# Patient Record
Sex: Male | Born: 1945 | Race: White | Hispanic: No | Smoking: Current some day smoker
Health system: Southern US, Community
[De-identification: ages and names within clinical notes are randomized; demographics above are authoritative.]

## PROBLEM LIST (undated history)

## (undated) DIAGNOSIS — F419 Anxiety disorder, unspecified: Secondary | ICD-10-CM

## (undated) DIAGNOSIS — E785 Hyperlipidemia, unspecified: Secondary | ICD-10-CM

## (undated) DIAGNOSIS — F32A Depression, unspecified: Secondary | ICD-10-CM

## (undated) DIAGNOSIS — F329 Major depressive disorder, single episode, unspecified: Secondary | ICD-10-CM

## (undated) DIAGNOSIS — H9190 Unspecified hearing loss, unspecified ear: Secondary | ICD-10-CM

## (undated) DIAGNOSIS — I10 Essential (primary) hypertension: Secondary | ICD-10-CM

## (undated) HISTORY — DX: Essential (primary) hypertension: I10

## (undated) HISTORY — DX: Hyperlipidemia, unspecified: E78.5

## (undated) HISTORY — PX: OTHER SURGICAL HISTORY: SHX169

## (undated) HISTORY — PX: CHOLECYSTECTOMY: SHX55

## (undated) HISTORY — PX: SPLENECTOMY: SUR1306

## (undated) HISTORY — PX: HERNIA REPAIR: SHX51

## (undated) HISTORY — PX: COLONOSCOPY: SHX174

---

## 1998-03-27 ENCOUNTER — Encounter: Admission: RE | Admit: 1998-03-27 | Discharge: 1998-03-27 | Payer: Self-pay | Admitting: Sports Medicine

## 1998-06-08 ENCOUNTER — Encounter: Admission: RE | Admit: 1998-06-08 | Discharge: 1998-06-08 | Payer: Self-pay | Admitting: Family Medicine

## 1999-03-20 ENCOUNTER — Encounter: Admission: RE | Admit: 1999-03-20 | Discharge: 1999-03-20 | Payer: Self-pay | Admitting: Family Medicine

## 1999-04-18 ENCOUNTER — Encounter: Admission: RE | Admit: 1999-04-18 | Discharge: 1999-04-18 | Payer: Self-pay | Admitting: Family Medicine

## 2000-10-14 ENCOUNTER — Encounter: Payer: Self-pay | Admitting: Emergency Medicine

## 2000-10-14 ENCOUNTER — Inpatient Hospital Stay (HOSPITAL_COMMUNITY): Admission: EM | Admit: 2000-10-14 | Discharge: 2000-10-18 | Payer: Self-pay | Admitting: Internal Medicine

## 2000-10-17 ENCOUNTER — Encounter: Payer: Self-pay | Admitting: Internal Medicine

## 2004-03-03 ENCOUNTER — Emergency Department (HOSPITAL_COMMUNITY): Admission: EM | Admit: 2004-03-03 | Discharge: 2004-03-03 | Payer: Self-pay | Admitting: *Deleted

## 2005-01-13 ENCOUNTER — Ambulatory Visit: Payer: Self-pay | Admitting: Internal Medicine

## 2005-01-23 ENCOUNTER — Ambulatory Visit: Payer: Self-pay

## 2005-02-13 ENCOUNTER — Ambulatory Visit: Payer: Self-pay | Admitting: Internal Medicine

## 2007-04-05 ENCOUNTER — Ambulatory Visit: Payer: Self-pay | Admitting: Internal Medicine

## 2007-04-05 LAB — CONVERTED CEMR LAB
BUN: 9 mg/dL (ref 6–23)
Basophils Absolute: 0 10*3/uL (ref 0.0–0.1)
Basophils Relative: 0.1 % (ref 0.0–1.0)
CO2: 35 meq/L — ABNORMAL HIGH (ref 19–32)
Calcium: 9.2 mg/dL (ref 8.4–10.5)
Chloride: 101 meq/L (ref 96–112)
Cholesterol: 221 mg/dL (ref 0–200)
Creatinine, Ser: 1.1 mg/dL (ref 0.4–1.5)
Direct LDL: 138.1 mg/dL
Eosinophils Absolute: 0.4 10*3/uL (ref 0.0–0.6)
Eosinophils Relative: 3.8 % (ref 0.0–5.0)
GFR calc Af Amer: 88 mL/min
GFR calc non Af Amer: 72 mL/min
Glucose, Bld: 102 mg/dL — ABNORMAL HIGH (ref 70–99)
HCT: 46 % (ref 39.0–52.0)
HDL: 24.9 mg/dL — ABNORMAL LOW (ref >39.0)
Hemoglobin: 15.5 g/dL (ref 13.0–17.0)
Lymphocytes Relative: 29 % (ref 12.0–46.0)
MCHC: 33.6 g/dL (ref 30.0–36.0)
MCV: 89.7 fL (ref 78.0–100.0)
Monocytes Absolute: 1.4 10*3/uL — ABNORMAL HIGH (ref 0.2–0.7)
Monocytes Relative: 13.2 % — ABNORMAL HIGH (ref 3.0–11.0)
Neutro Abs: 5.7 10*3/uL (ref 1.4–7.7)
Neutrophils Relative %: 53.9 % (ref 43.0–77.0)
Platelets: 398 10*3/uL (ref 150–400)
Potassium: 3.7 meq/L (ref 3.5–5.1)
RBC: 5.13 M/uL (ref 4.22–5.81)
RDW: 13.9 % (ref 11.5–14.6)
Sodium: 143 meq/L (ref 135–145)
TSH: 0.77 microintl units/mL (ref 0.35–5.50)
Total CHOL/HDL Ratio: 8.9
Triglycerides: 248 mg/dL (ref 0–149)
VLDL: 50 mg/dL — ABNORMAL HIGH (ref 0–40)
WBC: 10.6 10*3/uL — ABNORMAL HIGH (ref 4.5–10.5)

## 2007-10-30 ENCOUNTER — Emergency Department (HOSPITAL_COMMUNITY): Admission: EM | Admit: 2007-10-30 | Discharge: 2007-10-30 | Payer: Self-pay | Admitting: Emergency Medicine

## 2007-11-05 ENCOUNTER — Ambulatory Visit: Payer: Self-pay | Admitting: Internal Medicine

## 2007-11-19 ENCOUNTER — Ambulatory Visit: Payer: Self-pay

## 2008-08-17 ENCOUNTER — Ambulatory Visit: Payer: Self-pay | Admitting: Internal Medicine

## 2008-09-21 ENCOUNTER — Ambulatory Visit: Payer: Self-pay | Admitting: Internal Medicine

## 2008-09-21 LAB — CONVERTED CEMR LAB
BUN: 13 mg/dL (ref 6–23)
CO2: 34 meq/L — ABNORMAL HIGH (ref 19–32)
Calcium: 8.8 mg/dL (ref 8.4–10.5)
Chloride: 99 meq/L (ref 96–112)
Creatinine, Ser: 1.3 mg/dL (ref 0.4–1.5)
GFR calc Af Amer: 72 mL/min
GFR calc non Af Amer: 59 mL/min
Glucose, Bld: 95 mg/dL (ref 70–99)
Potassium: 3.8 meq/L (ref 3.5–5.1)
Sodium: 139 meq/L (ref 135–145)

## 2008-11-30 ENCOUNTER — Ambulatory Visit: Payer: Self-pay | Admitting: Internal Medicine

## 2008-11-30 LAB — CONVERTED CEMR LAB
AST: 18 units/L (ref 0–37)
LDL Cholesterol: 66 mg/dL (ref 0–99)
VLDL: 17 mg/dL (ref 0–40)

## 2009-03-21 ENCOUNTER — Encounter: Payer: Self-pay | Admitting: Internal Medicine

## 2009-05-01 DIAGNOSIS — I428 Other cardiomyopathies: Secondary | ICD-10-CM | POA: Insufficient documentation

## 2009-05-01 DIAGNOSIS — E785 Hyperlipidemia, unspecified: Secondary | ICD-10-CM | POA: Insufficient documentation

## 2009-05-01 DIAGNOSIS — I10 Essential (primary) hypertension: Secondary | ICD-10-CM

## 2009-05-03 ENCOUNTER — Ambulatory Visit: Payer: Self-pay | Admitting: Internal Medicine

## 2009-08-27 ENCOUNTER — Encounter (INDEPENDENT_AMBULATORY_CARE_PROVIDER_SITE_OTHER): Payer: Self-pay | Admitting: *Deleted

## 2009-11-23 ENCOUNTER — Ambulatory Visit: Payer: Self-pay | Admitting: Internal Medicine

## 2009-11-23 DIAGNOSIS — F17200 Nicotine dependence, unspecified, uncomplicated: Secondary | ICD-10-CM

## 2009-12-10 LAB — CONVERTED CEMR LAB
Chloride: 101 meq/L (ref 96–112)
HCT: 49.1 % (ref 39.0–52.0)
HDL: 34 mg/dL — ABNORMAL LOW (ref >39)
LDL Cholesterol: 49 mg/dL (ref 0–99)
Lymphocytes Relative: 37 % (ref 12–46)
Lymphs Abs: 4 10*3/uL (ref 0.7–4.0)
Neutrophils Relative %: 44 % (ref 43–77)
Platelets: 370 10*3/uL (ref 150–400)
Potassium: 4.8 meq/L (ref 3.5–5.3)
Sodium: 142 meq/L (ref 135–145)
Triglycerides: 257 mg/dL — ABNORMAL HIGH (ref <150)
VLDL: 51 mg/dL — ABNORMAL HIGH (ref 0–40)
WBC: 10.8 10*3/uL — ABNORMAL HIGH (ref 4.0–10.5)

## 2009-12-27 ENCOUNTER — Telehealth: Payer: Self-pay | Admitting: Internal Medicine

## 2010-03-29 ENCOUNTER — Encounter (INDEPENDENT_AMBULATORY_CARE_PROVIDER_SITE_OTHER): Payer: Self-pay | Admitting: *Deleted

## 2010-07-11 ENCOUNTER — Ambulatory Visit: Payer: Self-pay | Admitting: Internal Medicine

## 2010-11-03 ENCOUNTER — Encounter: Payer: Self-pay | Admitting: Internal Medicine

## 2010-11-10 LAB — CONVERTED CEMR LAB
Albumin: 4.4 g/dL (ref 3.5–5.2)
BUN: 13 mg/dL (ref 6–23)
Bilirubin, Direct: 0.2 mg/dL (ref 0.0–0.3)
CO2: 30 meq/L (ref 19–32)
Calcium: 8.9 mg/dL (ref 8.4–10.5)
Creatinine, Ser: 1.2 mg/dL (ref 0.4–1.5)
Digitoxin Lvl: 0.2 ng/mL — ABNORMAL LOW (ref 0.8–2.0)
Eosinophils Relative: 3.3 % (ref 0.0–5.0)
HCT: 48.9 % (ref 39.0–52.0)
Lymphs Abs: 2.4 10*3/uL (ref 0.7–4.0)
MCV: 94.3 fL (ref 78.0–100.0)
Monocytes Absolute: 1.1 10*3/uL — ABNORMAL HIGH (ref 0.1–1.0)
Neutro Abs: 5.7 10*3/uL (ref 1.4–7.7)
Platelets: 306 10*3/uL (ref 150.0–400.0)
RDW: 14.5 % (ref 11.5–14.6)
TSH: 0.78 microintl units/mL (ref 0.35–5.50)
Total Protein: 7.6 g/dL (ref 6.0–8.3)
WBC: 9.5 10*3/uL (ref 4.5–10.5)

## 2010-11-12 NOTE — Progress Notes (Signed)
Summary: dx code from 11-23-2009   Phone Note From Other Clinic   Caller: April from [place] office [phone] ext [phone] Request: Talk with Nurse Details for Reason: need dx code from 11-23-09 for tsh, lipid.  Initial call taken by: Neil Crouch,  December 27, 2009 8:43 AM  Follow-up for Phone Call        [facility] with dx codes. Follow-up by: Georgetta Haber RN

## 2010-11-12 NOTE — Letter (Signed)
Summary: Appointment - Rem[facility] [place], [place]  [address] [place]   [place], [place] [address]   Phone: [phone]  Fax: [phone]     March 29, 2010 MRN: [id number]   ANQUAN GORELICK [place], [place]  [address]   Dear Mr. MOUNGER,  [facility] records indicate that it is time to schedule a follow-up appointment with Dr. Harrington Challenger in August. It is very important that we reach you to schedule this appointment. We look forward to participating in your health care needs. Please contact us at the number listed above at your earliest convenience to schedule your appointment.  If you are unable to make an appointment at this time, give [place] a call so we can update our records.     Sincerely,    Darnell Level [facility] Scheduling Team
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
## 2010-11-12 NOTE — Miscellaneous (Signed)
Summary: Orders Update  Clinical Lists Changes  Orders: Added new Test order of T-Basic Metabolic Panel (99991111) - Signed Added new Test order of T-Lipid Profile [phone]) - Signed Added new Test order of T-CBC w/Diff [phone]) - Signed Added new Test order of T-TSH [phone]) - Signed Added new Test order of T-Digoxin [id number]) - Signed Added new Test order of T-AST/SGOT [id number]) - Signed

## 2010-11-12 NOTE — Assessment & Plan Note (Signed)
Summary: 6 MONTH ROV/SL  Medications Added CARVEDILOL 25 MG TABS (CARVEDILOL) 1 tablet 2 times per day      Allergies Added: NKDA  Visit Type:  Follow-up Primary Provider:  none  CC:  no complaints. Pt states he is feeling great will talk with Dr Harrington Challenger first before having a ekg today.  History of Present Illness: Kristopher [name] is a 65 year old with a history of NICM, dyslipidemia, hypertension.  I last saw him in July.   Since seen, he denies chst pain.  breathing is OK.  He denies dizziness.  Current Medications (verified): 1)  Klor-Con 20 Meq Pack (Potassium Chloride) .Marland Kitchen.. 1 Tab Once Daily 2)  Carvedilol 12.5 Mg Tabs (Carvedilol) .... Take One Tablet By Mouth Twice A Day 3)  Furosemide 40 Mg Tabs (Furosemide) .... Take One Tablet By Mouth Daily. 4)  Lanoxin 0.125 Mg Tabs (Digoxin) .... Once Daily 5)  Meclizine Hcl 25 Mg Tabs (Meclizine Hcl) .... For Inner Ear 6)  Lisinopril 20 Mg Tabs (Lisinopril) .... Take One Tablet By Mouth Daily 7)  Zocor 40 Mg Tabs (Simvastatin) .Marland Kitchen.. 1 By Mouth Every Hs 8)  Alprazolam 0.25 Mg Tabs (Alprazolam) .Marland Kitchen.. 1 Two Times A Day As Needed Anxiety  Allergies (verified): No Known Drug Allergies  Past History:  Past Medical History: Last updated: 05/01/2009 ,Current Problems:  DYSLIPIDEMIA (ICD-272.4) CARDIOMYOPATHY (ICD-425.4) HYPERTENSION (ICD-401.9)  Social History: Last updated: 05/03/2009 Positive for tobacco use.  Vital Signs:  Patient profile:   65 year old male Height:      71 inches Weight:      141 pounds Pulse rate:   68 / minute BP sitting:   140 / 71  (left arm) Cuff size:   regular  Vitals Entered By: Lubertha Basque, CNA (November 23, 2009 3:30 PM)  Physical Exam  Additional Exam:  patient is a thin 65 year old in NAD HEENT:  Normocephalic, atraumatic. EOMI, PERRLA.  Neck: JVP is normal. No thyromegaly. No bruits.  Lungs: clear to auscultation. No rales no wheezes.  Heart: Regular rate and rhythm. Normal S1, S2. No S3.    No significant murmurs. PMI not displaced.  Abdomen:  Supple, nontender. Normal bowel sounds. No masses. No hepatomegaly.  Extremities:   Good distal pulses throughout. No lower extremity edema.  Musculoskeletal :moving all extremities.  Neuro:   alert and oriented x3.    Impression & Recommendations:  Problem # 1:  CARDIOMYOPATHY (ICD-425.4) Volume status looks good.  BP is a little higher today.  I would check labs.  i would also incrase Coreg to 25 two times a day. The following medications were removed from the medication list:    Carvedilol 12.5 Mg Tabs (Carvedilol) .Marland Kitchen... Take one tablet by mouth twice a day His updated medication list for this problem includes:    Furosemide 40 Mg Tabs (Furosemide) .Marland Kitchen... Take one tablet by mouth daily.    Lanoxin 0.125 Mg Tabs (Digoxin) ..... Once daily    Lisinopril 20 Mg Tabs (Lisinopril) .Marland Kitchen... Take one tablet by mouth daily    Carvedilol 25 Mg Tabs (Carvedilol) .Marland Kitchen... 1 tablet 2 times per day  Problem # 2:  HYPERTENSION (ICD-401.9) AS noted above. The following medications were removed from the medication list:    Carvedilol 12.5 Mg Tabs (Carvedilol) .Marland Kitchen... Take one tablet by mouth twice a day His updated medication list for this problem includes:    Furosemide 40 Mg Tabs (Furosemide) .Marland Kitchen... Take one tablet by mouth daily.  Lisinopril 20 Mg Tabs (Lisinopril) .Marland Kitchen... Take one tablet by mouth daily    Carvedilol 25 Mg Tabs (Carvedilol) .Marland Kitchen... 1 tablet 2 times per day  Problem # 3:  DYSLIPIDEMIA (ICD-272.4) Continue.  Set nonfasting.   His updated medication list for this problem includes:    Zocor 40 Mg Tabs (Simvastatin) .Marland Kitchen... 1 by mouth every hs  Problem # 4:  TOBACCO ABUSE (ICD-305.1) Counselled.  Patient Instructions: 1)  Your physician recommends that you return for lab work in:lab work today..will call you with results 2)  Your physician has recommended you make the following change in your medication: increase Coreg to 25mg  two times  per day 3)  Your physician wants you to follow-up in: 6 months  You will receive a reminder letter in the mail two months in advance. If you don't receive a letter, please call our office to schedule the follow-up appointment. Prescriptions: CARVEDILOL 25 MG TABS (CARVEDILOL) 1 tablet 2 times per day  #60 x 6   Entered by:   Francetta Found, RN, BSN   Authorized by:   Annye Rusk, MD, [facility]   Signed by:   Francetta Found, RN, BSN on 11/23/2009   Method used:   Electronically to        [facility] [phone]* (retail)       [address]       [place], [place]  [address]       Ph: [phone] or [phone]       Fax: [phone]   RxID:   [id number]

## 2011-01-23 ENCOUNTER — Other Ambulatory Visit: Payer: Self-pay | Admitting: Internal Medicine

## 2011-01-24 ENCOUNTER — Other Ambulatory Visit: Payer: Self-pay | Admitting: *Deleted

## 2011-01-24 MED ORDER — FUROSEMIDE 40 MG PO TABS: 40.0000 mg | ORAL_TABLET | Freq: Every day | ORAL | Status: DC

## 2011-02-25 NOTE — Assessment & Plan Note (Signed)
[place]                            CARDIOLOGY OFFICE NOTE   RAHKIM, RINGGENBERG                       MRN:          [id number]  DATE:08/17/2008                            DOB:          06-03-1946    IDENTIFICATION:  Mr. Gallian is a 65 year old gentleman I saw him back in  January 2009.  He has a history of nonischemic cardiomyopathy with mild  LV dysfunction (45%), dyslipidemia, hypertension, tobacco use.   When I saw him last, he had severe dizziness.  See this for full  details.  I scheduled an MRI, but he could not lay flat.  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   In the interval, the patient has been seen in ENT.  His symptoms of  dizziness went away, but he can no longer hear in his left year.  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   Otherwise, his breathing is about the same.  He denies chest pain.   CURRENT MEDICINES:  1. Furosemide 40.  2. Altace 10.  3. Digitek 0.125.  4. Klor-Con 20.  5. Coreg 12.5 b.i.d.   PHYSICAL EXAMINATION:  GENERAL:  The patient is in no distress.  VITAL SIGNS:  Blood pressure 141/85, pulse is 81 and regular, weight 144  and relatively stable.  LUNGS:  Clear.  CARDIAC:  Regular rate and rhythm, S1 and S2.  No S3, no murmurs.  ABDOMEN:  Benign.  EXTREMITIES:  No edema.   IMPRESSION:  1. Hypertension, fair.  He is complaining about the cost of the      medicines, so I switched him to lisinopril 20.  I will follow up      when he starts this.  Continue other medicines.  Again, they will      follow up and switch to [facility] for a lower price.  2. Unsteadiness, improved though with hearing loss.  He does not have      a followup for ENT.  3. Health care maintenance.  With his cerebrovascular disease, this      will need to be followed plus I will start him on Zocor and      followup a  Lipomed AST.  4. Health care maintenance.  Counseled on tobacco.   I will set him up for a BMET once he starts the medicines.  Again, I  will see him in March.     [name], MD, [facility]  Electronically Signed    PVR/MedQ  DD: 08/17/2008  DT: 08/18/2008  Job #: [id number]

## 2011-02-25 NOTE — Assessment & Plan Note (Signed)
[place]                            CARDIOLOGY OFFICE NOTE   SHYLER, WEMPE                       MRN:          [id number]  DATE:11/05/2007                            DOB:          [birth year]    IDENTIFICATION:  The patient is a 65 year old gentleman.  I last saw him  back in June, it looks like.  He has a history of nonischemic  cardiomyopathy, LV function of about 45%, also dyslipidemia, has not  been on therapy.  He comes in today.  He said earlier this month on  January 17 he went to the hospital's ER for severe dizziness.  He was  diagnosed with vertigo and sent home.  However, after that time that  severe spinning resolved some but he still gets very lightheaded with  moving, actually has to have assistance to get around.  He also says he  cannot hear out of his left ear.   His breathing otherwise has been okay.  No chest pain.   CURRENT MEDICATIONS:  Include:  1. Altace 10.  2. Lasix 40.  3. Digitek 0.125.  4. Klor-Con 20.  5. OTC nausea medicine.  6. Coreg 12.5 b.i.d.  7. Meclizine p.r.n.   PHYSICAL EXAM:  The patient is in no distress on arrival.  Blood  pressure 160/77, pulse is 71.  Orthostatics:  Blood pressure lying  145/72, pulse 70; sitting, 162/74, pulse 70; standing at 0 minutes  170/80, 70 pulse, a little dizzy; 2 minutes, 167/83, pulse 73, dizzy;  standing at 5 minutes, 172/81, dizzy, pulse 71.  LUNGS:  Clear.  NECK:  JVP appears flat.  CARDIAC EXAM:  Regular rate and rhythm.  S1, S2, no murmurs.  ABDOMEN:  Benign.  EXTREMITIES:  No significant edema.   IMPRESSION:  1. Unsteadiness, dizziness, hearing loss.  I am not sure what this      represents.  I have looked in his ear, actually forgot in the      physical exam it is clear.  There is not bulging of the tympanic      membrane.  He has no nystagmus.  Romberg sign is negative.  I did      not test his gait though the wife says he just cannot move around  well without support.  He came in in a wheelchair.  I am going set      the patient up for an MRI/MRA to make sure he has not had a stroke.      Once this is done, if negative, I will talk with ENT.  Continue on      the same regimen.  2. Hypertension.  The patient actually has the opposite of orthostasis      with hypertension.  Again, with the uncertainty of what is going on      I do not want to lower his blood pressure for the short-term.  I      will keep him on this and address that after the MRI findings.   I would like to set followup for a few weeks.  Again, I will be in touch  with him regarding the MRI.   ADDENDUM:  A 12-lead EKG:  Normal sinus rhythm, left bundle-branch  block, unchanged.     [name], MD, [facility]  Electronically Signed    PVR/MedQ  DD: 11/05/2007  DT: 11/06/2007  Job #: [id number]

## 2011-02-25 NOTE — Assessment & Plan Note (Signed)
[place]                            CARDIOLOGY OFFICE NOTE   Kristopher [name]                       MRN:          [id number]  DATE:04/05/2007                            DOB:          08/22/1946    IDENTIFICATION:  Kristopher [name] is a 65 year old gentleman with a history of  nonischemic cardiomyopathy, left ventricular function on echo done back  in 2006 was 45%.  Also, dyslipidemia with an LDL of 156 not on therapy.   He was last seen in May of 2006.  He says his breathing has been okay.  No chest pain, no PND.  Appetite okay.  Denies dizziness.   CURRENT MEDICATIONS:  1. Altace 10.  2. Lasix 40.  3. Klor-Con 20.  4. Lanoxin 0.125 daily.   PHYSICAL EXAMINATION:  GENERAL:  On exam, the patient is in no distress.  VITAL SIGNS:  Blood pressure 146/88, pulse 74 and regular, weight 139  (down from 148).  NECK:  JVP is normal.  LUNGS:  Clear to auscultation.  CARDIAC:  Regular rate and rhythm.  S1 and S2.  No S3, no murmurs.  ABDOMEN:  No hepatomegaly.  EXTREMITIES:  No edema.   Chest x-ray shows kyphosis.  Otherwise, no acute disease.   IMPRESSION:  1. Nonischemic cardiomyopathy.  Volume status looks good.  Mild left      ventricular dysfunction.  I am not sure he was taken off the Coreg.      I do not have it as a record in my note.  I would resume.  Continue      other medicines.  2. Health care maintenance checked.  A CBC, TSH, BMET and non-fasting      lipid today.  Will be in touch with him with the results.   The patient will call if he has any problems with the Coreg; otherwise,  I will set to see him in 4 months, sooner if problems develop.   ADDENDUM:  The patient has informed me he has no primary care physician.  I think he should follow up with one for his primary health care needs.  Will discuss on return.     [name], MD, [facility]  Electronically Signed   PVR/MedQ  DD: 04/05/2007  DT: 04/06/2007  Job #: [id number]
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
## 2011-03-16 ENCOUNTER — Other Ambulatory Visit: Payer: Self-pay | Admitting: Internal Medicine

## 2011-04-07 ENCOUNTER — Encounter: Payer: Self-pay | Admitting: Internal Medicine

## 2011-04-14 ENCOUNTER — Ambulatory Visit (INDEPENDENT_AMBULATORY_CARE_PROVIDER_SITE_OTHER): Payer: Medicare Other | Admitting: Internal Medicine

## 2011-04-14 ENCOUNTER — Encounter: Payer: Self-pay | Admitting: Internal Medicine

## 2011-04-14 VITALS — BP 90/57 | HR 74 | Ht 70.0 in | Wt 127.0 lb

## 2011-04-14 DIAGNOSIS — R630 Anorexia: Secondary | ICD-10-CM

## 2011-04-14 DIAGNOSIS — E785 Hyperlipidemia, unspecified: Secondary | ICD-10-CM

## 2011-04-14 DIAGNOSIS — I509 Heart failure, unspecified: Secondary | ICD-10-CM

## 2011-04-14 DIAGNOSIS — I10 Essential (primary) hypertension: Secondary | ICD-10-CM

## 2011-04-14 DIAGNOSIS — I428 Other cardiomyopathies: Secondary | ICD-10-CM

## 2011-04-14 MED ORDER — MEGESTROL ACETATE 20 MG PO TABS: 20.0000 mg | ORAL_TABLET | Freq: Every day | ORAL | Status: AC

## 2011-04-14 NOTE — Patient Instructions (Signed)
Bmet today. we will call you with results.  Your physician wants you to follow-up in:6 to 8 months with Dr.Ross You will receive a reminder letter in the mail two months in advance. If you don't receive a letter, please call our office to schedule the follow-up appointment.

## 2011-04-14 NOTE — Progress Notes (Signed)
HPI Mr. Sollazzo is a 65 year old with a history of NICM (mild LV dysfunction), dyslipidemia, hypertension.  I last saw him in    Since seen, he denies chst pain.  breathing is OK.  He denies dizziness. He says he is feeling very good.  But he has not appetite.  Has lost weight.  Now 127 #   Current Outpatient Prescriptions  Medication Sig Dispense Refill  . ALPRAZolam (XANAX) 0.25 MG tablet Take 0.25 mg by mouth 2 (two) times daily as needed.        . carvedilol (COREG) 25 MG tablet Take 25 mg by mouth 2 (two) times daily.        . digoxin (LANOXIN) 0.125 MG tablet Take 125 mcg by mouth daily.        . furosemide (LASIX) 40 MG tablet TAKE  ONE TABLET BY MOUTH EVERY MORNING FOR FLUID RETENTION  90 tablet  1  . lisinopril (PRINIVIL,ZESTRIL) 20 MG tablet TAKE ONE TABLET BY MOUTH EVERY DAY FOR BLOOD PRESSURE  90 tablet  1  . meclizine (ANTIVERT) 25 MG tablet Take 25 mg by mouth.        . potassium chloride (KLOR-CON) 20 MEQ packet Take 20 mEq by mouth daily.        . simvastatin (ZOCOR) 20 MG tablet Take 20 mg by mouth at bedtime.          Past Medical History  Diagnosis Date  . Dyslipidemia   . Cardiomyopathy   . HTN (hypertension)     No past surgical history on file.  No family history on file.  History   Social History  . Marital Status: Divorced    Spouse Name: N/A    Number of Children: N/A  . Years of Education: N/A   Occupational History  . Not on file.   Social History Main Topics  . Smoking status: Current Everyday Smoker  . Smokeless tobacco: Not on file   Comment: positive for tobacco abuse   . Alcohol Use: Not on file  . Drug Use: Not on file  . Sexually Active: Not on file   Other Topics Concern  . Not on file   Social History Narrative  . No narrative on file    Review of Systems:  All systems reviewed.  They are negative to the above problem except as previously stated.  Vital Signs: BP 90/57  Pulse 74  Ht 5\' 10"  (1.778 m)  Wt 127 lb (57.607 kg)   BMI 18.22 kg/m2  Physical Exam  HEENT:  Normocephalic, atraumatic. EOMI, PERRLA.  Neck: JVP is normal. No thyromegaly. No bruits.  Lungs: clear to auscultation. No rales no wheezes.  Heart: Regular rate and rhythm. Normal S1, S2. No S3.   No significant murmurs. PMI not displaced.  Abdomen:  Supple, nontender. Normal bowel sounds. No masses. No hepatomegaly.  Extremities:   Good distal pulses throughout. No lower extremity edema.  Musculoskeletal :moving all extremities.  Neuro:   alert and oriented x3.  CN II-XII grossly intact.   Assessment and Plan:

## 2011-04-15 LAB — BASIC METABOLIC PANEL
BUN: 24 mg/dL — ABNORMAL HIGH (ref 6–23)
CO2: 31 mEq/L (ref 19–32)
Chloride: 104 mEq/L (ref 96–112)
GFR: 45.66 mL/min — ABNORMAL LOW (ref >60.00)
Glucose, Bld: 99 mg/dL (ref 70–99)
Potassium: 3.7 mEq/L (ref 3.5–5.1)

## 2011-04-17 DIAGNOSIS — R630 Anorexia: Secondary | ICD-10-CM | POA: Insufficient documentation

## 2011-04-17 NOTE — Assessment & Plan Note (Signed)
WIll need to be followed.

## 2011-04-17 NOTE — Assessment & Plan Note (Signed)
Patient feels good but has no appetite.  Labs last fall with normal. No other systemic complatins.  Will give trial of megace to see if helps symptoms.

## 2011-04-17 NOTE — Assessment & Plan Note (Signed)
Volume status looks good.  Continue.

## 2011-04-17 NOTE — Assessment & Plan Note (Signed)
Continue meds. 

## 2011-05-08 ENCOUNTER — Encounter: Payer: Self-pay | Admitting: *Deleted

## 2011-05-14 ENCOUNTER — Telehealth: Payer: Self-pay | Admitting: Internal Medicine

## 2011-05-14 NOTE — Telephone Encounter (Signed)
Pt mailbox is full, unable to leave message Kristopher [name]

## 2011-05-14 NOTE — Telephone Encounter (Signed)
Per pt calling regarding setting up appt to schedule blood work. Pt was told he needed to get blood drawn and wanted to set up an appt. Please return pt call to advise/discuss.

## 2011-05-15 ENCOUNTER — Other Ambulatory Visit: Payer: Self-pay | Admitting: *Deleted

## 2011-05-15 DIAGNOSIS — N289 Disorder of kidney and ureter, unspecified: Secondary | ICD-10-CM

## 2011-05-19 NOTE — Telephone Encounter (Signed)
Unable to leave message as voicemail is full. Horton Chin RN

## 2011-05-19 NOTE — Telephone Encounter (Signed)
Unable to leave message on voicemail.  Mailbox is full. Horton Chin RN

## 2011-05-20 ENCOUNTER — Other Ambulatory Visit (INDEPENDENT_AMBULATORY_CARE_PROVIDER_SITE_OTHER): Payer: Medicare Other | Admitting: *Deleted

## 2011-05-20 DIAGNOSIS — N289 Disorder of kidney and ureter, unspecified: Secondary | ICD-10-CM

## 2011-05-20 LAB — BASIC METABOLIC PANEL
BUN: 23 mg/dL (ref 6–23)
GFR: 59.91 mL/min — ABNORMAL LOW (ref >60.00)
Potassium: 3.7 mEq/L (ref 3.5–5.1)
Sodium: 143 mEq/L (ref 135–145)

## 2011-05-20 NOTE — Telephone Encounter (Signed)
Lab work drawn today. Will follow up after we receive results.

## 2011-05-20 NOTE — Telephone Encounter (Signed)
Will forward to [place], RN for Dr. Harrington Challenger.

## 2011-05-21 NOTE — Telephone Encounter (Signed)
Patient should be set up for fasting lipids.  Will also nee periodic BMETs in the future.

## 2011-05-26 ENCOUNTER — Encounter: Payer: Self-pay | Admitting: *Deleted

## 2011-05-26 NOTE — Telephone Encounter (Signed)
Letter sent to patients home

## 2011-06-12 ENCOUNTER — Other Ambulatory Visit: Payer: Self-pay | Admitting: Internal Medicine

## 2011-09-06 ENCOUNTER — Other Ambulatory Visit: Payer: Self-pay | Admitting: Internal Medicine

## 2011-10-15 ENCOUNTER — Other Ambulatory Visit: Payer: Self-pay | Admitting: Internal Medicine

## 2011-11-28 ENCOUNTER — Encounter: Payer: Self-pay | Admitting: Internal Medicine

## 2011-11-28 ENCOUNTER — Ambulatory Visit (INDEPENDENT_AMBULATORY_CARE_PROVIDER_SITE_OTHER): Payer: Medicare Other | Admitting: Internal Medicine

## 2011-11-28 DIAGNOSIS — E782 Mixed hyperlipidemia: Secondary | ICD-10-CM

## 2011-11-28 DIAGNOSIS — I10 Essential (primary) hypertension: Secondary | ICD-10-CM

## 2011-11-28 DIAGNOSIS — Z0000 Encounter for general adult medical examination without abnormal findings: Secondary | ICD-10-CM

## 2011-11-28 DIAGNOSIS — E785 Hyperlipidemia, unspecified: Secondary | ICD-10-CM

## 2011-11-28 DIAGNOSIS — I509 Heart failure, unspecified: Secondary | ICD-10-CM

## 2011-11-28 DIAGNOSIS — R630 Anorexia: Secondary | ICD-10-CM

## 2011-11-28 DIAGNOSIS — F17200 Nicotine dependence, unspecified, uncomplicated: Secondary | ICD-10-CM

## 2011-11-28 NOTE — Progress Notes (Addendum)
HPI Patient is a 66 yr old with a history of NICM (mild LV dysfunction), dyslipidemia, HTN.  I saw him in July. When I saw him he had been losing weight, appetite was down.  I added Megace to his regimen.   Since seen he has felt good.  Appetite is better. He denies SOB.  NO CP.  No Known Allergies  Current Outpatient Prescriptions  Medication Sig Dispense Refill  . ALPRAZolam (XANAX) 0.25 MG tablet Take 0.25 mg by mouth 2 (two) times daily as needed.        . carvedilol (COREG) 25 MG tablet TAKE ONE TABLET BY MOUTH TWICE DAILY  60 tablet  5  . digoxin (LANOXIN) 0.125 MG tablet TAKE ONE TABLET BY MOUTH DAILY  90 tablet  2  . furosemide (LASIX) 40 MG tablet TAKE  ONE TABLET BY MOUTH EVERY MORNING FOR FLUID RETENTION  90 tablet  2  . KLOR-CON M20 20 MEQ tablet TAKE ONE TABLET BY MOUTH DAILY  90 tablet  2  . lisinopril (PRINIVIL,ZESTRIL) 20 MG tablet TAKE ONE TABLET BY MOUTH EVERY DAY FOR BLOOD PRESSURE  90 tablet  0  . meclizine (ANTIVERT) 25 MG tablet Take 25 mg by mouth.        . potassium chloride (KLOR-CON) 20 MEQ packet Take 20 mEq by mouth daily.        . simvastatin (ZOCOR) 20 MG tablet TAKE ONE TABLET BY MOUTH NIGHTLY AT BEDTIME  90 tablet  3    Past Medical History  Diagnosis Date  . Dyslipidemia   . Cardiomyopathy   . HTN (hypertension)     No past surgical history on file.  No family history on file.  History   Social History  . Marital Status: Divorced    Spouse Name: N/A    Number of Children: N/A  . Years of Education: N/A   Occupational History  . Not on file.   Social History Main Topics  . Smoking status: Current Everyday Smoker  . Smokeless tobacco: Not on file   Comment: positive for tobacco abuse   . Alcohol Use: Not on file  . Drug Use: Not on file  . Sexually Active: Not on file   Other Topics Concern  . Not on file   Social History Narrative  . No narrative on file    Review of Systems:  All systems reviewed.  They are negative to the  above problem except as previously stated.  Vital Signs: BP 132/72  Pulse 64  Ht 5\' 7"  (1.702 m)  Wt 141 lb (63.957 kg)  BMI 22.08 kg/m2  Physical Exam Patient is in NAD. HEENT:  Normocephalic, atraumatic. EOMI, PERRLA.  Neck: JVP is normal. No thyromegaly. No bruits.  Lungs: clear to auscultation. No rales no wheezes.  Heart: Regular rate and rhythm. Normal S1, S2. No S3.   No significant murmurs. PMI not displaced.  Abdomen:  Supple, nontender. Normal bowel sounds. No masses. No hepatomegaly.  Extremities:   Good distal pulses throughout. No lower extremity edema.  Musculoskeletal :moving all extremities.  Neuro:   alert and oriented x3.  CN II-XII grossly intact.  EKG:  Sinus rhythm.  80 bpm.   Assessment and Plan:

## 2011-11-28 NOTE — Patient Instructions (Signed)
Your physician wants you to follow-up in:  November 2013 You will receive a reminder letter in the mail two months in advance. If you don't receive a letter, please call our office to schedule the follow-up appointment.

## 2011-11-29 DIAGNOSIS — Z0000 Encounter for general adult medical examination without abnormal findings: Secondary | ICD-10-CM | POA: Insufficient documentation

## 2011-11-29 NOTE — Assessment & Plan Note (Addendum)
Counselled.  Rare cig.

## 2011-11-29 NOTE — Assessment & Plan Note (Signed)
Improved

## 2011-11-29 NOTE — Assessment & Plan Note (Signed)
WIll set up for fasting lipids.

## 2011-11-29 NOTE — Assessment & Plan Note (Signed)
Set up for CBC and TSH.

## 2011-11-29 NOTE — Assessment & Plan Note (Signed)
Good control  Keep on same regimen.  Will check BMET.

## 2011-12-02 ENCOUNTER — Other Ambulatory Visit (INDEPENDENT_AMBULATORY_CARE_PROVIDER_SITE_OTHER): Payer: Medicare Other

## 2011-12-02 DIAGNOSIS — I509 Heart failure, unspecified: Secondary | ICD-10-CM

## 2011-12-02 DIAGNOSIS — E782 Mixed hyperlipidemia: Secondary | ICD-10-CM

## 2011-12-02 LAB — CBC WITH DIFFERENTIAL/PLATELET
Basophils Relative: 0.4 % (ref 0.0–3.0)
Eosinophils Relative: 4 % (ref 0.0–5.0)
Hemoglobin: 15 g/dL (ref 13.0–17.0)
MCV: 91.3 fl (ref 78.0–100.0)
Monocytes Absolute: 1.2 10*3/uL — ABNORMAL HIGH (ref 0.1–1.0)
Neutro Abs: 8 10*3/uL — ABNORMAL HIGH (ref 1.4–7.7)
Neutrophils Relative %: 67.7 % (ref 43.0–77.0)
RBC: 4.98 Mil/uL (ref 4.22–5.81)
WBC: 11.8 10*3/uL — ABNORMAL HIGH (ref 4.5–10.5)

## 2011-12-02 LAB — BASIC METABOLIC PANEL
BUN: 18 mg/dL (ref 6–23)
CO2: 24 mEq/L (ref 19–32)
Chloride: 103 mEq/L (ref 96–112)
Creatinine, Ser: 1.4 mg/dL (ref 0.4–1.5)
Potassium: 4.3 mEq/L (ref 3.5–5.1)

## 2011-12-02 LAB — LIPID PANEL
Cholesterol: 118 mg/dL (ref 0–200)
LDL Cholesterol: 71 mg/dL (ref 0–99)

## 2011-12-03 NOTE — Progress Notes (Signed)
Addended by: Doug Sou D on: 12/03/2011 03:00 PM   Modules accepted: Orders

## 2011-12-09 ENCOUNTER — Other Ambulatory Visit: Payer: Self-pay | Admitting: Internal Medicine

## 2011-12-10 ENCOUNTER — Other Ambulatory Visit: Payer: Self-pay

## 2011-12-10 MED ORDER — LISINOPRIL 20 MG PO TABS: 20.0000 mg | ORAL_TABLET | Freq: Every day | ORAL | Status: DC

## 2012-04-12 ENCOUNTER — Other Ambulatory Visit: Payer: Self-pay | Admitting: Internal Medicine

## 2012-07-12 ENCOUNTER — Other Ambulatory Visit: Payer: Self-pay | Admitting: Internal Medicine

## 2012-08-13 ENCOUNTER — Telehealth: Payer: Self-pay | Admitting: Internal Medicine

## 2012-08-13 MED ORDER — ALPRAZOLAM 0.25 MG PO TABS: 0.2500 mg | ORAL_TABLET | Freq: Two times a day (BID) | ORAL | Status: DC | PRN

## 2012-08-13 NOTE — Telephone Encounter (Signed)
Pt's dtr calling to say pt's son is dying and pt can't sleep, very depressed and worried, she would like pt to be called or have something called in for him as soon as posssible

## 2012-08-13 NOTE — Telephone Encounter (Signed)
Follow-up:    Patien's daughter is calling in to follow-up on her initial call.  Please call back.

## 2012-08-13 NOTE — Telephone Encounter (Signed)
Give a short Rx of xanax  20 tabs Patient should have an MD that is following through hospice.

## 2012-08-13 NOTE — Telephone Encounter (Signed)
Called Jeani Hawking (patient's daughter) and she wanted to know if we sent in script for Xanax. Advised Dr.Ross has not reviewed yet and I will call her back today after physician review.

## 2012-08-13 NOTE — Telephone Encounter (Signed)
Called patient's daughter and she has advised me that her brother is in Hospice with terminal liver disease and that her father is very upset and can't sleep at night. She is requesting a refill for Xanax to be sent to [facility] on [address]. He does not have a primary care doctor. Advised will ask Dr.Ross and call back.

## 2012-08-13 NOTE — Telephone Encounter (Signed)
Script is for patient, but son is in Hospice. Called [facility] and ordered Xanax .25mg  1 two times per day as needed #20 with no refills.  Left message on Lynn's voice mail with above information.

## 2012-08-19 ENCOUNTER — Inpatient Hospital Stay (HOSPITAL_COMMUNITY)
Admission: EM | Admit: 2012-08-19 | Discharge: 2012-08-22 | DRG: 194 | Disposition: A | Discharge status: Home / Self Care | Payer: Medicare Other | Attending: Internal Medicine | Admitting: Internal Medicine

## 2012-08-19 ENCOUNTER — Encounter (HOSPITAL_COMMUNITY): Payer: Self-pay | Admitting: *Deleted

## 2012-08-19 DIAGNOSIS — E785 Hyperlipidemia, unspecified: Secondary | ICD-10-CM

## 2012-08-19 DIAGNOSIS — I428 Other cardiomyopathies: Secondary | ICD-10-CM

## 2012-08-19 DIAGNOSIS — R630 Anorexia: Secondary | ICD-10-CM

## 2012-08-19 DIAGNOSIS — M549 Dorsalgia, unspecified: Secondary | ICD-10-CM

## 2012-08-19 DIAGNOSIS — I10 Essential (primary) hypertension: Secondary | ICD-10-CM | POA: Diagnosis present

## 2012-08-19 DIAGNOSIS — R112 Nausea with vomiting, unspecified: Secondary | ICD-10-CM | POA: Diagnosis present

## 2012-08-19 DIAGNOSIS — F4321 Adjustment disorder with depressed mood: Secondary | ICD-10-CM | POA: Diagnosis present

## 2012-08-19 DIAGNOSIS — Z79899 Other long term (current) drug therapy: Secondary | ICD-10-CM

## 2012-08-19 DIAGNOSIS — Z0000 Encounter for general adult medical examination without abnormal findings: Secondary | ICD-10-CM

## 2012-08-19 DIAGNOSIS — R197 Diarrhea, unspecified: Secondary | ICD-10-CM

## 2012-08-19 DIAGNOSIS — J189 Pneumonia, unspecified organism: Principal | ICD-10-CM | POA: Diagnosis present

## 2012-08-19 DIAGNOSIS — F17200 Nicotine dependence, unspecified, uncomplicated: Secondary | ICD-10-CM

## 2012-08-19 DIAGNOSIS — Z905 Acquired absence of kidney: Secondary | ICD-10-CM

## 2012-08-19 NOTE — ED Notes (Signed)
Pt here with multiple complaints.  Originally called EMS for R flank pain and nv that started today and diarrhea that started today.  He also c/o chest that started at 1900.  EMS gave 0.4 mg sl nitro with no chest pressure relief and 324 oral asa and 4 mg IV zofran.

## 2012-08-20 ENCOUNTER — Encounter (HOSPITAL_COMMUNITY): Payer: Self-pay | Admitting: *Deleted

## 2012-08-20 ENCOUNTER — Observation Stay (HOSPITAL_COMMUNITY): Payer: Medicare Other

## 2012-08-20 ENCOUNTER — Emergency Department (HOSPITAL_COMMUNITY): Payer: Medicare Other

## 2012-08-20 DIAGNOSIS — M549 Dorsalgia, unspecified: Secondary | ICD-10-CM

## 2012-08-20 DIAGNOSIS — R630 Anorexia: Secondary | ICD-10-CM

## 2012-08-20 DIAGNOSIS — R112 Nausea with vomiting, unspecified: Secondary | ICD-10-CM

## 2012-08-20 DIAGNOSIS — J189 Pneumonia, unspecified organism: Secondary | ICD-10-CM | POA: Diagnosis present

## 2012-08-20 DIAGNOSIS — R197 Diarrhea, unspecified: Secondary | ICD-10-CM

## 2012-08-20 DIAGNOSIS — F4321 Adjustment disorder with depressed mood: Secondary | ICD-10-CM | POA: Diagnosis present

## 2012-08-20 LAB — COMPREHENSIVE METABOLIC PANEL
ALT: 16 U/L (ref 0–53)
AST: 22 U/L (ref 0–37)
Albumin: 2.7 g/dL — ABNORMAL LOW (ref 3.5–5.2)
CO2: 33 mEq/L — ABNORMAL HIGH (ref 19–32)
Chloride: 97 mEq/L (ref 96–112)
GFR calc non Af Amer: 84 mL/min — ABNORMAL LOW (ref >90)
Potassium: 3.7 mEq/L (ref 3.5–5.1)
Sodium: 137 mEq/L (ref 135–145)
Total Bilirubin: 0.3 mg/dL (ref 0.3–1.2)

## 2012-08-20 LAB — URINALYSIS, ROUTINE W REFLEX MICROSCOPIC
Bilirubin Urine: NEGATIVE
Hgb urine dipstick: NEGATIVE
Nitrite: NEGATIVE
Specific Gravity, Urine: 1.024 (ref 1.005–1.030)
pH: 5.5 (ref 5.0–8.0)

## 2012-08-20 LAB — URINE MICROSCOPIC-ADD ON

## 2012-08-20 LAB — DIGOXIN LEVEL: Digoxin Level: 0.4 ng/mL — ABNORMAL LOW (ref 0.8–2.0)

## 2012-08-20 LAB — CBC
Platelets: 569 10*3/uL — ABNORMAL HIGH (ref 150–400)
RBC: 4.6 MIL/uL (ref 4.22–5.81)
WBC: 16 10*3/uL — ABNORMAL HIGH (ref 4.0–10.5)

## 2012-08-20 LAB — PROTIME-INR
INR: 1.01 (ref 0.00–1.49)
Prothrombin Time: 13.2 seconds (ref 11.6–15.2)

## 2012-08-20 LAB — LACTIC ACID, PLASMA: Lactic Acid, Venous: 1.3 mmol/L (ref 0.5–2.2)

## 2012-08-20 MED ORDER — ONDANSETRON HCL 4 MG/2ML IJ SOLN: 4.0000 mg | Freq: Three times a day (TID) | INTRAMUSCULAR | Status: AC | PRN

## 2012-08-20 MED ORDER — CARVEDILOL 25 MG PO TABS
25.0000 mg | ORAL_TABLET | Freq: Two times a day (BID) | ORAL | Status: DC
  Administered 2012-08-20 – 2012-08-22 (×5): 25 mg via ORAL
  Filled 2012-08-20 (×7): qty 1

## 2012-08-20 MED ORDER — ALPRAZOLAM 0.25 MG PO TABS
0.2500 mg | ORAL_TABLET | Freq: Two times a day (BID) | ORAL | Status: DC | PRN
  Administered 2012-08-20 – 2012-08-22 (×5): 0.25 mg via ORAL
  Filled 2012-08-20 (×5): qty 1

## 2012-08-20 MED ORDER — SODIUM CHLORIDE 0.9 % IJ SOLN
3.0000 mL | Freq: Two times a day (BID) | INTRAMUSCULAR | Status: DC
  Administered 2012-08-20: 22:00:00 via INTRAVENOUS
  Administered 2012-08-21: 3 mL via INTRAVENOUS

## 2012-08-20 MED ORDER — FENTANYL CITRATE 0.05 MG/ML IJ SOLN
50.0000 ug | Freq: Once | INTRAMUSCULAR | Status: AC
  Administered 2012-08-20: 50 ug via INTRAVENOUS
  Filled 2012-08-20: qty 2

## 2012-08-20 MED ORDER — POTASSIUM CHLORIDE CRYS ER 20 MEQ PO TBCR
20.0000 meq | EXTENDED_RELEASE_TABLET | Freq: Every day | ORAL | Status: DC
  Administered 2012-08-20 – 2012-08-22 (×3): 20 meq via ORAL
  Filled 2012-08-20 (×4): qty 1

## 2012-08-20 MED ORDER — ONDANSETRON HCL 4 MG/2ML IJ SOLN: 4.0000 mg | Freq: Once | INTRAMUSCULAR | Status: DC

## 2012-08-20 MED ORDER — SODIUM CHLORIDE 0.9 % IJ SOLN: 3.0000 mL | INTRAMUSCULAR | Status: DC | PRN

## 2012-08-20 MED ORDER — ONDANSETRON HCL 4 MG/2ML IJ SOLN
4.0000 mg | Freq: Once | INTRAMUSCULAR | Status: AC
  Administered 2012-08-20: 4 mg via INTRAVENOUS
  Filled 2012-08-20: qty 2

## 2012-08-20 MED ORDER — LISINOPRIL 20 MG PO TABS
20.0000 mg | ORAL_TABLET | Freq: Every day | ORAL | Status: DC
  Filled 2012-08-20: qty 1

## 2012-08-20 MED ORDER — DIGOXIN 125 MCG PO TABS
0.1250 mg | ORAL_TABLET | Freq: Every day | ORAL | Status: DC
  Administered 2012-08-20 – 2012-08-22 (×3): 0.125 mg via ORAL
  Filled 2012-08-20 (×3): qty 1

## 2012-08-20 MED ORDER — POTASSIUM CHLORIDE 20 MEQ PO PACK
20.0000 meq | PACK | Freq: Every day | ORAL | Status: DC
  Filled 2012-08-20: qty 1

## 2012-08-20 MED ORDER — AZITHROMYCIN 500 MG IV SOLR
500.0000 mg | INTRAVENOUS | Status: DC
  Administered 2012-08-21 – 2012-08-22 (×2): 500 mg via INTRAVENOUS
  Filled 2012-08-20 (×2): qty 500

## 2012-08-20 MED ORDER — SIMVASTATIN 20 MG PO TABS
20.0000 mg | ORAL_TABLET | Freq: Every evening | ORAL | Status: DC
Start: 2012-08-20 — End: 2012-08-22
  Administered 2012-08-20 – 2012-08-21 (×2): 20 mg via ORAL
  Filled 2012-08-20 (×3): qty 1

## 2012-08-20 MED ORDER — FENTANYL 25 MCG/HR TD PT72
25.0000 ug | MEDICATED_PATCH | TRANSDERMAL | Status: DC
  Administered 2012-08-20: 25 ug via TRANSDERMAL
  Filled 2012-08-20: qty 1

## 2012-08-20 MED ORDER — MORPHINE SULFATE 4 MG/ML IJ SOLN
4.0000 mg | Freq: Once | INTRAMUSCULAR | Status: AC
  Administered 2012-08-20: 4 mg via INTRAVENOUS
  Filled 2012-08-20: qty 1

## 2012-08-20 MED ORDER — SODIUM CHLORIDE 0.9 % IV SOLN
INTRAVENOUS | Status: DC
  Administered 2012-08-20: 08:00:00 via INTRAVENOUS

## 2012-08-20 MED ORDER — SODIUM CHLORIDE 0.9 % IV SOLN: 250.0000 mL | INTRAVENOUS | Status: AC | PRN

## 2012-08-20 MED ORDER — DEXTROSE 5 % IV SOLN
500.0000 mg | Freq: Once | INTRAVENOUS | Status: AC
  Administered 2012-08-20: 500 mg via INTRAVENOUS
  Filled 2012-08-20: qty 500

## 2012-08-20 MED ORDER — ONDANSETRON HCL 4 MG/2ML IJ SOLN: 4.0000 mg | Freq: Four times a day (QID) | INTRAMUSCULAR | Status: DC | PRN

## 2012-08-20 MED ORDER — IOHEXOL 300 MG/ML  SOLN
80.0000 mL | Freq: Once | INTRAMUSCULAR | Status: AC | PRN
  Administered 2012-08-20: 80 mL via INTRAVENOUS

## 2012-08-20 MED ORDER — DEXTROSE 5 % IV SOLN
1.0000 g | INTRAVENOUS | Status: DC
  Administered 2012-08-21 – 2012-08-22 (×2): 1 g via INTRAVENOUS
  Filled 2012-08-20 (×2): qty 10

## 2012-08-20 MED ORDER — SODIUM CHLORIDE 0.9 % IV SOLN: 250.0000 mL | INTRAVENOUS | Status: DC | PRN

## 2012-08-20 MED ORDER — FUROSEMIDE 20 MG PO TABS
20.0000 mg | ORAL_TABLET | Freq: Every day | ORAL | Status: DC
  Administered 2012-08-21 – 2012-08-22 (×2): 20 mg via ORAL
  Filled 2012-08-20 (×2): qty 1

## 2012-08-20 MED ORDER — MORPHINE SULFATE 2 MG/ML IJ SOLN
2.0000 mg | INTRAMUSCULAR | Status: DC | PRN
  Administered 2012-08-20 – 2012-08-22 (×9): 2 mg via INTRAVENOUS
  Filled 2012-08-20 (×9): qty 1

## 2012-08-20 MED ORDER — DIPHENHYDRAMINE HCL 25 MG PO CAPS
50.0000 mg | ORAL_CAPSULE | Freq: Two times a day (BID) | ORAL | Status: DC
  Administered 2012-08-20 – 2012-08-22 (×5): 50 mg via ORAL
  Filled 2012-08-20 (×7): qty 2

## 2012-08-20 MED ORDER — FUROSEMIDE 40 MG PO TABS
40.0000 mg | ORAL_TABLET | Freq: Every day | ORAL | Status: DC
  Filled 2012-08-20: qty 1

## 2012-08-20 MED ORDER — DEXTROSE 5 % IV SOLN
1.0000 g | Freq: Once | INTRAVENOUS | Status: AC
  Administered 2012-08-20: 1 g via INTRAVENOUS
  Filled 2012-08-20: qty 10

## 2012-08-20 MED ORDER — HYDROCODONE-ACETAMINOPHEN 5-325 MG PO TABS
1.0000 | ORAL_TABLET | Freq: Four times a day (QID) | ORAL | Status: DC | PRN
  Administered 2012-08-20 – 2012-08-22 (×7): 1 via ORAL
  Filled 2012-08-20 (×8): qty 1

## 2012-08-20 NOTE — H&P (Signed)
Chief Complaint:  Fever cough h/v  HPI: 66 yo male with one week of cough and fever and now n/v mostly posttussive.  He just buried his son several days ago from etoh cirrhosis and has been very stressed and not eating well.  Family finally made him come to the ED b/c of the voming and fevers.  Has pna on cxr.  No le edma no cp.  No blood in vomit.  No diarrhea.  Review of Systems:  O/w neg  Past Medical History: Past Medical History  Diagnosis Date  . Dyslipidemia   . Cardiomyopathy   . HTN (hypertension)    History reviewed. No pertinent past surgical history.  Medications: Prior to Admission medications   Medication Sig Start Date End Date Taking? Authorizing Provider  ALPRAZolam (XANAX) 0.25 MG tablet Take 1 tablet (0.25 mg total) by mouth 2 (two) times daily as needed. 08/13/12  Yes [name], MD  carvedilol (COREG) 25 MG tablet Take 25 mg by mouth 2 (two) times daily with a meal.   Yes Historical Provider, MD  digoxin (LANOXIN) 0.125 MG tablet TAKE ONE TABLET BY MOUTH DAILY 07/12/12  Yes [name], MD  diphenhydrAMINE (BENADRYL) 25 MG tablet Take 50 mg by mouth 2 (two) times daily.   Yes Historical Provider, MD  furosemide (LASIX) 40 MG tablet Take 40 mg by mouth daily.   Yes Historical Provider, MD  lisinopril (PRINIVIL,ZESTRIL) 20 MG tablet Take 1 tablet (20 mg total) by mouth daily. 12/10/11  Yes [name], MD  potassium chloride (KLOR-CON) 20 MEQ packet Take 20 mEq by mouth daily.     Yes Historical Provider, MD  Pseudoeph-Doxylamine-DM-APAP (NYQUIL D COLD/FLU) 60-12.03-11-999 MG/30ML LIQD Take 30 mLs by mouth at bedtime as needed. For cough   Yes Historical Provider, MD  Pseudoephedrine-APAP-DM (DAYQUIL MULTI-SYMPTOMJL:[id number] MG/30ML LIQD Take 30 mLs by mouth daily as needed. For cough   Yes Historical Provider, MD  simvastatin (ZOCOR) 20 MG tablet Take 20 mg by mouth every evening.   Yes Historical Provider, MD    Allergies:  No Known Allergies  Social  History:  reports that he has been smoking.  He does not have any smokeless tobacco history on file. His alcohol and drug histories not on file.  Family History: neg  Physical Exam: Filed Vitals:   08/19/12 2354 08/20/12 0004 08/20/12 0138  BP: 127/84  138/41  Pulse: 89  54  Temp: 97.4 F (36.3 C)  97.7 F (36.5 C)  TempSrc: Oral  Oral  Resp: 20  24  Height:  5\' 10"  (1.778 m)   Weight:  56.7 kg (125 lb)   SpO2: 98%  98%   General appearance: alert, cooperative and no distress Neck: no JVD and supple, symmetrical, trachea midline Lungs: clear to auscultation bilaterally Heart: regular rate and rhythm, S1, S2 normal, no murmur, click, rub or gallop Abdomen: soft, non-tender; bowel sounds normal; no masses,  no organomegaly Extremities: extremities normal, atraumatic, no cyanosis or edema Pulses: 2+ and symmetric Skin: Skin color, texture, turgor normal. No rashes or lesions Neurologic: Grossly normal    Labs on Admission:   Basename 08/20/12 0001  NA 137  K 3.7  CL 97  CO2 33*  GLUCOSE 139*  BUN 10  CREATININE 0.97  CALCIUM 9.4  MG --  PHOS --    Basename 08/20/12 0001  AST 22  ALT 16  ALKPHOS 94  BILITOT 0.3  PROT 6.9  ALBUMIN 2.7*  Basename 08/20/12 0025  LIPASE 31  AMYLASE --    Basename 08/20/12 0001  WBC 16.0*  NEUTROABS --  HGB 14.0  HCT 39.3  MCV 85.4  PLT 569*    Basename 08/20/12 0025  CKTOTAL --  CKMB --  CKMBINDEX --  TROPONINI <0.30   Radiological Exams on Admission: Dg Chest Portable 1 View  08/20/2012  *RADIOLOGY REPORT*  Clinical Data: Shortness of breath.  Chest pain.  Nausea.  PORTABLE CHEST - 1 VIEW 08/20/2012 0013 hours:  Comparison: Two-view chest x-ray 07/11/2010, 04/05/2007.  Findings: Cardiac silhouette normal in size for the AP cortical technique and degree of inspiration.  Prominent bronchovascular markings diffusely and central peribronchial thickening, similar prior.  Patchy airspace opacities in the left mid  lung and left base.  Lungs otherwise clear.  Possible small left pleural effusion.  IMPRESSION: Pneumonia involving the left mid lung and left base, superimposed upon baseline changes of COPD.   Original Report Authenticated By: Evangeline Dakin, M.D.    Dg Abd Portable 1v  08/20/2012  *RADIOLOGY REPORT*  Clinical Data: Right flank pain and nausea.  PORTABLE ABDOMEN - 1 VIEW  Comparison: None.  Findings: The visualized bowel gas pattern is unremarkable. Scattered air and stool filled loops of colon are seen; no abnormal dilatation of small bowel loops is seen to suggest small bowel obstruction.  No free intra-abdominal air is identified, though evaluation for free air is limited on supine views.  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 Clips are noted on both sides of the abdomen.  IMPRESSION:  1.  Unremarkable bowel gas pattern; no free intra-abdominal air seen. 2.  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   Original Report Authenticated By: Santa Lighter, M.D.     Assessment/Plan Present on Admission:  66 yo male with cap . PNA (pneumonia) . HYPERTENSION . Nausea & vomiting . Grief at loss of child  pna pathway.  Iv azithro/rocephin.  Zofran.  Hold bp meds for now.  Should be able to switch to po abx in the next day or so.  Vss, not hypoxic.   Shelley Cocke A

## 2012-08-20 NOTE — ED Notes (Signed)
Kristopher [name], pt's daughter [phone]
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
## 2012-08-20 NOTE — Progress Notes (Signed)
Pt received to the floor from ED, via stretcher, pt alert X3, skin dry and intact. IV access intact, Pt c/o sever pain 10/10. Vital signs stable.

## 2012-08-20 NOTE — Progress Notes (Signed)
Utilization review completed. Ascencion Stegner, RN, BSN. 

## 2012-08-20 NOTE — ED Notes (Signed)
Pt resting.  States pain relieved.  IV antibiotics infusing.

## 2012-08-20 NOTE — Progress Notes (Signed)
[facility]                                                                                Patient Demographics  Kristopher [name], is a 66 y.o. male  [id number]  [id number]  DOB - 1946-01-17  Admit date - 08/19/2012  Admitting Physician Derrill Kay, MD  Outpatient Primary MD for the patient is No primary provider on file.  LOS - 1   Chief Complaint  Patient presents with  . Flank Pain  . Chest Pain        Assessment & Plan    1. PNA - minute required, follow cultures, agree with IV empiric antibiotics which will be continued, when necessary nebulizer and oxygen treatments   2. Right-sided upper back pain. 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   3. Nausea vomiting. Much improved KUB stable, trial of clear liquid diet.   4. Attention and dyslipidemia. Continue home medications.   5. Grief at loss of child- stable not suicidal homicidal, counseled.   Code Status: Full  Family Communication: Discussed with the patient that side  Disposition Plan: Home    Procedures CT abdomen pelvis, chest x-ray, T-spine x-ray, KUB   Consults  none   Time Spent in minutes  35   Antibiotics    Anti-infectives     Start     Dose/Rate Route Frequency Ordered Stop   08/21/12 0600   cefTRIAXone (ROCEPHIN) 1 g in dextrose 5 % 50 mL IVPB        1 g 100 mL/hr over 30 Minutes Intravenous Every 24 hours 08/20/12 0550 08/28/12 0559   08/21/12 0600   azithromycin (ZITHROMAX) 500 mg in dextrose 5 % 250 mL IVPB        500 mg 250 mL/hr over 60 Minutes Intravenous Every 24 hours 08/20/12 0550 08/28/12 0559   08/20/12 0115   cefTRIAXone (ROCEPHIN) 1 g in dextrose 5 % 50 mL IVPB        1 g 100 mL/hr over 30 Minutes Intravenous  Once 08/20/12 0106 08/20/12 0408   08/20/12 0115    azithromycin (ZITHROMAX) 500 mg in dextrose 5 % 250 mL IVPB        500 mg 250 mL/hr over 60 Minutes Intravenous  Once 08/20/12 0106 08/20/12 0333          Scheduled Meds:   . [COMPLETED] azithromycin (ZITHROMAX) 500 MG IVPB  500 mg Intravenous Once  . azithromycin  500 mg Intravenous Q24H  . carvedilol  25 mg Oral BID WC  . [COMPLETED] cefTRIAXone (ROCEPHIN)  IV  1 g Intravenous Once  . cefTRIAXone (ROCEPHIN)  IV  1 g Intravenous Q24H  . digoxin  0.125 mg Oral Daily  . diphenhydrAMINE  50 mg Oral BID  . [COMPLETED] fentaNYL  50 mcg Intravenous Once  . furosemide  40 mg Oral Daily  . [COMPLETED]  morphine injection  4 mg Intravenous Once  . [COMPLETED] ondansetron  4 mg Intravenous Once  . potassium chloride  20 mEq  Oral Daily  . simvastatin  20 mg Oral QPM  . sodium chloride  3 mL Intravenous Q12H  . [DISCONTINUED] sodium chloride   Intravenous STAT  . [DISCONTINUED] lisinopril  20 mg Oral Daily  . [DISCONTINUED] ondansetron  4 mg Intravenous Once  . [DISCONTINUED] potassium chloride  20 mEq Oral Daily   Continuous Infusions:  PRN Meds:.sodium chloride, ALPRAZolam, HYDROcodone-acetaminophen, morphine injection, ondansetron (ZOFRAN) IV, ondansetron (ZOFRAN) IV, sodium chloride, [DISCONTINUED] sodium chloride   DVT Prophylaxis  SCDs   Lab Results  Component Value Date   PLT 569* 08/20/2012      Lala Lund K M.D on 08/20/2012 at 10:15 AM  Between 7am to 7pm - Pager - [phone]  After 7pm go to [email or web address] - password [email or web address]  And look for the night coverage person covering for me after hours  [facility] Office  [phone]    Subjective:   Collin Clara today has, No headache, No chest pain, No abdominal pain - No Nausea, No new weakness tingling or numbness, No Cough - SOB. Having right-sided upper back pain which is constant and nonradiating.  Objective:   Filed Vitals:   08/20/12 0004 08/20/12 0138 08/20/12 0345 08/20/12 0441  BP:  138/41  139/55 136/45  Pulse:  54 66 68  Temp:  97.7 F (36.5 C) 97.5 F (36.4 C) 98.1 F (36.7 C)  TempSrc:  Oral Oral   Resp:  24 20 18   Height: 5\' 10"  (1.778 m)     Weight: 56.7 kg (125 lb)     SpO2:  98% 99% 93%    Wt Readings from Last 3 Encounters:  08/20/12 56.7 kg (125 lb)  11/28/11 63.957 kg (141 lb)  04/14/11 57.607 kg (127 lb)    No intake or output data in the 24 hours ending 08/20/12 1015  Exam Awake Alert, Oriented X 3, No new F.N deficits, Normal affect [place].AT,PERRAL Supple Neck,No JVD, No cervical lymphadenopathy appriciated.  Symmetrical Chest wall movement, Good air movement bilaterally, CTAB RRR,No Gallops,Rubs or new Murmurs, No Parasternal Heave +ve B.Sounds, Abd Soft, Non tender, No organomegaly appriciated, No rebound - guarding or rigidity. No Cyanosis, Clubbing or edema, No new Rash or bruise, some T-spine tenderness on the right side   Data Review   Micro Results No results found for this or any previous visit (from the past 240 hour(s)).  Radiology Reports Dg Chest Portable 1 View  08/20/2012  *RADIOLOGY REPORT*  Clinical Data: Shortness of breath.  Chest pain.  Nausea.  PORTABLE CHEST - 1 VIEW 08/20/2012 0013 hours:  Comparison: Two-view chest x-ray 07/11/2010, 04/05/2007.  Findings: Cardiac silhouette normal in size for the AP cortical technique and degree of inspiration.  Prominent bronchovascular markings diffusely and central peribronchial thickening, similar prior.  Patchy airspace opacities in the left mid lung and left base.  Lungs otherwise clear.  Possible small left pleural effusion.  IMPRESSION: Pneumonia involving the left mid lung and left base, superimposed upon baseline changes of COPD.   Original Report Authenticated By: Evangeline Dakin, M.D.    Dg Abd Portable 1v  08/20/2012  *RADIOLOGY REPORT*  Clinical Data: Right flank pain and nausea.  PORTABLE ABDOMEN - 1 VIEW  Comparison: None.  Findings: The visualized bowel gas pattern is  unremarkable. Scattered air and stool filled loops of colon are seen; no abnormal dilatation of small bowel loops is seen to suggest small bowel obstruction.  No free intra-abdominal air is identified, though evaluation for free air is limited on supine views.  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 Clips are noted on both sides of the abdomen.  IMPRESSION:  1.  Unremarkable bowel gas pattern; no free intra-abdominal air seen. 2.  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   Original Report Authenticated By: Santa Lighter, M.D.     CBC  Lab 08/20/12 0001  WBC 16.0*  HGB 14.0  HCT 39.3  PLT 569*  MCV 85.4  MCH 30.4  MCHC 35.6  RDW 14.5  LYMPHSABS --  MONOABS --  EOSABS --  BASOSABS --  BANDABS --    Chemistries   Lab 08/20/12 0001  NA 137  K 3.7  CL 97  CO2 33*  GLUCOSE 139*  BUN 10  CREATININE 0.97  CALCIUM 9.4  MG --  AST 22  ALT 16  ALKPHOS 94  BILITOT 0.3   ------------------------------------------------------------------------------------------------------------------ estimated creatinine clearance is 60.1 ml/min (by C-G formula based on Cr of 0.97). ------------------------------------------------------------------------------------------------------------------ No results found for this basename: HGBA1C:2 in the last 72 hours ------------------------------------------------------------------------------------------------------------------ No results found for this basename: CHOL:2,HDL:2,LDLCALC:2,TRIG:2,CHOLHDL:2,LDLDIRECT:2 in the last 72 hours ------------------------------------------------------------------------------------------------------------------ No results found for this basename: TSH,T4TOTAL,FREET3,T3FREE,THYROIDAB in the last 72 hours ------------------------------------------------------------------------------------------------------------------ No results found for this basename:  VITAMINB12:2,FOLATE:2,FERRITIN:2,TIBC:2,IRON:2,RETICCTPCT:2 in the last 72 hours  Coagulation profile  Lab 08/20/12 0025  INR 1.01  PROTIME --    No results found for this basename: DDIMER:2 in the last 72 hours  Cardiac Enzymes  Lab 08/20/12 0025  CKMB --  TROPONINI <0.30  MYOGLOBIN --   ------------------------------------------------------------------------------------------------------------------ No components found with this basename: POCBNP:3

## 2012-08-21 DIAGNOSIS — Z0000 Encounter for general adult medical examination without abnormal findings: Secondary | ICD-10-CM

## 2012-08-21 LAB — CBC
MCV: 88.6 fL (ref 78.0–100.0)
Platelets: 507 10*3/uL — ABNORMAL HIGH (ref 150–400)
RBC: 4.57 MIL/uL (ref 4.22–5.81)
RDW: 15 % (ref 11.5–15.5)
WBC: 15 10*3/uL — ABNORMAL HIGH (ref 4.0–10.5)

## 2012-08-21 LAB — BASIC METABOLIC PANEL
CO2: 30 mEq/L (ref 19–32)
CO2: 32 mEq/L (ref 19–32)
Calcium: 8.8 mg/dL (ref 8.4–10.5)
Chloride: 96 mEq/L (ref 96–112)
Chloride: 96 mEq/L (ref 96–112)
Creatinine, Ser: 0.92 mg/dL (ref 0.50–1.35)
Creatinine, Ser: 0.92 mg/dL (ref 0.50–1.35)
GFR calc Af Amer: >90 mL/min (ref >90)
GFR calc Af Amer: >90 mL/min (ref >90)
Potassium: 3.9 mEq/L (ref 3.5–5.1)
Sodium: 135 mEq/L (ref 135–145)

## 2012-08-21 LAB — STREP PNEUMONIAE URINARY ANTIGEN: Strep Pneumo Urinary Antigen: NEGATIVE

## 2012-08-21 NOTE — Progress Notes (Signed)
[facility]                                                                                Patient Demographics  Kristopher [name], is a 66 y.o. male  [id number]  [id number]  DOB - Feb 05, 1946  Admit date - 08/19/2012  Admitting Physician Derrill Kay, MD  Outpatient Primary MD for the patient is No primary provider on file.  LOS - 2   Chief Complaint  Patient presents with  . Flank Pain  . Chest Pain        Assessment & Plan    1. PNA - minute required, follow cultures, agree with IV empiric antibiotics which will be continued, when necessary nebulizer and oxygen treatments, greatly improved likely discharge in a.m.   2. Right-sided upper back pain. 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 Feels much better.   3. Nausea vomiting. Much improved KUB stable, tolerated clear liquids yesterday, advance diet.   4. History of hypertension and dyslipidemia. Continue home medications.   5. Grief at loss of child- stable not suicidal homicidal, counseled.   6. Tiny splenules on CT scan in a patient with history of splenectomy, right nephrectomy and cholecystectomy. Have requested to discuss CT findings with the surgeon after discharge.   Code Status: Full  Family Communication: Discussed with the patient that side  Disposition Plan: Home    Procedures CT abdomen pelvis, chest x-ray, T-spine x-ray, KUB   Consults  none   Time Spent in minutes  35   Antibiotics    Anti-infectives     Start     Dose/Rate Route Frequency Ordered Stop   08/21/12 0600   cefTRIAXone (ROCEPHIN) 1 g in dextrose 5 % 50 mL IVPB        1 g 100 mL/hr over 30 Minutes Intravenous Every 24 hours 08/20/12 0550 08/28/12 0559   08/21/12 0600   azithromycin (ZITHROMAX) 500 mg in dextrose 5 % 250 mL IVPB        500 mg 250 mL/hr over 60 Minutes Intravenous Every 24 hours 08/20/12 0550 08/28/12 0559   08/20/12 0115   cefTRIAXone (ROCEPHIN) 1 g in dextrose 5 % 50 mL IVPB        1 g 100 mL/hr over 30 Minutes Intravenous  Once 08/20/12 0106 08/20/12 0408   08/20/12 0115   azithromycin (ZITHROMAX) 500 mg in dextrose 5 % 250 mL IVPB        500 mg 250 mL/hr over 60 Minutes Intravenous  Once 08/20/12 0106 08/20/12 0333          Scheduled Meds:    . azithromycin  500 mg Intravenous Q24H  . carvedilol  25 mg Oral BID WC  . cefTRIAXone (ROCEPHIN)  IV  1 g Intravenous Q24H  . digoxin  0.125 mg Oral Daily  . diphenhydrAMINE  50 mg Oral BID  . fentaNYL  25 mcg Transdermal Q72H  . furosemide  20 mg Oral Daily  . potassium chloride  20 mEq Oral Daily  . simvastatin  20 mg Oral QPM  . sodium chloride  3 mL Intravenous Q12H   Continuous Infusions:  PRN Meds:.[EXPIRED] sodium chloride, ALPRAZolam, HYDROcodone-acetaminophen, [COMPLETED] iohexol, morphine injection, [EXPIRED] ondansetron (ZOFRAN) IV, ondansetron (ZOFRAN) IV, sodium chloride   DVT Prophylaxis  SCDs   Lab Results  Component Value Date   PLT 507* 08/21/2012      Lala Lund K M.D on 08/21/2012 at 11:09 AM  Between 7am to 7pm - Pager - [phone]  After 7pm go to [email or web address] - password [email or web address]  And look for the night coverage person covering for me after hours  [facility] Office  [phone]    Subjective:   Kristopher [name] today has, No headache, No chest pain, No abdominal pain - No Nausea, No new weakness tingling or numbness, No Cough - SOB. Improved  right-sided upper back pain .  Objective:   Filed Vitals:   08/20/12 0441 08/20/12 1400 08/20/12 2230 08/21/12 0626  BP: 136/45 131/72 117/60 132/55  Pulse: 68 68 60 68  Temp: 98.1 F (36.7 C) 97.7 F (36.5 C) 97.7 F (36.5 C) 97.5 F (36.4 C)  TempSrc:   Oral   Resp: 18 18 18 18   Height:      Weight:      SpO2: 93% 95% 93% 94%    Wt  Readings from Last 3 Encounters:  08/20/12 56.7 kg (125 lb)  11/28/11 63.957 kg (141 lb)  04/14/11 57.607 kg (127 lb)     Intake/Output Summary (Last 24 hours) at 08/21/12 1109 Last data filed at 08/21/12 0700  Gross per 24 hour  Intake   1360 ml  Output    325 ml  Net   1035 ml    Exam Awake Alert, Oriented X 3, No new F.N deficits, Normal affect .AT,PERRAL Supple Neck,No JVD, No cervical lymphadenopathy appriciated.  Symmetrical Chest wall movement, Good air movement bilaterally, CTAB RRR,No Gallops,Rubs or new Murmurs, No Parasternal Heave +ve B.Sounds, Abd Soft, Non tender, No organomegaly appriciated, No rebound - guarding or rigidity. No Cyanosis, Clubbing or edema, No new Rash or bruise, some T-spine tenderness on the right side   Data Review   Micro Results No results found for this or any previous visit (from the past 240 hour(s)).  Radiology Reports Dg Chest Portable 1 View  08/20/2012  *RADIOLOGY REPORT*  Clinical Data: Shortness of breath.  Chest pain.  Nausea.  PORTABLE CHEST - 1 VIEW 08/20/2012 0013 hours:  Comparison: Two-view chest x-ray 07/11/2010, 04/05/2007.  Findings: Cardiac silhouette normal in size for the AP cortical technique and degree of inspiration.  Prominent bronchovascular markings diffusely and central peribronchial thickening, similar prior.  Patchy airspace opacities in the left mid lung and left base.  Lungs otherwise clear.  Possible small left pleural effusion.  IMPRESSION: Pneumonia involving the left mid lung and left base, superimposed upon baseline changes of COPD.   Original Report Authenticated By: Evangeline Dakin, M.D.    Dg Abd Portable 1v  08/20/2012  *RADIOLOGY REPORT*  Clinical Data: Right flank pain and nausea.  PORTABLE ABDOMEN - 1 VIEW  Comparison: None.  Findings: The visualized bowel gas pattern is unremarkable. Scattered air and stool filled loops of colon are seen; no abnormal dilatation of small bowel loops is seen to  suggest small bowel obstruction.  No free intra-abdominal air is identified, though evaluation for free air is limited on supine views.  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 Clips are noted on both sides of the abdomen.  IMPRESSION:  1.  Unremarkable bowel gas pattern; no free intra-abdominal air seen. 2.  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   Original Report Authenticated By: Santa Lighter, M.D.     CBC  Lab 08/21/12 0831 08/20/12 0001  WBC 15.0* 16.0*  HGB 13.7 14.0  HCT 40.5 39.3  PLT 507* 569*  MCV 88.6 85.4  MCH 30.0 30.4  MCHC 33.8 35.6  RDW 15.0 14.5  LYMPHSABS -- --  MONOABS -- --  EOSABS -- --  BASOSABS -- --  BANDABS -- --    Chemistries   Lab 08/21/12 0831 08/21/12 0655 08/20/12 0001  NA 135 135 137  K 3.9 4.3 3.7  CL 96 96 97  CO2 32 30 33*  GLUCOSE 95 83 139*  BUN 7 7 10   CREATININE 0.92 0.92 0.97  CALCIUM 8.8 8.8 9.4  MG -- -- --  AST -- -- 22  ALT -- -- 16  ALKPHOS -- -- 94  BILITOT -- -- 0.3   ------------------------------------------------------------------------------------------------------------------ estimated creatinine clearance is 63.3 ml/min (by C-G formula based on Cr of 0.92). ------------------------------------------------------------------------------------------------------------------ No results found for this basename: HGBA1C:2 in the last 72 hours ------------------------------------------------------------------------------------------------------------------ No results found for this basename: CHOL:2,HDL:2,LDLCALC:2,TRIG:2,CHOLHDL:2,LDLDIRECT:2 in the last 72 hours ------------------------------------------------------------------------------------------------------------------ No results found for this basename: TSH,T4TOTAL,FREET3,T3FREE,THYROIDAB in the last 72  hours ------------------------------------------------------------------------------------------------------------------ No results found for this basename: VITAMINB12:2,FOLATE:2,FERRITIN:2,TIBC:2,IRON:2,RETICCTPCT:2 in the last 72 hours  Coagulation profile  Lab 08/20/12 0025  INR 1.01  PROTIME --    No results found for this basename: DDIMER:2 in the last 72 hours  Cardiac Enzymes  Lab 08/20/12 0025  CKMB --  TROPONINI <0.30  MYOGLOBIN --   ------------------------------------------------------------------------------------------------------------------ No components found with this basename: POCBNP:3

## 2012-08-22 LAB — BASIC METABOLIC PANEL
CO2: 30 mEq/L (ref 19–32)
Chloride: 99 mEq/L (ref 96–112)
Glucose, Bld: 104 mg/dL — ABNORMAL HIGH (ref 70–99)
Potassium: 4 mEq/L (ref 3.5–5.1)
Sodium: 136 mEq/L (ref 135–145)

## 2012-08-22 LAB — LEGIONELLA ANTIGEN, URINE

## 2012-08-22 MED ORDER — AZITHROMYCIN 250 MG PO TABS: ORAL_TABLET | ORAL | Status: DC

## 2012-08-22 MED ORDER — ALPRAZOLAM 0.25 MG PO TABS: 0.2500 mg | ORAL_TABLET | Freq: Two times a day (BID) | ORAL | Status: DC | PRN

## 2012-08-22 MED ORDER — POLYETHYLENE GLYCOL 3350 17 G PO PACK: 17.0000 g | PACK | Freq: Every day | ORAL | Status: DC | PRN

## 2012-08-22 MED ORDER — HYDROCODONE-ACETAMINOPHEN 5-325 MG PO TABS: 1.0000 | ORAL_TABLET | Freq: Four times a day (QID) | ORAL | Status: DC | PRN

## 2012-08-22 NOTE — Discharge Summary (Signed)
[facility]                                                                                   Kristopher [name], is a 66 y.o. male  DOB January 30, 1946  MRN [id number].  Admission date:  08/19/2012  Discharge Date:  08/22/2012  Primary MD  No primary provider on file.  Admitting Physician  Derrill Kay, MD  Admission Diagnosis  Anorexia [783.0] Back pain [724.5] Community acquired pneumonia [486] Nausea vomiting and diarrhea [787.91, 787.01] Grief at loss of child [309.0] CAP community acquired pneumonia  Discharge Diagnosis     Principal Problem:  *PNA (pneumonia) Active Problems:  HYPERTENSION  Nausea & vomiting  Grief at loss of child    Past Medical History  Diagnosis Date  . Dyslipidemia   . Cardiomyopathy   . HTN (hypertension)     History reviewed. No pertinent past surgical history.       Discharge Diagnoses:   Principal Problem:  *PNA (pneumonia) Active Problems:  HYPERTENSION  Nausea & vomiting  Grief at loss of child    Discharge Condition: stable   Diet recommendation: See Discharge Instructions below   Consults case management   History of present illness and  Hospital Course:  See H&P, Labs, Consult and Test reports for all details in brief, patient was admitted for commit acquired pneumonia causing some pleuritic right-sided upper back pain, treated with IV antibiotics, now almost at baseline and symptom-free, no fever no leukocytosis, no oxygen needed, pain much improved, initially presented with nausea vomiting which is also resolved.   Patient has previous history of splenectomy, right-sided nephrectomy and cholecystectomy, on his workup here CT scan showed tiny splenules of unclear significance for which I have requested him to follow up with his primary care physician and his surgeon in 1-2 weeks.    Has underlying history of hypertension dyslipidemia for which home medications will be continued.    Today patient is  in good spirits not suicidal homicidal wants to go home.    Today   Subjective:   Kristopher [name] today has no headache,no chest abdominal pain,no new weakness tingling or numbness, feels much better wants to go home today.    Objective:   Blood pressure 148/72, pulse 73, temperature 97.7 F (36.5 C), temperature source Oral, resp. rate 18, height 5\' 10"  (1.778 m), weight 56.7 kg (125 lb), SpO2 99.00%.   Intake/Output Summary (Last 24 hours) at 08/22/12 0933 Last data filed at 08/22/12 0700  Gross per 24 hour  Intake   1080 ml  Output   1350 ml  Net   -270 ml    Exam Awake Alert, Oriented *3, No new F.N deficits, Normal affect [place].AT,PERRAL Supple Neck,No JVD, No cervical lymphadenopathy appriciated.  Symmetrical Chest wall movement, Good air movement bilaterally, CTAB RRR,No Gallops,Rubs or new Murmurs, No Parasternal Heave +ve B.Sounds, Abd Soft, Non tender, No organomegaly appriciated, No rebound -guarding or rigidity. No Cyanosis, Clubbing or edema, No new Rash or bruise  Data Review   Major procedures and Radiology Reports - PLEASE review detailed and final reports for all details in brief -  Dg Thoracic Spine 2 View  08/20/2012  *RADIOLOGY REPORT*  Clinical Data: No injury.   Back pain  THORACIC SPINE - 2 VIEW  Comparison: None.  Findings: There are syndesmophytes extending throughout the visualized thoracic and lumbar spine suggesting ankylosing spondylitis.  There is no fracture or spondylolisthesis.  The bones are diffusely demineralized.  The lungs show coarse reticular opacities, greater in the left lower lung zone where a current chest radiograph suggests an acute infiltrate.  There are multiple vascular clips in the upper abdomen.  IMPRESSION: No fracture or acute finding.  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   Original Report Authenticated By: Lajean Manes, M.D.    Ct Abdomen Pelvis W Contrast  08/20/2012  *RADIOLOGY REPORT*   Clinical Data: Right flank pain  CT ABDOMEN AND PELVIS WITH CONTRAST  Technique:  Multidetector CT imaging of the abdomen and pelvis was performed following the standard protocol during bolus administration of intravenous contrast.  Contrast: 30mL OMNIPAQUE IOHEXOL 300 MG/ML  SOLN  Comparison: None.  Findings: Mild atelectasis at the left lung base.  No pericardial fluid.  No focal hepatic lesion.  The gallbladder is surgically absent. The pancreas is atrophic.  No ductal dilatation.  Common duct and pancreatic duct are normal.  Multiple small splenules in the left upper quadrant at the site of prior splenectomy.  Adrenal glands are normal.  The left kidney is surgically absent.  No evidence of nodularity or mass the surgical bed.  Right kidney contains several nonenhancing simple cysts and other hypodensities too small to characterize.  No worrisome lesion.  The stomach, small bowel, and cecum are normal.  Colon and rectosigmoid colon are normal.  The abdominal aorta normal caliber.  No retroperitoneal periportal lymphadenopathy.  No free fluid in the pelvis.  Prostate gland is enlarged.  The bladder is mildly distended.  No pelvic lymphadenopathy.  There are fractures of the inferior pubic rami.  No aggressive osseous lesions. There is continuous osteophytosis of the spine suggesting ankylosing spondylitis.  There is heterogeneous marrow density and disc density in the spine.  IMPRESSION:  1.  No clear explanation for right flank pain.  No ureterolithiasis or obstructive uropathy. 2.  Post nephrectomy without evidence of complication. 3.  Multiple small splenules in the upper quadrant site splenectomy. 4.  Post cholecystectomy. 5.  Ankylosing spondylitis of the spine.   Original Report Authenticated By: Suzy Bouchard, M.D.    Dg Chest Portable 1 View  08/20/2012  *RADIOLOGY REPORT*  Clinical Data: Shortness of breath.  Chest pain.  Nausea.  PORTABLE CHEST - 1 VIEW 08/20/2012 0013 hours:  Comparison: Two-view  chest x-ray 07/11/2010, 04/05/2007.  Findings: Cardiac silhouette normal in size for the AP cortical technique and degree of inspiration.  Prominent bronchovascular markings diffusely and central peribronchial thickening, similar prior.  Patchy airspace opacities in the left mid lung and left base.  Lungs otherwise clear.  Possible small left pleural effusion.  IMPRESSION: Pneumonia involving the left mid lung and left base, superimposed upon baseline changes of COPD.   Original Report Authenticated By: Evangeline Dakin, M.D.    Dg Abd Portable 1v  08/20/2012  *RADIOLOGY REPORT*  Clinical Data: Right flank pain and nausea.  PORTABLE ABDOMEN - 1 VIEW  Comparison: None.  Findings: The visualized bowel gas pattern is unremarkable. Scattered air and stool filled loops of colon are seen; no abnormal dilatation of small bowel loops is seen to suggest small bowel obstruction.  No free intra-abdominal air is identified, though  evaluation for free air is limited on supine views.  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 Clips are noted on both sides of the abdomen.  IMPRESSION:  1.  Unremarkable bowel gas pattern; no free intra-abdominal air seen. 2.  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   Original Report Authenticated By: Santa Lighter, M.D.     Micro Results      Recent Results (from the past 240 hour(s))  CULTURE, BLOOD (ROUTINE X 2)     Status: Normal (Preliminary result)   Collection Time   08/20/12 12:35 AM      Component Value Range Status Comment   Specimen Description BLOOD RIGHT ARM   Final    Special Requests BOTTLES DRAWN AEROBIC AND ANAEROBIC 5CC   Final    Culture  Setup Time 08/20/2012 04:29   Final    Culture     Final    Value:        BLOOD CULTURE RECEIVED NO GROWTH TO DATE CULTURE WILL BE HELD FOR 5 DAYS BEFORE ISSUING A FINAL NEGATIVE REPORT   Report Status PENDING   Incomplete   CULTURE, BLOOD (ROUTINE X 2)     Status: Normal  (Preliminary result)   Collection Time   08/20/12 12:45 AM      Component Value Range Status Comment   Specimen Description BLOOD LEFT ARM   Final    Special Requests BOTTLES DRAWN AEROBIC ONLY 10CC   Final    Culture  Setup Time 08/20/2012 04:29   Final    Culture     Final    Value:        BLOOD CULTURE RECEIVED NO GROWTH TO DATE CULTURE WILL BE HELD FOR 5 DAYS BEFORE ISSUING A FINAL NEGATIVE REPORT   Report Status PENDING   Incomplete      CBC w Diff: Lab Results  Component Value Date   WBC 15.0* 08/21/2012   HGB 13.7 08/21/2012   HCT 40.5 08/21/2012   PLT 507* 08/21/2012   LYMPHOPCT 18.1 12/02/2011   MONOPCT 9.8 12/02/2011   EOSPCT 4.0 12/02/2011   BASOPCT 0.4 12/02/2011    CMP: Lab Results  Component Value Date   NA 136 08/22/2012   K 4.0 08/22/2012   CL 99 08/22/2012   CO2 30 08/22/2012   BUN 10 08/22/2012   CREATININE 1.01 08/22/2012   PROT 6.9 08/20/2012   ALBUMIN 2.7* 08/20/2012   BILITOT 0.3 08/20/2012   ALKPHOS 94 08/20/2012   AST 22 08/20/2012   ALT 16 08/20/2012  .   Discharge Instructions     Follow with Primary MD as suggested by case manager in 7 days , please discuss discuss your CT findings with your surgeon after discharge.  Get CBC, CMP, checked 7 days by Primary MD and again as instructed by your Primary MD. Get a 2 view Chest X ray done next visit.  Get Medicines reviewed and adjusted.  Please request your Prim.MD to go over all Hospital Tests and Procedure/Radiological results at the follow up, please get all Hospital records sent to your Prim MD by signing hospital release before you go home.  Activity: As tolerated with Full fall precautions use walker/cane & assistance as needed   Diet:  Heart healthy  For Heart failure patients - Check your Weight same time everyday, if you gain over 2 pounds, or you develop in leg swelling, experience more shortness of breath or chest pain, call your Primary MD immediately. Follow  Cardiac Low Salt Diet and 1.8  lit/day fluid restriction.  Disposition Home   If you experience worsening of your admission symptoms, develop shortness of breath, life threatening emergency, suicidal or homicidal thoughts you must seek medical attention immediately by calling 911 or calling your MD immediately  if symptoms less severe.  You Must read complete instructions/literature along with all the possible adverse reactions/side effects for all the Medicines you take and that have been prescribed to you. Take any new Medicines after you have completely understood and accpet all the possible adverse reactions/side effects.   Do not drive and provide baby sitting services if your were admitted for syncope or siezures until you have seen by Primary MD or a Neurologist and advised to do so again.  Do not drive when taking Pain medications.    Do not take more than prescribed Pain, Sleep and Anxiety Medications  Special Instructions: If you have smoked or chewed Tobacco  in the last 2 yrs please stop smoking, stop any regular Alcohol  and or any Recreational drug use.  Wear Seat belts while driving.  Follow-up Information    Follow up with PCP as suggested by Case Manager and your Surgeon. Schedule an appointment as soon as possible for a visit in 1 week.           Discharge Medications     Medication List     As of 08/22/2012  9:33 AM    START taking these medications         azithromycin 250 MG tablet   Commonly known as: ZITHROMAX   Po daily x 5 days      HYDROcodone-acetaminophen 5-325 MG per tablet   Commonly known as: NORCO/VICODIN   Take 1 tablet by mouth every 6 (six) hours as needed.      polyethylene glycol packet   Commonly known as: MIRALAX / GLYCOLAX   Take 17 g by mouth daily as needed.      CHANGE how you take these medications         ALPRAZolam 0.25 MG tablet   Commonly known as: XANAX   Take 1 tablet (0.25 mg total) by mouth 2 (two) times daily as needed for anxiety.   What  changed: reasons to take the med      CONTINUE taking these medications         carvedilol 25 MG tablet   Commonly known as: COREG      DAYQUIL MULTI-SYMPTOM 60-650-20 MG/30ML Liqd   Generic drug: Pseudoephedrine-APAP-DM      digoxin 0.125 MG tablet   Commonly known as: LANOXIN   TAKE ONE TABLET BY MOUTH DAILY      diphenhydrAMINE 25 MG tablet   Commonly known as: BENADRYL      furosemide 40 MG tablet   Commonly known as: LASIX      lisinopril 20 MG tablet   Commonly known as: PRINIVIL,ZESTRIL   Take 1 tablet (20 mg total) by mouth daily.      NYQUIL D COLD/FLU 60-12.03-11-999 MG/30ML Liqd   Generic drug: Pseudoeph-Doxylamine-DM-APAP      potassium chloride 20 MEQ packet   Commonly known as: KLOR-CON      simvastatin 20 MG tablet   Commonly known as: ZOCOR          Where to get your medications    These are the prescriptions that you need to pick up.   You may get these medications from any pharmacy.  ALPRAZolam 0.25 MG tablet   azithromycin 250 MG tablet   HYDROcodone-acetaminophen 5-325 MG per tablet   polyethylene glycol packet               Total Time in preparing paper work, data evaluation and todays exam - 35 minutes  Thurnell Lose M.D on 08/22/2012 at 9:33 AM  [place]  [phone]

## 2012-08-23 NOTE — Progress Notes (Signed)
Chaplain responded to page from pt's nurse. She said pt had lost his son recently and could use chaplain support. Pt said his son died at [place] this past 01/17/23. Son had been an alcoholic and exhibited erratic and destructive behavior for several years. Pt very much loved his son and had cared for him at home for some time. Son's remains are being cremated today. Pt lost another child in car accident some years ago. Pt has had numerous losses and trials but feels God has been with him through them all. Pt said he is youngest of 81 children and is sad that his siblings have not contacted him since son's death. We had prayer together. After I prayed for pt, he also prayed for my ministry. Pt expressed appreciation for my visit.

## 2012-08-26 LAB — CULTURE, BLOOD (ROUTINE X 2): Culture: NO GROWTH

## 2012-09-03 ENCOUNTER — Ambulatory Visit: Payer: Medicare Other | Admitting: Internal Medicine

## 2012-10-11 ENCOUNTER — Ambulatory Visit (INDEPENDENT_AMBULATORY_CARE_PROVIDER_SITE_OTHER): Payer: Medicare Other | Admitting: Internal Medicine

## 2012-10-11 ENCOUNTER — Encounter: Payer: Self-pay | Admitting: Internal Medicine

## 2012-10-11 VITALS — BP 140/72 | HR 74 | Ht 71.0 in | Wt 134.0 lb

## 2012-10-11 DIAGNOSIS — I428 Other cardiomyopathies: Secondary | ICD-10-CM

## 2012-10-11 DIAGNOSIS — I10 Essential (primary) hypertension: Secondary | ICD-10-CM

## 2012-10-11 DIAGNOSIS — E785 Hyperlipidemia, unspecified: Secondary | ICD-10-CM

## 2012-10-11 NOTE — Progress Notes (Signed)
HPI Patient is a 66 yo with a history of NICM (mild LV dysfunction), HL, HTN.  I saw him in Feb  SInce then he was admitted in 09-29-23 with pneumonia. Since seen he reports that his son died in September 29, 2023 Denies SOB  No CP   Does note that his wt is down.   No Known Allergies  Current Outpatient Prescriptions  Medication Sig Dispense Refill  . ALPRAZolam (XANAX) 0.25 MG tablet Take 1 tablet (0.25 mg total) by mouth 2 (two) times daily as needed for anxiety.  20 tablet  0  . azithromycin (ZITHROMAX) 250 MG tablet Po daily x 5 days  5 each  0  . carvedilol (COREG) 25 MG tablet Take 25 mg by mouth 2 (two) times daily with a meal.      . digoxin (LANOXIN) 0.125 MG tablet TAKE ONE TABLET BY MOUTH DAILY  90 tablet  2  . diphenhydrAMINE (BENADRYL) 25 MG tablet Take 50 mg by mouth 2 (two) times daily.      . furosemide (LASIX) 40 MG tablet Take 40 mg by mouth daily.      Marland Kitchen HYDROcodone-acetaminophen (NORCO/VICODIN) 5-325 MG per tablet Take 1 tablet by mouth every 6 (six) hours as needed.  20 tablet  0  . lisinopril (PRINIVIL,ZESTRIL) 20 MG tablet Take 1 tablet (20 mg total) by mouth daily.  90 tablet  3  . polyethylene glycol (MIRALAX / GLYCOLAX) packet Take 17 g by mouth daily as needed.  14 each  0  . potassium chloride (KLOR-CON) 20 MEQ packet Take 20 mEq by mouth daily.        . Pseudoephedrine-APAP-DM (DAYQUIL MULTI-SYMPTOM) 60-650-20 MG/30ML LIQD Take 30 mLs by mouth daily as needed. For cough      . simvastatin (ZOCOR) 20 MG tablet Take 20 mg by mouth every evening.        Past Medical History  Diagnosis Date  . Dyslipidemia   . Cardiomyopathy   . HTN (hypertension)     No past surgical history on file.  No family history on file.  History   Social History  . Marital Status: Divorced    Spouse Name: N/A    Number of Children: N/A  . Years of Education: N/A   Occupational History  . Not on file.   Social History Main Topics  . Smoking status: Current Every Day Smoker -- 1.0  packs/day    Types: Cigarettes  . Smokeless tobacco: Not on file     Comment: positive for tobacco abuse   . Alcohol Use: Not on file  . Drug Use: Not on file  . Sexually Active: Not on file   Other Topics Concern  . Not on file   Social History Narrative  . No narrative on file    Review of Systems:  All systems reviewed.  They are negative to the above problem except as previously stated.  Vital Signs: BP 140/72  Pulse 74  Ht 5\' 11"  (1.803 m)  Wt 134 lb (60.782 kg)  BMI 18.69 kg/m2  SpO2 98%  Physical Exam  HEENT:  Normocephalic, atraumatic. EOMI, PERRLA.  Neck: JVP is normal.  No bruits.  Lungs: clear to auscultation. No rales no wheezes.  Heart: Regular rate and rhythm. Normal S1, S2. No S3.   No significant murmurs. PMI not displaced.  Abdomen:  Supple, nontender. Normal bowel sounds. No masses. No hepatomegaly.  Extremities:   Good distal pulses throughout. No lower extremity edema.  Musculoskeletal :moving all  extremities.  Neuro:   alert and oriented x3.  CN II-XII grossly intact.   Assessment and Plan:  1.  NICM  Volume status lookds good.  I would keep on same regimen.  2.  HTN  Good control  3.  HL  Keep on statin.  F/u lipids  4.  Wt loss.  He continues to lose wt.  Currently 7# down from last visit  I encouraged him to increase suppllement.s.  Will follow.

## 2012-10-15 ENCOUNTER — Other Ambulatory Visit: Payer: Self-pay | Admitting: Internal Medicine

## 2012-10-18 ENCOUNTER — Encounter (HOSPITAL_COMMUNITY): Payer: Self-pay | Admitting: Emergency Medicine

## 2012-10-18 ENCOUNTER — Emergency Department (HOSPITAL_COMMUNITY): Payer: Medicare Other

## 2012-10-18 ENCOUNTER — Emergency Department (HOSPITAL_COMMUNITY)
Admission: EM | Admit: 2012-10-18 | Discharge: 2012-10-18 | Disposition: A | Discharge status: Home / Self Care | Payer: Medicare Other | Attending: Emergency Medicine | Admitting: Emergency Medicine

## 2012-10-18 DIAGNOSIS — R42 Dizziness and giddiness: Secondary | ICD-10-CM | POA: Insufficient documentation

## 2012-10-18 DIAGNOSIS — M542 Cervicalgia: Secondary | ICD-10-CM | POA: Insufficient documentation

## 2012-10-18 DIAGNOSIS — Z79899 Other long term (current) drug therapy: Secondary | ICD-10-CM | POA: Insufficient documentation

## 2012-10-18 DIAGNOSIS — F419 Anxiety disorder, unspecified: Secondary | ICD-10-CM

## 2012-10-18 DIAGNOSIS — I10 Essential (primary) hypertension: Secondary | ICD-10-CM | POA: Insufficient documentation

## 2012-10-18 DIAGNOSIS — E785 Hyperlipidemia, unspecified: Secondary | ICD-10-CM | POA: Insufficient documentation

## 2012-10-18 DIAGNOSIS — R51 Headache: Secondary | ICD-10-CM | POA: Insufficient documentation

## 2012-10-18 DIAGNOSIS — R110 Nausea: Secondary | ICD-10-CM | POA: Insufficient documentation

## 2012-10-18 DIAGNOSIS — I428 Other cardiomyopathies: Secondary | ICD-10-CM | POA: Insufficient documentation

## 2012-10-18 DIAGNOSIS — F17200 Nicotine dependence, unspecified, uncomplicated: Secondary | ICD-10-CM | POA: Insufficient documentation

## 2012-10-18 DIAGNOSIS — F411 Generalized anxiety disorder: Secondary | ICD-10-CM | POA: Insufficient documentation

## 2012-10-18 LAB — CBC WITH DIFFERENTIAL/PLATELET
Eosinophils Absolute: 0.6 10*3/uL (ref 0.0–0.7)
HCT: 46.3 % (ref 39.0–52.0)
Hemoglobin: 16.1 g/dL (ref 13.0–17.0)
Lymphs Abs: 2.2 10*3/uL (ref 0.7–4.0)
MCH: 30.6 pg (ref 26.0–34.0)
MCV: 87.9 fL (ref 78.0–100.0)
Monocytes Relative: 12 % (ref 3–12)
Neutrophils Relative %: 59 % (ref 43–77)
RBC: 5.27 MIL/uL (ref 4.22–5.81)

## 2012-10-18 LAB — BASIC METABOLIC PANEL
BUN: 11 mg/dL (ref 6–23)
GFR calc non Af Amer: 63 mL/min — ABNORMAL LOW (ref >90)
Glucose, Bld: 95 mg/dL (ref 70–99)
Potassium: 3.8 mEq/L (ref 3.5–5.1)

## 2012-10-18 LAB — URINALYSIS, ROUTINE W REFLEX MICROSCOPIC
Bilirubin Urine: NEGATIVE
Hgb urine dipstick: NEGATIVE
Specific Gravity, Urine: 1.005 (ref 1.005–1.030)
pH: 7.5 (ref 5.0–8.0)

## 2012-10-18 LAB — D-DIMER, QUANTITATIVE: D-Dimer, Quant: 0.39 ug/mL-FEU (ref 0.00–0.48)

## 2012-10-18 MED ORDER — ALPRAZOLAM 0.5 MG PO TABS: 0.5000 mg | ORAL_TABLET | Freq: Two times a day (BID) | ORAL | Status: DC | PRN

## 2012-10-18 NOTE — ED Notes (Signed)
Pt up to walk did well

## 2012-10-18 NOTE — ED Notes (Signed)
Pt,ambulate well walkin the hallway.return back to the room

## 2012-10-18 NOTE — ED Provider Notes (Signed)
History     CSN: [id number]  Arrival date & time 10/18/12  0707   First MD Initiated Contact with Patient 10/18/12 [phone]      Chief Complaint  Patient presents with  . Shortness of Breath    (Consider location/radiation/quality/duration/timing/severity/associated sxs/prior treatment) HPI Comments: Kristopher [name] is a 67 y.o. male who presents for evaluation of dizziness with nausea, headache, and neck pain, for several hours. He presents by EMS. They gave him aspirin. Patient is a vague historian. He denies chest pain, shortness of breath, abdominal pain, or back pain. He, states that he has chronic neck pain. There are no known modifying factors. He was seen by his cardiologist, one week ago. At that time. His weight was down 7 pounds. His cardiologist encouraged him to take nutritional supplements.  The history is provided by the patient.    Past Medical History  Diagnosis Date  . Dyslipidemia   . Cardiomyopathy   . HTN (hypertension)     History reviewed. No pertinent past surgical history.  No family history on file.  History  Substance Use Topics  . Smoking status: Current Every Day Smoker -- 1.0 packs/day    Types: Cigarettes  . Smokeless tobacco: Not on file     Comment: positive for tobacco abuse   . Alcohol Use: Not on file      Review of Systems  All other systems reviewed and are negative.    Allergies  Review of patient's allergies indicates no known allergies.  Home Medications   Current Outpatient Rx  Name  Route  Sig  Dispense  Refill  . CARVEDILOL 25 MG PO TABS   Oral   Take 25 mg by mouth 2 (two) times daily with a meal.         . DIGOXIN 0.125 MG PO TABS      TAKE ONE TABLET BY MOUTH DAILY   90 tablet   2   . DIPHENHYDRAMINE HCL 25 MG PO TABS   Oral   Take 50 mg by mouth at bedtime.          . FUROSEMIDE 40 MG PO TABS   Oral   Take 40 mg by mouth daily.         Marland Kitchen HYDROCODONE-ACETAMINOPHEN 5-325 MG PO TABS   Oral   Take 1  tablet by mouth every 6 (six) hours as needed. For anxiety         . LISINOPRIL 20 MG PO TABS   Oral   Take 1 tablet (20 mg total) by mouth daily.   90 tablet   3   . POTASSIUM CHLORIDE CRYS ER 20 MEQ PO TBCR   Oral   Take 20 mEq by mouth daily.         Marland Kitchen SIMVASTATIN 20 MG PO TABS   Oral   Take 20 mg by mouth every evening.         Marland Kitchen ALPRAZOLAM 0.5 MG PO TABS   Oral   Take 1 tablet (0.5 mg total) by mouth 2 (two) times daily as needed for anxiety.   20 tablet   0     BP 118/79  Pulse 68  Temp 97.6 F (36.4 C) (Oral)  Resp 22  SpO2 94%  Physical Exam  Nursing note and vitals reviewed. Constitutional: He is oriented to person, place, and time. He appears well-developed.       Underweight. Appears older than stated age.  HENT:  Head: Normocephalic and atraumatic.  Right Ear: External ear normal.  Left Ear: External ear normal.  Eyes: Conjunctivae normal and EOM are normal. Pupils are equal, round, and reactive to light.  Neck: Normal range of motion and phonation normal. Neck supple.  Cardiovascular: Normal rate, regular rhythm, normal heart sounds and intact distal pulses.   Pulmonary/Chest: Effort normal and breath sounds normal. He exhibits no bony tenderness.  Abdominal: Soft. Normal appearance. There is no tenderness. There is no guarding.  Musculoskeletal: Normal range of motion.  Neurological: He is alert and oriented to person, place, and time. He has normal strength. No cranial nerve deficit or sensory deficit. He exhibits normal muscle tone. Coordination normal.       No nystagmus  Skin: Skin is warm, dry and intact.  Psychiatric: He has a normal mood and affect. His behavior is normal. Judgment and thought content normal.    ED Course  Procedures (including critical care time)       Date: 07/30/2012  Rate: 71  Rhythm: normal sinus rhythm and premature ventricular contractions (PVC)  QRS Axis: normal  PR and QT Intervals: normal  ST/T Wave  abnormalities: normal  PR and QRS Conduction Disutrbances:left bundle branch block  Narrative Interpretation:   Old EKG Reviewed: unchanged Case was discussed with his primary care doctor, who agreed with the increase in his alprazolam dosing  Labs Reviewed  CBC WITH DIFFERENTIAL - Abnormal; Notable for the following:    Monocytes Absolute 1.2 (*)     Eosinophils Relative 7 (*)     All other components within normal limits  BASIC METABOLIC PANEL - Abnormal; Notable for the following:    GFR calc non Af Amer 63 (*)     GFR calc Af Amer 73 (*)     All other components within normal limits  URINALYSIS, ROUTINE W REFLEX MICROSCOPIC - Abnormal; Notable for the following:    Leukocytes, UA TRACE (*)     All other components within normal limits  DIGOXIN LEVEL - Abnormal; Notable for the following:    Digoxin Level 0.5 (*)     All other components within normal limits  URINE MICROSCOPIC-ADD ON  CG4 I-STAT (LACTIC ACID)  POCT I-STAT TROPONIN I  D-DIMER, QUANTITATIVE  URINE CULTURE   Dg Chest Port 1 View  10/18/2012  *RADIOLOGY REPORT*  Clinical Data: Weakness.  History of pneumonia.  PORTABLE CHEST - 1 VIEW  Comparison: 08/20/2012  Findings: Artifact overlies the chest.  Heart size is normal. There is calcification of the thoracic aorta.  There is mild pulmonary scarring.  Previous pneumonia seen in the left lower lobe has apparently resolved.  No sign of residual or new infiltrate, effusion or collapse.  No significant bony finding.  IMPRESSION: Resolution of previously seen left lower lobe pneumonia.  No active disease evident presently.  Pulmonary scarring.   Original Report Authenticated By: Nelson Chimes, M.D.    Nursing notes, applicable records and vitals reviewed.  Radiologic Images/Reports reviewed.   1. Anxiety   2. Headache       MDM  Nonspecific somatic complaints with negative ED evaluation.  Patient has anxiety    Plan: Home Medications- usual plus Xanax, increased  to 0.5 mg twice a day; Home Treatments- rest; Recommended follow up- PCP prn    Richarda Blade, MD 10/18/12 1744

## 2012-10-18 NOTE — ED Notes (Addendum)
Spoke with Delia Chimes NP confirmed xanax 0.25 mg BID prn and EDP will write prescription for  xanax 0.50mg  bid PRN Phone number [phone]

## 2012-10-18 NOTE — ED Notes (Signed)
Sudden onset of dizziness and nausea and sob spo2 99 ra  No cp pt was shivering  No cardiac pmh pt staes has 2 IV was giv4n 4 baby asa no nitro

## 2012-10-19 LAB — URINE CULTURE
Colony Count: NO GROWTH
Culture: NO GROWTH

## 2013-01-06 ENCOUNTER — Other Ambulatory Visit: Payer: Self-pay | Admitting: Internal Medicine

## 2013-04-07 ENCOUNTER — Other Ambulatory Visit: Payer: Self-pay | Admitting: Internal Medicine

## 2013-04-19 ENCOUNTER — Telehealth: Payer: Self-pay | Admitting: *Deleted

## 2013-04-19 NOTE — Telephone Encounter (Signed)
Refill sent. Manufacturer ok to change per [employer].

## 2013-04-21 ENCOUNTER — Encounter (INDEPENDENT_AMBULATORY_CARE_PROVIDER_SITE_OTHER): Payer: Self-pay | Admitting: General Surgery

## 2013-04-21 ENCOUNTER — Ambulatory Visit (INDEPENDENT_AMBULATORY_CARE_PROVIDER_SITE_OTHER): Payer: Medicare Other | Admitting: General Surgery

## 2013-04-21 VITALS — BP 98/56 | HR 60 | Temp 97.5°F | Resp 16 | Ht 69.0 in | Wt 128.0 lb

## 2013-04-21 DIAGNOSIS — R222 Localized swelling, mass and lump, trunk: Secondary | ICD-10-CM
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
## 2013-04-22 LAB — BASIC METABOLIC PANEL
BUN: 11 mg/dL (ref 6–23)
CO2: 32 mEq/L (ref 19–32)
Calcium: 9.1 mg/dL (ref 8.4–10.5)
Chloride: 103 mEq/L (ref 96–112)
Creat: 1.14 mg/dL (ref 0.50–1.35)
Glucose, Bld: 75 mg/dL (ref 70–99)

## 2013-05-02 ENCOUNTER — Ambulatory Visit
Admission: RE | Admit: 2013-05-02 | Discharge: 2013-05-02 | Disposition: A | Discharge status: Home / Self Care | Payer: Medicare Other | Source: Ambulatory Visit | Attending: General Surgery | Admitting: General Surgery

## 2013-05-02 DIAGNOSIS — R222 Localized swelling, mass and lump, trunk: Secondary | ICD-10-CM

## 2013-05-02 MED ORDER — IOHEXOL 300 MG/ML  SOLN
75.0000 mL | Freq: Once | INTRAMUSCULAR | Status: AC | PRN
  Administered 2013-05-02: 75 mL via INTRAVENOUS

## 2013-05-06 ENCOUNTER — Encounter (HOSPITAL_COMMUNITY): Payer: Self-pay | Admitting: Pharmacy Technician

## 2013-05-06 ENCOUNTER — Other Ambulatory Visit: Payer: Self-pay | Admitting: Radiology

## 2013-05-06 ENCOUNTER — Other Ambulatory Visit (INDEPENDENT_AMBULATORY_CARE_PROVIDER_SITE_OTHER): Payer: Self-pay

## 2013-05-06 ENCOUNTER — Encounter (INDEPENDENT_AMBULATORY_CARE_PROVIDER_SITE_OTHER): Payer: Self-pay

## 2013-05-06 DIAGNOSIS — R222 Localized swelling, mass and lump, trunk: Secondary | ICD-10-CM

## 2013-05-10 ENCOUNTER — Encounter (HOSPITAL_COMMUNITY): Payer: Self-pay

## 2013-05-10 ENCOUNTER — Ambulatory Visit (HOSPITAL_COMMUNITY)
Admission: RE | Admit: 2013-05-10 | Discharge: 2013-05-10 | Disposition: A | Discharge status: Home / Self Care | Payer: Medicare Other | Source: Ambulatory Visit | Attending: General Surgery | Admitting: General Surgery

## 2013-05-10 DIAGNOSIS — E785 Hyperlipidemia, unspecified: Secondary | ICD-10-CM | POA: Insufficient documentation

## 2013-05-10 DIAGNOSIS — I428 Other cardiomyopathies: Secondary | ICD-10-CM | POA: Insufficient documentation

## 2013-05-10 DIAGNOSIS — I10 Essential (primary) hypertension: Secondary | ICD-10-CM | POA: Insufficient documentation

## 2013-05-10 DIAGNOSIS — R222 Localized swelling, mass and lump, trunk: Secondary | ICD-10-CM

## 2013-05-10 DIAGNOSIS — F17200 Nicotine dependence, unspecified, uncomplicated: Secondary | ICD-10-CM | POA: Insufficient documentation

## 2013-05-10 DIAGNOSIS — R229 Localized swelling, mass and lump, unspecified: Secondary | ICD-10-CM | POA: Insufficient documentation

## 2013-05-10 LAB — PROTIME-INR
INR: 1.05 (ref 0.00–1.49)
Prothrombin Time: 13.5 seconds (ref 11.6–15.2)

## 2013-05-10 LAB — CBC
MCV: 89.9 fL (ref 78.0–100.0)
Platelets: 341 10*3/uL (ref 150–400)
RDW: 14.8 % (ref 11.5–15.5)
WBC: 9.5 10*3/uL (ref 4.0–10.5)

## 2013-05-10 LAB — APTT: aPTT: 33 seconds (ref 24–37)

## 2013-05-10 MED ORDER — SODIUM CHLORIDE 0.9 % IV SOLN
INTRAVENOUS | Status: DC
  Administered 2013-05-10: 09:00:00 via INTRAVENOUS

## 2013-05-10 MED ORDER — MIDAZOLAM HCL 2 MG/2ML IJ SOLN
INTRAMUSCULAR | Status: AC
  Filled 2013-05-10: qty 6

## 2013-05-10 MED ORDER — FENTANYL CITRATE 0.05 MG/ML IJ SOLN
INTRAMUSCULAR | Status: AC
  Filled 2013-05-10: qty 4

## 2013-05-10 NOTE — Procedures (Signed)
Technically successful US guided biopsy of left posterior chest wall mass.  No immediate complications.

## 2013-05-10 NOTE — ED Notes (Signed)
Pt does not want any sedation.  Lidocaine only will be used

## 2013-05-10 NOTE — H&P (Signed)
Chief Complaint: "I am here for a biopsy of a mass on left side of my back." Referring Physician: Dr. Lilyan Punt HPI: Kristopher [name] is an 67 y.o. male who saw Dr. Lilyan Punt in office on 7/10 after noticing a mass on the left side of his back. CT was performed 05/02/13 demonstrating a heterogenous subcutaneous soft tissue mass along left posterolateral ribs, new from 08/2012. Patient denies any previous history of malignancy. He admits to current tobacco abuse 50 pack years. He states he has family history of cancer with his mother having breast cancer. He admits to 15 lb weight loss within the past year and decrease in appetite. He denies any blood in his stool or urine. He denies any chest pain or shortness of breath.   Past Medical History:  Past Medical History  Diagnosis Date  . Dyslipidemia   . Cardiomyopathy   . HTN (hypertension)     Past Surgical History: History reviewed. No pertinent past surgical history.  Family History: History reviewed. No pertinent family history.  Social History:  reports that he has been smoking Cigarettes.  He has been smoking about 1.00 pack per day. He does not have any smokeless tobacco history on file. His alcohol and drug histories are not on file.  Allergies: No Known Allergies    Medication List    ASK your doctor about these medications       ALPRAZolam 0.5 MG tablet  Commonly known as:  XANAX  Take 0.5 mg by mouth 2 (two) times daily as needed for anxiety.     carvedilol 25 MG tablet  Commonly known as:  COREG  Take 25 mg by mouth 2 (two) times daily with a meal.     digoxin 0.125 MG tablet  Commonly known as:  LANOXIN  Take 0.125 mg by mouth daily.     diphenhydrAMINE 25 MG tablet  Commonly known as:  BENADRYL  Take 50 mg by mouth at bedtime as needed for sleep.     FLUoxetine 20 MG capsule  Commonly known as:  PROZAC  Take 20 mg by mouth daily.     furosemide 40 MG tablet  Commonly known as:  LASIX  Take 40 mg by mouth daily.      HYDROcodone-acetaminophen 5-325 MG per tablet  Commonly known as:  NORCO/VICODIN  Take 1 tablet by mouth every 6 (six) hours as needed for pain.     lisinopril 20 MG tablet  Commonly known as:  PRINIVIL,ZESTRIL  Take 20 mg by mouth daily.     potassium chloride SA 20 MEQ tablet  Commonly known as:  K-DUR,KLOR-CON  Take 20 mEq by mouth daily.     simvastatin 20 MG tablet  Commonly known as:  ZOCOR  Take 20 mg by mouth every evening.        Please HPI for pertinent positives, otherwise complete 10 system ROS negative.  Physical Exam: Temp: 97.14F, HR: 60bpm, BP: 98/66mmHg, Resp: 16rpm  General Appearance:  Alert, cooperative, no distress, appears stated age  Head:  Normocephalic, without obvious abnormality, atraumatic  Lungs:   Clear to auscultation bilaterally, no w/r/r, respirations unlabored without use of accessory muscles.  Chest Wall:  Left posterior thoracic soft tissue mass. No tenderness.  Heart:  Regular rate and rhythm, S1, S2 normal, no murmur, rub or gallop.  Abdomen:   Soft, non-tender, non distended.  Extremities: Extremities normal, atraumatic, no cyanosis or edema  Pulses: 2+ and symmetric  Neurologic: Normal affect, no gross deficits.  Results for orders placed during the hospital encounter of 05/10/13 (from the past 48 hour(s))  APTT     Status: None   Collection Time    05/10/13  9:07 AM      Result Value Range   aPTT 33  24 - 37 seconds  CBC     Status: None   Collection Time    05/10/13  9:07 AM      Result Value Range   WBC 9.5  4.0 - 10.5 K/uL   RBC 5.35  4.22 - 5.81 MIL/uL   Hemoglobin 16.1  13.0 - 17.0 g/dL   HCT 48.1  39.0 - 52.0 %   MCV 89.9  78.0 - 100.0 fL   MCH 30.1  26.0 - 34.0 pg   MCHC 33.5  30.0 - 36.0 g/dL   RDW 14.8  11.5 - 15.5 %   Platelets 341  150 - 400 K/uL  PROTIME-INR     Status: None   Collection Time    05/10/13  9:07 AM      Result Value Range   Prothrombin Time 13.5  11.6 - 15.2 seconds   INR 1.05  0.00 - 1.49    No results found.  Assessment/Plan New left posterolateral along the ribs subcutaneous soft tissue mass seen on CT. Patient is scheduled today for US guided biopsy.  Labs reviewed.  Risks and Benefits discussed with the patient and his brother. All of the patient's questions were answered, patient is agreeable to proceed. Consent signed and in chart.   Tsosie Billing D PA-C 05/10/2013, 9:40 AM

## 2013-05-13 ENCOUNTER — Ambulatory Visit (INDEPENDENT_AMBULATORY_CARE_PROVIDER_SITE_OTHER): Payer: Medicare Other | Admitting: Internal Medicine

## 2013-05-13 ENCOUNTER — Encounter (INDEPENDENT_AMBULATORY_CARE_PROVIDER_SITE_OTHER): Payer: Self-pay | Admitting: General Surgery

## 2013-05-13 ENCOUNTER — Ambulatory Visit (INDEPENDENT_AMBULATORY_CARE_PROVIDER_SITE_OTHER): Payer: Medicare Other | Admitting: General Surgery

## 2013-05-13 ENCOUNTER — Encounter: Payer: Self-pay | Admitting: Internal Medicine

## 2013-05-13 ENCOUNTER — Encounter (INDEPENDENT_AMBULATORY_CARE_PROVIDER_SITE_OTHER): Payer: Self-pay

## 2013-05-13 VITALS — BP 130/78 | HR 68 | Temp 97.0°F | Resp 16 | Wt 128.8 lb

## 2013-05-13 VITALS — BP 142/76 | HR 60 | Ht 69.0 in | Wt 130.0 lb

## 2013-05-13 DIAGNOSIS — R222 Localized swelling, mass and lump, trunk: Secondary | ICD-10-CM

## 2013-05-13 DIAGNOSIS — I428 Other cardiomyopathies: Secondary | ICD-10-CM

## 2013-05-13 DIAGNOSIS — I429 Cardiomyopathy, unspecified: Secondary | ICD-10-CM

## 2013-05-13 NOTE — Progress Notes (Signed)
Subjective:     Patient ID: Kristopher [name], male   DOB: [birth year], 67 y.o.   MRN: [id number]  HPI This patient follows up biopsy of left posterior chest wall mass and CT scan of the chest. A CT scan demonstrated that this was a soft tissue mass and we set him up for biopsy of this but his pathology was consistent with benign soft tissue and no evidence of malignancy. He says it really doesn't cause him much discomfort.  Review of Systems     Objective:   Physical Exam The mass is apparently unchanged. This is about 4 cm mass the posterior lateral chest wall on the left. This does feel firm and fairly immobile.    Assessment:     Left chest wall mass There is no evidence that this is malignant on biopsy but does feel firm. I discussed with him the options for continued observation and to perform excision if this is enlarging versus elective excision. He would like to know what this is and so I have offered him excision of the mass.  We discussed the pros and cons of each option and the risks of the surgery including infection, bleeding, tumor spread, recurrence, inability to completely remove lesion, and nerve injury/pain.  He is scheduled for cardiology appointment today with his cardiologist Dr. Harrington Challenger and if she feels that he is okay to undergo surgery then we will plan for elective excision of this mass. Otherwise, we would follow this for size and for symptoms and any change and perform excision only if there is further concern.     Plan:     Will plan for elective excisional biopsy if okay with his cardiologist.

## 2013-05-13 NOTE — Progress Notes (Signed)
HPI Patient is a 67 yo with a history of NICM (cath in 2012 normal coronary arteries; LVEF 20%)  I saw him last winter.   He is being evaluated for removal of L chest mass under general anesthesia He denies CP  No SOB  No dizziness.   No Known Allergies  Current Outpatient Prescriptions  Medication Sig Dispense Refill  . ALPRAZolam (XANAX) 0.5 MG tablet Take 0.5 mg by mouth 2 (two) times daily as needed for anxiety.      . carvedilol (COREG) 25 MG tablet Take 25 mg by mouth 2 (two) times daily with a meal.      . digoxin (LANOXIN) 0.125 MG tablet Take 0.125 mg by mouth daily.      . diphenhydrAMINE (BENADRYL) 25 MG tablet Take 50 mg by mouth at bedtime as needed for sleep.       Marland Kitchen FLUoxetine (PROZAC) 20 MG capsule Take 20 mg by mouth daily.      . furosemide (LASIX) 40 MG tablet Take 40 mg by mouth daily.      Marland Kitchen HYDROcodone-acetaminophen (NORCO/VICODIN) 5-325 MG per tablet Take 1 tablet by mouth every 6 (six) hours as needed for pain.       Marland Kitchen lisinopril (PRINIVIL,ZESTRIL) 20 MG tablet Take 20 mg by mouth daily.      . potassium chloride SA (K-DUR,KLOR-CON) 20 MEQ tablet Take 20 mEq by mouth daily.      . simvastatin (ZOCOR) 20 MG tablet Take 20 mg by mouth every evening.       No current facility-administered medications for this visit.    Past Medical History  Diagnosis Date  . Dyslipidemia   . Cardiomyopathy   . HTN (hypertension)     No past surgical history on file.  No family history on file.  History   Social History  . Marital Status: Divorced    Spouse Name: N/A    Number of Children: N/A  . Years of Education: N/A   Occupational History  . Not on file.   Social History Main Topics  . Smoking status: Current Every Day Smoker -- 1.00 packs/day    Types: Cigarettes  . Smokeless tobacco: Not on file     Comment: positive for tobacco abuse   . Alcohol Use: Not on file  . Drug Use: Not on file  . Sexually Active: Not on file   Other Topics Concern  . Not on  file   Social History Narrative  . No narrative on file    Review of Systems:  All systems reviewed.  They are negative to the above problem except as previously stated.  Vital Signs: BP 142/76  Pulse 60  Ht 5\' 9"  (1.753 m)  Wt 130 lb (58.968 kg)  BMI 19.19 kg/m2  Physical Exam  HEENT:  Normocephalic, atraumatic. EOMI, PERRLA.  Neck: JVP is normal.  No bruits.  Lungs: clear to auscultation. No rales no wheezes.  Heart: Regular rate and rhythm. Normal S1, S2. No S3.   No significant murmurs. PMI not displaced.  Abdomen:  Supple, nontender. Normal bowel sounds. No masses. No hepatomegaly.  Extremities:   Good distal pulses throughout. No lower extremity edema.  Musculoskeletal :moving all extremities.  Neuro:   alert and oriented x3.  CN II-XII grossly intact.  EKG SR 60 First degree AVB  PR 208 msec. LBBB   Assessment and Plan:  1.  NICM  Volum status looks good  I would keep on same regimen Would repeat  echo  2.  Mass.  Patient is at low to moderate risk for volume overload which could be treated medicallly as needed.  OK to proceed with surgery

## 2013-05-13 NOTE — Patient Instructions (Addendum)
Your physician has requested that you have an echocardiogram. Echocardiography is a painless test that uses sound waves to create images of your heart. It provides your doctor with information about the size and shape of your heart and how well your heart's chambers and valves are working. This procedure takes approximately one hour. There are no restrictions for this procedure.  Your physician wants you to follow-up in: April 2015 with Dr. Harrington Challenger.  You will receive a reminder letter in the mail two months in advance. If you don't receive a letter, please call our office to schedule the follow-up appointment.  No medication changes were made today

## 2013-05-16 ENCOUNTER — Ambulatory Visit (HOSPITAL_COMMUNITY): Payer: Medicare Other | Attending: Cardiology | Admitting: Radiology

## 2013-05-16 DIAGNOSIS — I428 Other cardiomyopathies: Secondary | ICD-10-CM

## 2013-05-16 DIAGNOSIS — Z01810 Encounter for preprocedural cardiovascular examination: Secondary | ICD-10-CM

## 2013-05-16 DIAGNOSIS — F17200 Nicotine dependence, unspecified, uncomplicated: Secondary | ICD-10-CM | POA: Insufficient documentation

## 2013-05-16 DIAGNOSIS — I10 Essential (primary) hypertension: Secondary | ICD-10-CM | POA: Insufficient documentation

## 2013-05-16 DIAGNOSIS — I429 Cardiomyopathy, unspecified: Secondary | ICD-10-CM

## 2013-05-16 NOTE — Progress Notes (Signed)
Echocardiogram performed.  

## 2013-06-01 ENCOUNTER — Telehealth: Payer: Self-pay | Admitting: Internal Medicine

## 2013-06-01 DIAGNOSIS — T460X5A Adverse effect of cardiac-stimulant glycosides and drugs of similar action, initial encounter: Secondary | ICD-10-CM

## 2013-06-01 DIAGNOSIS — I10 Essential (primary) hypertension: Secondary | ICD-10-CM

## 2013-06-01 DIAGNOSIS — I255 Ischemic cardiomyopathy: Secondary | ICD-10-CM

## 2013-06-01 NOTE — Telephone Encounter (Signed)
Pt's dtr lynn calling re pt's BP, 87/51 yesterday, has been really cold, pls advise

## 2013-06-01 NOTE — Telephone Encounter (Signed)
He should come in Thursday for BP, P, labs (CBC, BMET, dig level)

## 2013-06-01 NOTE — Telephone Encounter (Signed)
Blood pressure machine at [facility] is where he took his blood pressure, 87/51 and heart rate 79. They have no blood pressure machine at home. Patient sleeps with blanket heating blanket in addition to other blankets, with 2 dogs in bed and it is 75 degrees in the house. She denies him having any dizziness or other symptoms. Advised to try to get an at home monitor and check daily blood pressures and let us know how it is, daughter agreed and verbalized understanding.  Did call daughter back to offer in office blood pressure check, had to leave that on her voice mail. Will forward to Dr Harrington Challenger for review

## 2013-06-01 NOTE — Telephone Encounter (Signed)
Left message for daughter to call back.  

## 2013-06-02 ENCOUNTER — Ambulatory Visit (INDEPENDENT_AMBULATORY_CARE_PROVIDER_SITE_OTHER): Payer: Medicare Other | Admitting: Internal Medicine

## 2013-06-02 ENCOUNTER — Other Ambulatory Visit (INDEPENDENT_AMBULATORY_CARE_PROVIDER_SITE_OTHER): Payer: Medicare Other

## 2013-06-02 VITALS — BP 120/60 | HR 70

## 2013-06-02 DIAGNOSIS — I255 Ischemic cardiomyopathy: Secondary | ICD-10-CM

## 2013-06-02 DIAGNOSIS — I2589 Other forms of chronic ischemic heart disease: Secondary | ICD-10-CM

## 2013-06-02 DIAGNOSIS — I5022 Chronic systolic (congestive) heart failure: Secondary | ICD-10-CM

## 2013-06-02 DIAGNOSIS — I509 Heart failure, unspecified: Secondary | ICD-10-CM

## 2013-06-02 DIAGNOSIS — I10 Essential (primary) hypertension: Secondary | ICD-10-CM

## 2013-06-02 DIAGNOSIS — T460X5A Adverse effect of cardiac-stimulant glycosides and drugs of similar action, initial encounter: Secondary | ICD-10-CM

## 2013-06-02 LAB — CBC WITH DIFFERENTIAL/PLATELET
Basophils Absolute: 0.1 10*3/uL (ref 0.0–0.1)
Basophils Relative: 0.6 % (ref 0.0–3.0)
Eosinophils Absolute: 0.6 10*3/uL (ref 0.0–0.7)
Lymphocytes Relative: 21.9 % (ref 12.0–46.0)
MCHC: 33.4 g/dL (ref 30.0–36.0)
Neutrophils Relative %: 61.8 % (ref 43.0–77.0)
RBC: 5.36 Mil/uL (ref 4.22–5.81)

## 2013-06-02 LAB — BASIC METABOLIC PANEL
Calcium: 8.6 mg/dL (ref 8.4–10.5)
GFR: 56.96 mL/min — ABNORMAL LOW (ref >60.00)
Glucose, Bld: 125 mg/dL — ABNORMAL HIGH (ref 70–99)
Sodium: 137 mEq/L (ref 135–145)

## 2013-06-02 NOTE — Progress Notes (Signed)
Pt here for bp check. He has no complaints today except he is unable to eat. He does not have an appetite. Medications reviewed and pt encouraged to cut back on orange drinks and to increase water and juice intake to help with his bp. Pt voiced understanding. He will try drinking ensure again, it made him constipated in the past. His only other complaint was being cold. Will forward for dr Harrington Challenger review.  PHarrington Challenger  No change in regimen.

## 2013-06-02 NOTE — Telephone Encounter (Signed)
Spoke with patient's daughter who states patient is feeling okay this morning but is willing to come in for nurse visit - BP/HR check and labs.  Patient plans to arrive in approximately one hour but wants to make certain we will not keep him long.  I advised patient's daughter that we would treat him as quickly and as thoroughly as possible.

## 2013-06-06 ENCOUNTER — Encounter (HOSPITAL_COMMUNITY): Payer: Self-pay | Admitting: Pharmacy Technician

## 2013-06-07 ENCOUNTER — Encounter (HOSPITAL_COMMUNITY): Payer: Self-pay

## 2013-06-07 ENCOUNTER — Ambulatory Visit (HOSPITAL_COMMUNITY)
Admission: RE | Admit: 2013-06-07 | Discharge: 2013-06-07 | Disposition: A | Discharge status: Home / Self Care | Payer: Medicare Other | Source: Ambulatory Visit | Attending: Anesthesiology | Admitting: Anesthesiology

## 2013-06-07 ENCOUNTER — Encounter (HOSPITAL_COMMUNITY)
Admission: RE | Admit: 2013-06-07 | Discharge: 2013-06-07 | Disposition: A | Discharge status: Home / Self Care | Payer: Medicare Other | Source: Ambulatory Visit | Attending: General Surgery | Admitting: General Surgery

## 2013-06-07 DIAGNOSIS — Z01818 Encounter for other preprocedural examination: Secondary | ICD-10-CM | POA: Insufficient documentation

## 2013-06-07 DIAGNOSIS — Z01812 Encounter for preprocedural laboratory examination: Secondary | ICD-10-CM | POA: Insufficient documentation

## 2013-06-07 HISTORY — DX: Unspecified hearing loss, unspecified ear: H91.90

## 2013-06-07 HISTORY — DX: Anxiety disorder, unspecified: F41.9

## 2013-06-07 HISTORY — DX: Depression, unspecified: F32.A

## 2013-06-07 HISTORY — DX: Major depressive disorder, single episode, unspecified: F32.9

## 2013-06-07 LAB — CBC
HCT: 46.1 % (ref 39.0–52.0)
Hemoglobin: 16.5 g/dL (ref 13.0–17.0)
WBC: 9.6 10*3/uL (ref 4.0–10.5)

## 2013-06-07 LAB — BASIC METABOLIC PANEL
BUN: 11 mg/dL (ref 6–23)
Chloride: 101 mEq/L (ref 96–112)
GFR calc Af Amer: 83 mL/min — ABNORMAL LOW (ref >90)
Potassium: 4.1 mEq/L (ref 3.5–5.1)

## 2013-06-07 NOTE — Progress Notes (Signed)
Cardiologist is Dr.Paula Harrington Challenger with last visit a week ago  Echo report in epic from 05-16-13  Heart cath in epic from 2002  Denies ever having a stress test  [facility] in [place]  EKG in epic from 05-13-13  Denies CXR in past yr

## 2013-06-07 NOTE — Progress Notes (Signed)
Left a message with Pamala Hurry that orders need to be put into epic

## 2013-06-08 NOTE — Progress Notes (Signed)
Anesthesia Chart Review:  Patient is a 67 year old male scheduled for excision of left chest wall mass on 06/14/13 by Dr. Lilyan Punt.  Anesthesia is posted as choice.  History includes smoking, dyslipidemia, depression, HTN, non-ischemic cardiomyopathy, left BBB, anxiety, impaired hearing, splenectomy, nephrectomy, hernia repair, cholecystectomy.    Cardiologist is Dr. Dorris Carnes with last visit on 05/16/13.  EKG then showed NSR, left BBB.  She was aware of plans for surgery and felt he was "at low to moderate risk for volume overload which could be treated medically as needed.  OK to proceed with surgery."  Of note, he was noted to be hypotensive recently with re-evaluation for BP and labs on 06/02/13--his last two reading in Epic have been 120's/60's.  Echo on 05/16/13 showed: Normal LV size with mild LV hypertrophy, EF 40%. Global hypokinesis. Normal RV size and systolic function. No significant valvular abnormalities.  Cardiac cath report on 10/16/00 showed severe cardiomyopathy, nonischemic with normal coronaries, EF 15-20% and normal right heart pressures having had recent diuresis.  CXR on 06/07/13 showed COPD without acute superimposed process.  Preoperative labs noted.    If no acute changes then I would anticipate that he could proceed as planned.  He will be evaluated by his assigned anesthesiologist on the day of surgery to discuss the definitive anesthesia plan.  [facility] Short Stay Center/Anesthesiology Phone [phone] 06/08/2013 2:42 PM

## 2013-06-09 NOTE — Progress Notes (Signed)
Called [facility], spoke with Cyril Mourning, Dr. Dyane Dustman nurse, regarding no orders for 06/14/2013 surgery, was told that she would "put a note in Epic" for MD.

## 2013-06-14 ENCOUNTER — Other Ambulatory Visit (INDEPENDENT_AMBULATORY_CARE_PROVIDER_SITE_OTHER): Payer: Self-pay | Admitting: General Surgery

## 2013-06-14 ENCOUNTER — Encounter (HOSPITAL_COMMUNITY)
Admission: RE | Disposition: A | Discharge status: Home / Self Care | Payer: Self-pay | Source: Ambulatory Visit | Attending: General Surgery

## 2013-06-14 ENCOUNTER — Encounter (HOSPITAL_COMMUNITY): Payer: Self-pay | Admitting: *Deleted

## 2013-06-14 ENCOUNTER — Ambulatory Visit (HOSPITAL_COMMUNITY)
Admission: RE | Admit: 2013-06-14 | Discharge: 2013-06-14 | Disposition: A | Discharge status: Home / Self Care | Payer: Medicare Other | Source: Ambulatory Visit | Attending: General Surgery | Admitting: General Surgery

## 2013-06-14 ENCOUNTER — Encounter (HOSPITAL_COMMUNITY): Payer: Self-pay | Admitting: Vascular Surgery

## 2013-06-14 ENCOUNTER — Ambulatory Visit (HOSPITAL_COMMUNITY): Payer: Medicare Other | Admitting: Anesthesiology

## 2013-06-14 DIAGNOSIS — R634 Abnormal weight loss: Secondary | ICD-10-CM | POA: Insufficient documentation

## 2013-06-14 DIAGNOSIS — Z87891 Personal history of nicotine dependence: Secondary | ICD-10-CM | POA: Insufficient documentation

## 2013-06-14 DIAGNOSIS — I10 Essential (primary) hypertension: Secondary | ICD-10-CM | POA: Insufficient documentation

## 2013-06-14 DIAGNOSIS — E785 Hyperlipidemia, unspecified: Secondary | ICD-10-CM | POA: Insufficient documentation

## 2013-06-14 DIAGNOSIS — M799 Soft tissue disorder, unspecified: Secondary | ICD-10-CM | POA: Insufficient documentation

## 2013-06-14 DIAGNOSIS — I428 Other cardiomyopathies: Secondary | ICD-10-CM | POA: Insufficient documentation

## 2013-06-14 DIAGNOSIS — M729 Fibroblastic disorder, unspecified: Secondary | ICD-10-CM

## 2013-06-14 DIAGNOSIS — R222 Localized swelling, mass and lump, trunk: Secondary | ICD-10-CM

## 2013-06-14 DIAGNOSIS — M6250 Muscle wasting and atrophy, not elsewhere classified, unspecified site: Secondary | ICD-10-CM

## 2013-06-14 DIAGNOSIS — Z79899 Other long term (current) drug therapy: Secondary | ICD-10-CM | POA: Insufficient documentation

## 2013-06-14 DIAGNOSIS — R6889 Other general symptoms and signs: Secondary | ICD-10-CM | POA: Insufficient documentation

## 2013-06-14 HISTORY — PX: MASS EXCISION: SHX2000

## 2013-06-14 SURGERY — EXCISION MASS
Anesthesia: General | Site: Chest | Laterality: Left | Wound class: Clean

## 2013-06-14 MED ORDER — OXYCODONE HCL 5 MG PO TABS: 5.0000 mg | ORAL_TABLET | Freq: Once | ORAL | Status: DC | PRN

## 2013-06-14 MED ORDER — SUCCINYLCHOLINE CHLORIDE 20 MG/ML IJ SOLN
INTRAMUSCULAR | Status: DC | PRN
  Administered 2013-06-14: 100 mg via INTRAVENOUS

## 2013-06-14 MED ORDER — LACTATED RINGERS IV SOLN
INTRAVENOUS | Status: DC
  Administered 2013-06-14 (×2): via INTRAVENOUS

## 2013-06-14 MED ORDER — HYDROMORPHONE HCL PF 1 MG/ML IJ SOLN: 0.2500 mg | INTRAMUSCULAR | Status: DC | PRN

## 2013-06-14 MED ORDER — PROPOFOL 10 MG/ML IV BOLUS
INTRAVENOUS | Status: DC | PRN
  Administered 2013-06-14: 120 mg via INTRAVENOUS
  Administered 2013-06-14: 30 mg via INTRAVENOUS

## 2013-06-14 MED ORDER — SUFENTANIL CITRATE 50 MCG/ML IV SOLN
INTRAVENOUS | Status: DC | PRN
  Administered 2013-06-14: 10 ug via INTRAVENOUS

## 2013-06-14 MED ORDER — LIDOCAINE-EPINEPHRINE (PF) 1 %-1:200000 IJ SOLN
INTRAMUSCULAR | Status: AC
  Filled 2013-06-14: qty 10

## 2013-06-14 MED ORDER — DEXTROSE 5 % IV SOLN
2.0000 g | INTRAVENOUS | Status: DC
  Filled 2013-06-14: qty 20

## 2013-06-14 MED ORDER — GLYCOPYRROLATE 0.2 MG/ML IJ SOLN
INTRAMUSCULAR | Status: DC | PRN
  Administered 2013-06-14: 0.2 mg via INTRAVENOUS

## 2013-06-14 MED ORDER — MIDAZOLAM HCL 5 MG/5ML IJ SOLN
INTRAMUSCULAR | Status: DC | PRN
  Administered 2013-06-14: 1 mg via INTRAVENOUS

## 2013-06-14 MED ORDER — BUPIVACAINE HCL 0.25 % IJ SOLN
INTRAMUSCULAR | Status: DC | PRN
  Administered 2013-06-14: 20 mL

## 2013-06-14 MED ORDER — OXYCODONE HCL 5 MG/5ML PO SOLN: 5.0000 mg | Freq: Once | ORAL | Status: DC | PRN

## 2013-06-14 MED ORDER — LIDOCAINE-EPINEPHRINE (PF) 1 %-1:200000 IJ SOLN
INTRAMUSCULAR | Status: DC | PRN
  Administered 2013-06-14: 20 mL via INTRADERMAL

## 2013-06-14 MED ORDER — PROMETHAZINE HCL 25 MG/ML IJ SOLN: 6.2500 mg | INTRAMUSCULAR | Status: DC | PRN

## 2013-06-14 MED ORDER — BUPIVACAINE HCL (PF) 0.25 % IJ SOLN
INTRAMUSCULAR | Status: AC
  Filled 2013-06-14: qty 30

## 2013-06-14 MED ORDER — 0.9 % SODIUM CHLORIDE (POUR BTL) OPTIME
TOPICAL | Status: DC | PRN
  Administered 2013-06-14: 1000 mL

## 2013-06-14 MED ORDER — PHENYLEPHRINE HCL 10 MG/ML IJ SOLN
INTRAMUSCULAR | Status: DC | PRN
  Administered 2013-06-14 (×2): 80 ug via INTRAVENOUS
  Administered 2013-06-14: 120 ug via INTRAVENOUS
  Administered 2013-06-14: 80 ug via INTRAVENOUS
  Administered 2013-06-14: 120 ug via INTRAVENOUS
  Administered 2013-06-14 (×3): 80 ug via INTRAVENOUS

## 2013-06-14 MED ORDER — HYDROCODONE-ACETAMINOPHEN 5-325 MG PO TABS: 1.0000 | ORAL_TABLET | ORAL | Status: DC | PRN

## 2013-06-14 MED ORDER — LIDOCAINE HCL (CARDIAC) 20 MG/ML IV SOLN
INTRAVENOUS | Status: DC | PRN
  Administered 2013-06-14: 5 mg via INTRAVENOUS

## 2013-06-14 MED ORDER — CEFAZOLIN SODIUM-DEXTROSE 2-3 GM-% IV SOLR: 2.0000 g | INTRAVENOUS | Status: DC

## 2013-06-14 SURGICAL SUPPLY — 48 items
ADH SKN CLS APL DERMABOND .7 (GAUZE/BANDAGES/DRESSINGS)
APL SKNCLS STERI-STRIP NONHPOA (GAUZE/BANDAGES/DRESSINGS)
BENZOIN TINCTURE PRP APPL 2/3 (GAUZE/BANDAGES/DRESSINGS) IMPLANT
BLADE SURG 10 STRL SS (BLADE) ×2 IMPLANT
BLADE SURG 15 STRL LF DISP TIS (BLADE) ×1 IMPLANT
BLADE SURG 15 STRL SS (BLADE) ×2
CANISTER SUCTION 2500CC (MISCELLANEOUS) IMPLANT
CLOTH BEACON ORANGE TIMEOUT ST (SAFETY) ×2 IMPLANT
CLSR STERI-STRIP ANTIMIC 1/2X4 (GAUZE/BANDAGES/DRESSINGS) ×1 IMPLANT
CONT SPECI 4OZ STER CLIK (MISCELLANEOUS) ×1 IMPLANT
COVER SURGICAL LIGHT HANDLE (MISCELLANEOUS) ×2 IMPLANT
DECANTER SPIKE VIAL GLASS SM (MISCELLANEOUS) IMPLANT
DERMABOND ADVANCED (GAUZE/BANDAGES/DRESSINGS)
DERMABOND ADVANCED .7 DNX12 (GAUZE/BANDAGES/DRESSINGS) IMPLANT
DRAPE LAPAROSCOPIC ABDOMINAL (DRAPES) IMPLANT
DRAPE PED LAPAROTOMY (DRAPES) IMPLANT
DRAPE UTILITY 15X26 W/TAPE STR (DRAPE) ×4 IMPLANT
DRSG TEGADERM 4X4.75 (GAUZE/BANDAGES/DRESSINGS) IMPLANT
ELECT CAUTERY BLADE 6.4 (BLADE) ×2 IMPLANT
ELECT REM PT RETURN 9FT ADLT (ELECTROSURGICAL) ×2
ELECTRODE REM PT RTRN 9FT ADLT (ELECTROSURGICAL) ×1 IMPLANT
GLOVE SURG SS PI 7.5 STRL IVOR (GLOVE) ×6 IMPLANT
GOWN STRL NON-REIN LRG LVL3 (GOWN DISPOSABLE) ×2 IMPLANT
GOWN STRL REIN XL XLG (GOWN DISPOSABLE) ×3 IMPLANT
KIT BASIN OR (CUSTOM PROCEDURE TRAY) ×2 IMPLANT
KIT ROOM TURNOVER OR (KITS) ×2 IMPLANT
NDL HYPO 25X1 1.5 SAFETY (NEEDLE) ×1 IMPLANT
NEEDLE HYPO 25X1 1.5 SAFETY (NEEDLE) ×2 IMPLANT
NS IRRIG 1000ML POUR BTL (IV SOLUTION) ×2 IMPLANT
PACK SURGICAL SETUP 50X90 (CUSTOM PROCEDURE TRAY) ×2 IMPLANT
PAD ARMBOARD 7.5X6 YLW CONV (MISCELLANEOUS) ×2 IMPLANT
PENCIL BUTTON HOLSTER BLD 10FT (ELECTRODE) ×2 IMPLANT
SPECIMEN JAR MEDIUM (MISCELLANEOUS) ×2 IMPLANT
SPONGE GAUZE 4X4 12PLY (GAUZE/BANDAGES/DRESSINGS) ×1 IMPLANT
SPONGE LAP 18X18 X RAY DECT (DISPOSABLE) ×2 IMPLANT
STRIP CLOSURE SKIN 1/2X4 (GAUZE/BANDAGES/DRESSINGS) IMPLANT
SUT MNCRL AB 4-0 PS2 18 (SUTURE) ×2 IMPLANT
SUT VIC AB 2-0 CTB1 (SUTURE) ×1 IMPLANT
SUT VIC AB 3-0 SH 27 (SUTURE) ×4
SUT VIC AB 3-0 SH 27X BRD (SUTURE) IMPLANT
SUT VIC AB 3-0 SH 27XBRD (SUTURE) ×1 IMPLANT
SYR BULB 3OZ (MISCELLANEOUS) ×2 IMPLANT
SYR CONTROL 10ML LL (SYRINGE) ×2 IMPLANT
TAPE CLOTH SURG 4X10 WHT LF (GAUZE/BANDAGES/DRESSINGS) ×1 IMPLANT
TOWEL OR 17X24 6PK STRL BLUE (TOWEL DISPOSABLE) ×2 IMPLANT
TOWEL OR 17X26 10 PK STRL BLUE (TOWEL DISPOSABLE) ×2 IMPLANT
TUBE CONNECTING 12X1/4 (SUCTIONS) IMPLANT
YANKAUER SUCT BULB TIP NO VENT (SUCTIONS) IMPLANT
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
## 2013-06-14 NOTE — Anesthesia Preprocedure Evaluation (Addendum)
Anesthesia Evaluation  Patient identified by MRN, date of birth, ID band Patient awake    Reviewed: Allergy & Precautions, H&P , NPO status   Airway Mallampati: II  Neck ROM: Limited    Dental  (+) Teeth Intact, Poor Dentition and Dental Advisory Given   Pulmonary  breath sounds clear to auscultation        Cardiovascular hypertension, +CHF Rhythm:Regular Rate:Normal  Hx of cardiomyopathy and recent cardiac eval on chart. NNotes reviewed.   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    GI/Hepatic negative GI ROS, Neg liver ROS,   Endo/Other    Renal/GU negative Renal ROS     Musculoskeletal   Abdominal   Peds  Hematology negative hematology ROS (+)   Anesthesia Other Findings   Reproductive/Obstetrics                          Anesthesia Physical Anesthesia Plan  ASA: III  Anesthesia Plan: General   Post-op Pain Management:    Induction: Intravenous  Airway Management Planned: Video Laryngoscope Planned  Additional Equipment:   Intra-op Plan:   Post-operative Plan: Extubation in OR  Informed Consent: I have reviewed the patients History and Physical, chart, labs and discussed the procedure including the risks, benefits and alternatives for the proposed anesthesia with the patient or authorized representative who has indicated his/her understanding and acceptance.   Dental advisory given  Plan Discussed with: CRNA and Surgeon  Anesthesia Plan Comments:        Anesthesia Quick Evaluation

## 2013-06-14 NOTE — Preoperative (Signed)
Beta Blockers   Reason not to administer Beta Blockers:Carvedilol taken at 2130 hrs on 06/13/13

## 2013-06-14 NOTE — Brief Op Note (Signed)
06/14/2013  12:57 PM  PATIENT:  Kristopher [name]  67 y.o. male  PRE-OPERATIVE DIAGNOSIS:  Left chest wall mass  POST-OPERATIVE DIAGNOSIS:  Left chest wall mass  PROCEDURE:  Procedure(s): EXCISION of left chest wall MASS (Left)  SURGEON:  Surgeon(s) and Role:    * Madilyn Hook, DO - Primary  PHYSICIAN ASSISTANT:   ASSISTANTS: none   ANESTHESIA:   general  EBL:  Total I/O In: 1000 [I.V.:1000] Out: -   BLOOD ADMINISTERED:none  DRAINS: none   LOCAL MEDICATIONS USED:  MARCAINE    and LIDOCAINE   SPECIMEN:  Source of Specimen:  left chest wall mass  DISPOSITION OF SPECIMEN:  PATHOLOGY  COUNTS:  YES  TOURNIQUET:  * No tourniquets in log *  DICTATION: .Other Dictation: Dictation Number dictated  PLAN OF CARE: Discharge to home after PACU  PATIENT DISPOSITION:  PACU - hemodynamically stable.   Delay start of Pharmacological VTE agent (>24hrs) due to surgical blood loss or risk of bleeding: no

## 2013-06-14 NOTE — Anesthesia Procedure Notes (Signed)
Procedure Name: Intubation Date/Time: 06/14/2013 11:46 AM Performed by: Melina Copa, Otniel Hoe R Pre-anesthesia Checklist: Patient identified, Emergency Drugs available, Suction available, Patient being monitored and Timeout performed Patient Re-evaluated:Patient Re-evaluated prior to inductionOxygen Delivery Method: Circle system utilized Preoxygenation: Pre-oxygenation with 100% oxygen Intubation Type: IV induction Ventilation: Mask ventilation without difficulty Tube size: 8.0 mm Number of attempts: 1 Airway Equipment and Method: Rigid stylet and Video-laryngoscopy Placement Confirmation: ETT inserted through vocal cords under direct vision,  positive ETCO2 and breath sounds checked- equal and bilateral Secured at: 22 cm Tube secured with: Tape Dental Injury: Teeth and Oropharynx as per pre-operative assessment

## 2013-06-14 NOTE — Transfer of Care (Signed)
Immediate Anesthesia Transfer of Care Note  Patient: Kristopher [name]  Procedure(s) Performed: Procedure(s): EXCISION of left chest wall MASS (Left)  Patient Location: PACU  Anesthesia Type:General  Level of Consciousness: sedated  Airway & Oxygen Therapy: Patient Spontanous Breathing and Patient connected to nasal cannula oxygen  Post-op Assessment: Report given to PACU RN, Post -op Vital signs reviewed and stable and Patient moving all extremities  Post vital signs: Reviewed and stable  Complications: No apparent anesthesia complications

## 2013-06-14 NOTE — Anesthesia Postprocedure Evaluation (Signed)
  Anesthesia Post-op Note  Patient: Kristopher [name]  Procedure(s) Performed: Procedure(s): EXCISION of left chest wall MASS (Left)  Patient Location: PACU  Anesthesia Type:General  Level of Consciousness: awake  Airway and Oxygen Therapy: Patient Spontanous Breathing  Post-op Pain: mild  Post-op Assessment: Post-op Vital signs reviewed  Post-op Vital Signs: stable  Complications: No apparent anesthesia complications

## 2013-06-15 NOTE — Op Note (Signed)
NAME:  Kristopher [name], Kristopher [name] NO.:  [id number]  MEDICAL RECORD NO.:  [id number]  LOCATION:  [facility]                         FACILITY:  [facility]  PHYSICIAN:  Madilyn Hook, MD       DATE OF BIRTH:  1946-06-18  DATE OF PROCEDURE:  06/14/2013 DATE OF DISCHARGE:  06/14/2013                              OPERATIVE REPORT   PROCEDURE:  Excision of left chest/back mass.  PREOPERATIVE DIAGNOSIS:  Excision of left chest/back mass.  SURGEON:  Madilyn Hook, MD  ASSISTANT:  None.  ANESTHESIA:  General LMA anesthesia with 40 mL of 1% lidocaine with epinephrine and 0.25% Marcaine in a 50:50 mixture.  FLUIDS:  1 L crystalloid.  ESTIMATED BLOOD LOSS:  Minimal.  DRAINS:  None.  SPECIMENS:  Left chest mass sent to Pathology for permanent section.  COMPLICATIONS:  None apparent.  FINDINGS:  Approximately 3 cm x 3 cm left chest frank mass rising from what appeared to be the area of the periosteum of the rib.  There was no evidence of intrathoracic involvement.  INDICATION FOR PROCEDURE:  Kristopher [name] is a 67 year old male, found this mass recently and he has been asymptomatic and since he just discovered that it has been increasing in size.  We obtained a CT scan of the chest, thinking that this was arising from the bone and it appeared to be a soft tissue mass on CT scan.  OPERATIVE DETAILS:  Kristopher [name] was seen and evaluated in the preoperative area and risks and benefits of procedure were again discussed in lay terms.  Informed consent was obtained.  He was given prophylactic antibiotics and taken to the operating room and general endotracheal anesthesia obtained.  He was placed in the lateral position.  His left chest was prepped and draped in the standard surgical fashion.  The procedure time-out was performed with all operative team members to confirm proper patient, procedure.  An elliptical incision was made in the skin overlying the mass.  I carried the dissection down into  the subcutaneous tissue using Bovie electrocautery.  Then, I attempted to comment on the mass laterally and normal-appearing tissue and approach the mass from the sides, however, this mass appeared to be deep to the fascia and deep to the musculature as well.  I continued to divide the muscles, so I came to what appeared to be a chest wall mass and did appear to be arising from directly over the rib from either the periosteum for possible neuroma from 1 of the intercostal nerves.  Apart from that fascia just overlying the ribs, I used Bovie electrocautery to dissect and some manual dissection.  I would isolate this mass and again it was fairly atypical and certainly did not appear to be like autonomous mass or sebaceous cyst.  The mass was completely undermined and was about 3 cm x 3 cm.  I thus elevated this mass bluntly seemed to separate from the underlying tissue which if this returns as a malignancy, he will likely need repeat excisions but I did not do any lateral dissection in case this returned benign.  This is malignancy would likely need some chest wall resection including some of  the ribs as the risks were immediately underlying this mass.  The lesion was sent to Pathology for permanent section.  Then, the wound was injected with 40 mL of 1% lidocaine with epinephrine and 0.25% Marcaine in a 50:50 mixture.  The wound was irrigated with sterile saline solution and hemostasis obtained with Bovie electrocautery.  The muscles were approximated with running locking 2-0 Vicryl suture and dermis were approximated with 3-0 Vicryl interrupted sutures.  The skin edges were approximated with 4-0 Monocryl subcuticular suture and the skin was washed and dried and benzoin and Steri-Strips were applied.  Sterile dressing was applied.  All sponge, needle, and instrument counts were correct in the case.  The patient tolerated the procedure well without apparent complications.           ______________________________ Madilyn Hook, MD     BL/MEDQ  D:  06/14/2013  T:  06/15/2013  Job:  [id number]

## 2013-06-16 ENCOUNTER — Encounter (HOSPITAL_COMMUNITY): Payer: Self-pay | Admitting: General Surgery

## 2013-06-30 ENCOUNTER — Encounter (INDEPENDENT_AMBULATORY_CARE_PROVIDER_SITE_OTHER): Payer: Medicare Other | Admitting: General Surgery

## 2013-06-30 ENCOUNTER — Telehealth (INDEPENDENT_AMBULATORY_CARE_PROVIDER_SITE_OTHER): Payer: Self-pay

## 2013-06-30 NOTE — Telephone Encounter (Signed)
Called patient to r/s appointment.  Reviewed pathology results with patient.  Patient states he's doing well and will call if needed to be seen.

## 2013-07-04 ENCOUNTER — Other Ambulatory Visit: Payer: Self-pay | Admitting: Internal Medicine

## 2013-10-14 ENCOUNTER — Other Ambulatory Visit: Payer: Self-pay

## 2013-10-14 ENCOUNTER — Other Ambulatory Visit: Payer: Self-pay | Admitting: *Deleted

## 2013-10-14 MED ORDER — DIGOXIN 125 MCG PO TABS: 0.1250 mg | ORAL_TABLET | Freq: Every day | ORAL | Status: DC

## 2013-10-14 MED ORDER — LISINOPRIL 20 MG PO TABS: ORAL_TABLET | ORAL | Status: DC

## 2013-10-14 MED ORDER — SIMVASTATIN 20 MG PO TABS: 20.0000 mg | ORAL_TABLET | Freq: Every day | ORAL | Status: DC

## 2013-10-14 MED ORDER — FUROSEMIDE 40 MG PO TABS: 40.0000 mg | ORAL_TABLET | Freq: Every day | ORAL | Status: DC

## 2013-10-14 MED ORDER — POTASSIUM CHLORIDE CRYS ER 20 MEQ PO TBCR: 20.0000 meq | EXTENDED_RELEASE_TABLET | Freq: Every day | ORAL | Status: DC

## 2013-10-14 MED ORDER — CARVEDILOL 25 MG PO TABS: 25.0000 mg | ORAL_TABLET | Freq: Two times a day (BID) | ORAL | Status: DC

## 2014-02-16 ENCOUNTER — Encounter: Payer: Self-pay | Admitting: Internal Medicine

## 2014-02-16 ENCOUNTER — Ambulatory Visit (INDEPENDENT_AMBULATORY_CARE_PROVIDER_SITE_OTHER): Payer: Medicare Other | Admitting: Internal Medicine

## 2014-02-16 VITALS — BP 110/50 | HR 62 | Ht 69.0 in | Wt 130.0 lb

## 2014-02-16 DIAGNOSIS — E785 Hyperlipidemia, unspecified: Secondary | ICD-10-CM

## 2014-02-16 DIAGNOSIS — R0602 Shortness of breath: Secondary | ICD-10-CM

## 2014-02-16 DIAGNOSIS — Z79899 Other long term (current) drug therapy: Secondary | ICD-10-CM

## 2014-02-16 DIAGNOSIS — I10 Essential (primary) hypertension: Secondary | ICD-10-CM

## 2014-02-16 LAB — HEPATIC FUNCTION PANEL
ALBUMIN: 3.9 g/dL (ref 3.5–5.2)
ALT: 14 U/L (ref 0–53)
AST: 21 U/L (ref 0–37)
Alkaline Phosphatase: 77 U/L (ref 39–117)
BILIRUBIN TOTAL: 0.7 mg/dL (ref 0.2–1.2)
Bilirubin, Direct: 0.1 mg/dL (ref 0.0–0.3)
Total Protein: 7.5 g/dL (ref 6.0–8.3)

## 2014-02-16 LAB — CBC
HCT: 48.6 % (ref 39.0–52.0)
HEMOGLOBIN: 16 g/dL (ref 13.0–17.0)
MCHC: 33 g/dL (ref 30.0–36.0)
MCV: 94.7 fl (ref 78.0–100.0)
PLATELETS: 324 10*3/uL (ref 150.0–400.0)
RBC: 5.13 Mil/uL (ref 4.22–5.81)
RDW: 14.6 % (ref 11.5–15.5)
WBC: 9.9 10*3/uL (ref 4.0–10.5)

## 2014-02-16 LAB — LIPID PANEL
CHOLESTEROL: 96 mg/dL (ref 0–200)
HDL: 38.3 mg/dL — ABNORMAL LOW (ref >39.00)
LDL CALC: 39 mg/dL (ref 0–99)
TRIGLYCERIDES: 93 mg/dL (ref 0.0–149.0)
Total CHOL/HDL Ratio: 3
VLDL: 18.6 mg/dL (ref 0.0–40.0)

## 2014-02-16 LAB — BASIC METABOLIC PANEL
BUN: 13 mg/dL (ref 6–23)
CHLORIDE: 99 meq/L (ref 96–112)
CO2: 32 mEq/L (ref 19–32)
CREATININE: 1.4 mg/dL (ref 0.4–1.5)
Calcium: 9.2 mg/dL (ref 8.4–10.5)
GFR: 52.7 mL/min — AB (ref >60.00)
GLUCOSE: 87 mg/dL (ref 70–99)
Potassium: 3.7 mEq/L (ref 3.5–5.1)
Sodium: 139 mEq/L (ref 135–145)

## 2014-02-16 LAB — BRAIN NATRIURETIC PEPTIDE: Pro B Natriuretic peptide (BNP): 100 pg/mL (ref 0.0–100.0)

## 2014-02-16 LAB — TSH: TSH: 0.32 u[IU]/mL — ABNORMAL LOW (ref 0.35–4.50)

## 2014-02-16 NOTE — Progress Notes (Signed)
HPI Patient is a 68 yo with a history of NICM (cath in 2012 normal coronary arteries; LVEF 20%)  I saw him in clinic last August SInce seen he had a mass taken off of his back  This was benign He had an echo done last summer  LVEF was 40% He remains active  [place] Still mourning death of sons.   He denies CP  No SOB  No dizziness.   No Known Allergies  Current Outpatient Prescriptions  Medication Sig Dispense Refill  . ALPRAZolam (XANAX) 0.5 MG tablet Take 0.5 mg by mouth 2 (two) times daily as needed for anxiety.      . carvedilol (COREG) 25 MG tablet Take 1 tablet (25 mg total) by mouth 2 (two) times daily with a meal.  180 tablet  1  . digoxin (LANOXIN) 0.125 MG tablet Take 1 tablet (0.125 mg total) by mouth daily.  90 tablet  1  . diphenhydrAMINE (BENADRYL) 25 MG tablet Take 50 mg by mouth at bedtime as needed for sleep.       Marland Kitchen FLUoxetine (PROZAC) 20 MG capsule Take 20 mg by mouth daily.      . furosemide (LASIX) 40 MG tablet Take 1 tablet (40 mg total) by mouth daily.  90 tablet  1  . HYDROcodone-acetaminophen (NORCO) 5-325 MG per tablet Take 1 tablet by mouth every 4 (four) hours as needed for pain.  40 tablet  0  . lisinopril (PRINIVIL,ZESTRIL) 20 MG tablet TAKE ONE TABLET BY MOUTH EVERY DAY  90 tablet  1  . potassium chloride SA (K-DUR,KLOR-CON) 20 MEQ tablet Take 1 tablet (20 mEq total) by mouth daily.  90 tablet  1  . simvastatin (ZOCOR) 20 MG tablet Take 1 tablet (20 mg total) by mouth at bedtime.  90 tablet  1   No current facility-administered medications for this visit.    Past Medical History  Diagnosis Date  . Dyslipidemia     takes Simvastatin daily  . Cardiomyopathy     takes Digoxin daily  . Anxiety     takes Xanax prn  . Depression     takes Prozac daily  . HTN (hypertension)     takes Carvedilol and Lisinopril daily  . Impaired hearing     left    Past Surgical History  Procedure Laterality Date  . Splenectomy      >47yrs ago  . Kidney removed     but unsure of which one;>33yrs ago  . Cholecystectomy      > 90yrs ago  . Hernia repair    . Colonoscopy    . Mass excision Left 06/14/2013    Procedure: EXCISION of left chest wall MASS;  Surgeon: Madilyn Hook, DO;  Location: [facility];  Service: General;  Laterality: Left;    No family history on file.  History   Social History  . Marital Status: Divorced    Spouse Name: N/A    Number of Children: N/A  . Years of Education: N/A   Occupational History  . Not on file.   Social History Main Topics  . Smoking status: Current Some Day Smoker -- 1.00 packs/day for 50 years    Types: Cigarettes  . Smokeless tobacco: Not on file     Comment: positive for tobacco abuse   . Alcohol Use: No  . Drug Use: No  . Sexual Activity: No   Other Topics Concern  . Not on file   Social History Narrative  . No  narrative on file    Review of Systems:  All systems reviewed.  They are negative to the above problem except as previously stated.  Vital Signs: BP 110/50  Pulse 62  Ht 5\' 9"  (1.753 m)  Wt 130 lb (58.968 kg)  BMI 19.19 kg/m2  Physical Exam  HEENT:  Normocephalic, atraumatic. EOMI, PERRLA.  Neck: JVP is normal.  No bruits.  Lungs: clear to auscultation. No rales no wheezes.  Heart: Regular rate and rhythm. Normal S1, S2. No S3.   No significant murmurs. PMI not displaced.  Abdomen:  Supple, nontender. Normal bowel sounds. No masses. No hepatomegaly.  Extremities:   Good distal pulses throughout. No lower extremity edema.  Musculoskeletal :moving all extremities.  Neuro:   alert and oriented x3.  CN II-XII grossly intact.  EKG SR 60 First degree AVB  PR 208 msec. LBBB   Assessment and Plan:  1.  NICM Echo last summer LVEF is improved from previous  On echo, LVEF was 40% TOday,  Volum status looks good  I would keep on same regimen Check labs  2.  Wt.  Encouraged him to try Enrich  Ensure makes him constipated

## 2014-02-16 NOTE — Patient Instructions (Signed)
Your physician recommends that you continue on your current medications as directed. Please refer to the Current Medication list given to you today.  Your physician recommends that you return for lab work TODAY (CBC, BMET, LIVER, LIPID, BNP, TSH)  Your physician recommends that you return for a follow up visit in about 9 months. (Jan/Feb 2016)

## 2014-03-03 ENCOUNTER — Other Ambulatory Visit: Payer: Self-pay | Admitting: *Deleted

## 2014-03-03 MED ORDER — SIMVASTATIN 20 MG PO TABS: 10.0000 mg | ORAL_TABLET | Freq: Every day | ORAL | Status: DC

## 2014-04-12 ENCOUNTER — Encounter (HOSPITAL_COMMUNITY): Payer: Self-pay | Admitting: Emergency Medicine

## 2014-04-12 ENCOUNTER — Emergency Department (HOSPITAL_COMMUNITY)
Admission: EM | Admit: 2014-04-12 | Discharge: 2014-04-12 | Disposition: A | Discharge status: Home / Self Care | Payer: Medicare Other | Attending: Emergency Medicine | Admitting: Emergency Medicine

## 2014-04-12 ENCOUNTER — Emergency Department (HOSPITAL_COMMUNITY): Payer: Medicare Other

## 2014-04-12 DIAGNOSIS — S99919A Unspecified injury of unspecified ankle, initial encounter: Secondary | ICD-10-CM | POA: Diagnosis not present

## 2014-04-12 DIAGNOSIS — S3981XA Other specified injuries of abdomen, initial encounter: Secondary | ICD-10-CM | POA: Insufficient documentation

## 2014-04-12 DIAGNOSIS — S20212A Contusion of left front wall of thorax, initial encounter: Secondary | ICD-10-CM

## 2014-04-12 DIAGNOSIS — I10 Essential (primary) hypertension: Secondary | ICD-10-CM | POA: Insufficient documentation

## 2014-04-12 DIAGNOSIS — Z792 Long term (current) use of antibiotics: Secondary | ICD-10-CM | POA: Diagnosis not present

## 2014-04-12 DIAGNOSIS — E785 Hyperlipidemia, unspecified: Secondary | ICD-10-CM | POA: Insufficient documentation

## 2014-04-12 DIAGNOSIS — Z8669 Personal history of other diseases of the nervous system and sense organs: Secondary | ICD-10-CM | POA: Diagnosis not present

## 2014-04-12 DIAGNOSIS — R404 Transient alteration of awareness: Secondary | ICD-10-CM | POA: Insufficient documentation

## 2014-04-12 DIAGNOSIS — F411 Generalized anxiety disorder: Secondary | ICD-10-CM | POA: Diagnosis not present

## 2014-04-12 DIAGNOSIS — S20219A Contusion of unspecified front wall of thorax, initial encounter: Secondary | ICD-10-CM | POA: Insufficient documentation

## 2014-04-12 DIAGNOSIS — F329 Major depressive disorder, single episode, unspecified: Secondary | ICD-10-CM | POA: Insufficient documentation

## 2014-04-12 DIAGNOSIS — Y9389 Activity, other specified: Secondary | ICD-10-CM | POA: Insufficient documentation

## 2014-04-12 DIAGNOSIS — Z862 Personal history of diseases of the blood and blood-forming organs and certain disorders involving the immune mechanism: Secondary | ICD-10-CM | POA: Insufficient documentation

## 2014-04-12 DIAGNOSIS — Y92410 Unspecified street and highway as the place of occurrence of the external cause: Secondary | ICD-10-CM | POA: Insufficient documentation

## 2014-04-12 DIAGNOSIS — J439 Emphysema, unspecified: Secondary | ICD-10-CM

## 2014-04-12 DIAGNOSIS — F17200 Nicotine dependence, unspecified, uncomplicated: Secondary | ICD-10-CM | POA: Insufficient documentation

## 2014-04-12 DIAGNOSIS — S8990XA Unspecified injury of unspecified lower leg, initial encounter: Secondary | ICD-10-CM | POA: Diagnosis not present

## 2014-04-12 DIAGNOSIS — F3289 Other specified depressive episodes: Secondary | ICD-10-CM | POA: Diagnosis not present

## 2014-04-12 DIAGNOSIS — S199XXA Unspecified injury of neck, initial encounter: Secondary | ICD-10-CM

## 2014-04-12 DIAGNOSIS — Z79899 Other long term (current) drug therapy: Secondary | ICD-10-CM | POA: Diagnosis not present

## 2014-04-12 DIAGNOSIS — J438 Other emphysema: Secondary | ICD-10-CM | POA: Insufficient documentation

## 2014-04-12 DIAGNOSIS — Z8639 Personal history of other endocrine, nutritional and metabolic disease: Secondary | ICD-10-CM | POA: Diagnosis not present

## 2014-04-12 DIAGNOSIS — IMO0002 Reserved for concepts with insufficient information to code with codable children: Secondary | ICD-10-CM | POA: Diagnosis not present

## 2014-04-12 DIAGNOSIS — S99929A Unspecified injury of unspecified foot, initial encounter: Secondary | ICD-10-CM

## 2014-04-12 DIAGNOSIS — S0993XA Unspecified injury of face, initial encounter: Secondary | ICD-10-CM | POA: Insufficient documentation

## 2014-04-12 DIAGNOSIS — S298XXA Other specified injuries of thorax, initial encounter: Secondary | ICD-10-CM | POA: Diagnosis present

## 2014-04-12 LAB — CBC WITH DIFFERENTIAL/PLATELET
BASOS ABS: 0 10*3/uL (ref 0.0–0.1)
Basophils Relative: 0 % (ref 0–1)
EOS PCT: 4 % (ref 0–5)
Eosinophils Absolute: 0.4 10*3/uL (ref 0.0–0.7)
HEMATOCRIT: 48 % (ref 39.0–52.0)
Hemoglobin: 16.3 g/dL (ref 13.0–17.0)
LYMPHS ABS: 2 10*3/uL (ref 0.7–4.0)
Lymphocytes Relative: 21 % (ref 12–46)
MCH: 30.9 pg (ref 26.0–34.0)
MCHC: 34 g/dL (ref 30.0–36.0)
MCV: 90.9 fL (ref 78.0–100.0)
MONO ABS: 0.9 10*3/uL (ref 0.1–1.0)
Monocytes Relative: 10 % (ref 3–12)
Neutro Abs: 6.2 10*3/uL (ref 1.7–7.7)
Neutrophils Relative %: 65 % (ref 43–77)
Platelets: 264 10*3/uL (ref 150–400)
RBC: 5.28 MIL/uL (ref 4.22–5.81)
RDW: 14.1 % (ref 11.5–15.5)
WBC: 9.5 10*3/uL (ref 4.0–10.5)

## 2014-04-12 LAB — I-STAT ARTERIAL BLOOD GAS, ED
Acid-Base Excess: 5 mmol/L — ABNORMAL HIGH (ref 0.0–2.0)
Bicarbonate: 31.5 mEq/L — ABNORMAL HIGH (ref 20.0–24.0)
O2 Saturation: 82 %
TCO2: 33 mmol/L (ref 0–100)
pCO2 arterial: 53.1 mmHg — ABNORMAL HIGH (ref 35.0–45.0)
pH, Arterial: 7.382 (ref 7.350–7.450)
pO2, Arterial: 48 mmHg — ABNORMAL LOW (ref 80.0–100.0)

## 2014-04-12 LAB — COMPREHENSIVE METABOLIC PANEL
ALT: 13 U/L (ref 0–53)
ANION GAP: 14 (ref 5–15)
AST: 21 U/L (ref 0–37)
Albumin: 3.3 g/dL — ABNORMAL LOW (ref 3.5–5.2)
Alkaline Phosphatase: 82 U/L (ref 39–117)
BUN: 10 mg/dL (ref 6–23)
CALCIUM: 8.3 mg/dL — AB (ref 8.4–10.5)
CO2: 24 meq/L (ref 19–32)
CREATININE: 0.99 mg/dL (ref 0.50–1.35)
Chloride: 101 mEq/L (ref 96–112)
GFR, EST NON AFRICAN AMERICAN: 82 mL/min — AB (ref >90)
GLUCOSE: 79 mg/dL (ref 70–99)
Potassium: 4.1 mEq/L (ref 3.7–5.3)
Sodium: 139 mEq/L (ref 137–147)
TOTAL PROTEIN: 6.8 g/dL (ref 6.0–8.3)
Total Bilirubin: 0.4 mg/dL (ref 0.3–1.2)

## 2014-04-12 LAB — I-STAT CHEM 8, ED
BUN: 10 mg/dL (ref 6–23)
CREATININE: 1 mg/dL (ref 0.50–1.35)
Calcium, Ion: 1 mmol/L — ABNORMAL LOW (ref 1.13–1.30)
Chloride: 102 mEq/L (ref 96–112)
Glucose, Bld: 79 mg/dL (ref 70–99)
HEMATOCRIT: 52 % (ref 39.0–52.0)
HEMOGLOBIN: 17.7 g/dL — AB (ref 13.0–17.0)
POTASSIUM: 4.1 meq/L (ref 3.7–5.3)
Sodium: 140 mEq/L (ref 137–147)
TCO2: 27 mmol/L (ref 0–100)

## 2014-04-12 LAB — I-STAT TROPONIN, ED: TROPONIN I, POC: 0 ng/mL (ref 0.00–0.08)

## 2014-04-12 LAB — I-STAT CG4 LACTIC ACID, ED: LACTIC ACID, VENOUS: 2.05 mmol/L (ref 0.5–2.2)

## 2014-04-12 LAB — TROPONIN I

## 2014-04-12 MED ORDER — IPRATROPIUM-ALBUTEROL 0.5-2.5 (3) MG/3ML IN SOLN
3.0000 mL | Freq: Once | RESPIRATORY_TRACT | Status: AC
  Administered 2014-04-12: 3 mL via RESPIRATORY_TRACT
  Filled 2014-04-12: qty 3

## 2014-04-12 MED ORDER — HYDROMORPHONE HCL PF 1 MG/ML IJ SOLN
0.5000 mg | Freq: Once | INTRAMUSCULAR | Status: AC
  Administered 2014-04-12: 0.5 mg via INTRAVENOUS

## 2014-04-12 MED ORDER — IOHEXOL 300 MG/ML  SOLN
100.0000 mL | Freq: Once | INTRAMUSCULAR | Status: AC | PRN
  Administered 2014-04-12: 100 mL via INTRAVENOUS

## 2014-04-12 MED ORDER — HYDROMORPHONE HCL PF 1 MG/ML IJ SOLN
INTRAMUSCULAR | Status: AC
  Administered 2014-04-12: 0.5 mg via INTRAVENOUS
  Filled 2014-04-12: qty 1

## 2014-04-12 MED ORDER — PREDNISONE 50 MG PO TABS: ORAL_TABLET | ORAL | Status: DC

## 2014-04-12 MED ORDER — HYDROCODONE-ACETAMINOPHEN 5-325 MG PO TABS: 2.0000 | ORAL_TABLET | ORAL | Status: DC | PRN

## 2014-04-12 MED ORDER — DOXYCYCLINE HYCLATE 100 MG PO CAPS: 100.0000 mg | ORAL_CAPSULE | Freq: Two times a day (BID) | ORAL | Status: DC

## 2014-04-12 MED ORDER — ALBUTEROL SULFATE HFA 108 (90 BASE) MCG/ACT IN AERS: 1.0000 | INHALATION_SPRAY | Freq: Four times a day (QID) | RESPIRATORY_TRACT | Status: DC | PRN

## 2014-04-12 NOTE — ED Notes (Signed)
Patient in radiology

## 2014-04-12 NOTE — ED Notes (Signed)
I gave the patient a pair of medium blue scrubs and a pair of extra large blue socks.

## 2014-04-12 NOTE — ED Notes (Signed)
Pt was given 150 mcg of fentanyl and zofran 4 mg by ems.
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
## 2014-04-12 NOTE — ED Notes (Signed)
Lactic acid results given to Dr. Wyvonnia Dusky

## 2014-04-12 NOTE — Discharge Instructions (Signed)
Chest Contusion There is no evidence of broken bone or rib fracture. Stop smoking. You have a mass on your lung Which is smaller than 1 year ago and need to follow up with pulmonology. Use the incentive spirometer as prescribed. Return to the ED if you develop chest pain, shortness of breath or worsening symptoms. A chest contusion is a deep bruise on your chest area. Contusions are the result of an injury that caused bleeding under the skin. A chest contusion may involve bruising of the skin, muscles, or ribs. The contusion may turn blue, purple, or yellow. Minor injuries will give you a painless contusion, but more severe contusions may stay painful and swollen for a few weeks. CAUSES  A contusion is usually caused by a blow, trauma, or direct force to an area of the body. SYMPTOMS   Swelling and redness of the injured area.  Discoloration of the injured area.  Tenderness and soreness of the injured area.  Pain. DIAGNOSIS  The diagnosis can be made by taking a history and performing a physical exam. An X-ray, CT scan, or MRI may be needed to determine if there were any associated injuries, such as broken bones (fractures) or internal injuries. TREATMENT  Often, the best treatment for a chest contusion is resting, icing, and applying cold compresses to the injured area. Deep breathing exercises may be recommended to reduce the risk of pneumonia. Over-the-counter medicines may also be recommended for pain control. HOME CARE INSTRUCTIONS   Put ice on the injured area.  Put ice in a plastic bag.  Place a towel between your skin and the bag.  Leave the ice on for 15-20 minutes, 03-04 times a day.  Only take over-the-counter or prescription medicines as directed by your caregiver. Your caregiver may recommend avoiding anti-inflammatory medicines (aspirin, ibuprofen, and naproxen) for 48 hours because these medicines may increase bruising.  Rest the injured area.  Perform deep-breathing  exercises as directed by your caregiver.  Stop smoking if you smoke.  Do not lift objects over 5 pounds (2.3 kg) for 3 days or longer if recommended by your caregiver. SEEK IMMEDIATE MEDICAL CARE IF:   You have increased bruising or swelling.  You have pain that is getting worse.  You have difficulty breathing.  You have dizziness, weakness, or fainting.  You have blood in your urine or stool.  You cough up or vomit blood.  Your swelling or pain is not relieved with medicines. MAKE SURE YOU:   Understand these instructions.  Will watch your condition.  Will get help right away if you are not doing well or get worse. Document Released: 06/24/2001 Document Revised: 06/23/2012 Document Reviewed: 03/22/2012 [facility] Patient Information 2015 [place], [place]. This information is not intended to replace advice given to you by your health care provider. Make sure you discuss any questions you have with your health care provider.

## 2014-04-12 NOTE — ED Notes (Signed)
I gave the patient a cup of ice water. 
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
## 2014-04-12 NOTE — ED Notes (Signed)
Pt placed on nrb due to sats in the upper 80's family at bedside.

## 2014-04-18 ENCOUNTER — Other Ambulatory Visit: Payer: Self-pay | Admitting: *Deleted

## 2014-04-18 MED ORDER — DIGOXIN 125 MCG PO TABS: 0.1250 mg | ORAL_TABLET | Freq: Every day | ORAL | Status: DC

## 2014-04-18 MED ORDER — FUROSEMIDE 40 MG PO TABS: 40.0000 mg | ORAL_TABLET | Freq: Every day | ORAL | Status: DC

## 2014-04-18 MED ORDER — POTASSIUM CHLORIDE CRYS ER 20 MEQ PO TBCR: 20.0000 meq | EXTENDED_RELEASE_TABLET | Freq: Every day | ORAL | Status: DC

## 2014-04-18 MED ORDER — CARVEDILOL 25 MG PO TABS: 25.0000 mg | ORAL_TABLET | Freq: Two times a day (BID) | ORAL | Status: DC

## 2014-04-18 MED ORDER — LISINOPRIL 20 MG PO TABS: 20.0000 mg | ORAL_TABLET | Freq: Every day | ORAL | Status: DC

## 2014-04-19 ENCOUNTER — Other Ambulatory Visit: Payer: Self-pay

## 2014-04-19 MED ORDER — SIMVASTATIN 20 MG PO TABS: 10.0000 mg | ORAL_TABLET | Freq: Every day | ORAL | Status: DC

## 2014-04-20 ENCOUNTER — Other Ambulatory Visit: Payer: Self-pay

## 2014-04-20 MED ORDER — SIMVASTATIN 20 MG PO TABS: 10.0000 mg | ORAL_TABLET | Freq: Every day | ORAL | Status: DC

## 2014-05-11 ENCOUNTER — Other Ambulatory Visit: Payer: Self-pay | Admitting: Nurse Practitioner

## 2014-05-11 ENCOUNTER — Ambulatory Visit
Admission: RE | Admit: 2014-05-11 | Discharge: 2014-05-11 | Disposition: A | Discharge status: Home / Self Care | Payer: Medicare Other | Source: Ambulatory Visit | Attending: Nurse Practitioner | Admitting: Nurse Practitioner

## 2014-05-11 DIAGNOSIS — M25562 Pain in left knee: Secondary | ICD-10-CM

## 2014-05-11 DIAGNOSIS — R609 Edema, unspecified: Secondary | ICD-10-CM

## 2014-10-03 ENCOUNTER — Encounter: Payer: Self-pay | Admitting: Internal Medicine

## 2014-10-11 ENCOUNTER — Other Ambulatory Visit: Payer: Self-pay | Admitting: *Deleted

## 2014-10-11 MED ORDER — LISINOPRIL 20 MG PO TABS: 20.0000 mg | ORAL_TABLET | Freq: Every day | ORAL | Status: DC

## 2014-10-11 MED ORDER — DIGOXIN 125 MCG PO TABS: 0.1250 mg | ORAL_TABLET | Freq: Every day | ORAL | Status: DC

## 2014-10-11 MED ORDER — FUROSEMIDE 40 MG PO TABS: 40.0000 mg | ORAL_TABLET | Freq: Every day | ORAL | Status: DC

## 2014-10-11 MED ORDER — POTASSIUM CHLORIDE CRYS ER 20 MEQ PO TBCR: 20.0000 meq | EXTENDED_RELEASE_TABLET | Freq: Every day | ORAL | Status: DC
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
## 2014-11-16 ENCOUNTER — Encounter: Payer: Self-pay | Admitting: Internal Medicine

## 2014-11-16 ENCOUNTER — Ambulatory Visit (INDEPENDENT_AMBULATORY_CARE_PROVIDER_SITE_OTHER): Payer: Medicare Other | Admitting: Internal Medicine

## 2014-11-16 VITALS — BP 112/62 | HR 76 | Ht 66.0 in | Wt 123.1 lb

## 2014-11-16 DIAGNOSIS — I5022 Chronic systolic (congestive) heart failure: Secondary | ICD-10-CM

## 2014-11-16 NOTE — Patient Instructions (Signed)
Your physician wants you to follow-up in: 9 months with Dr. Harrington Challenger. You will receive a reminder letter in the mail two months in advance. If you don't receive a letter, please call our office to schedule the follow-up appointment.  Your physician recommends that you continue on your current medications as directed. Please refer to the Current Medication list given to you today.  Dr. Thornell Mule- [phone]- ENT

## 2015-01-12 ENCOUNTER — Other Ambulatory Visit: Payer: Self-pay

## 2015-01-12 MED ORDER — CARVEDILOL 25 MG PO TABS: 25.0000 mg | ORAL_TABLET | Freq: Two times a day (BID) | ORAL | Status: DC

## 2015-04-12 ENCOUNTER — Other Ambulatory Visit: Payer: Self-pay

## 2015-04-12 MED ORDER — FUROSEMIDE 40 MG PO TABS: 40.0000 mg | ORAL_TABLET | Freq: Every day | ORAL | Status: DC

## 2015-04-12 MED ORDER — LISINOPRIL 20 MG PO TABS: 20.0000 mg | ORAL_TABLET | Freq: Every day | ORAL | Status: DC

## 2015-04-17 ENCOUNTER — Other Ambulatory Visit: Payer: Self-pay

## 2015-04-17 MED ORDER — DIGOXIN 125 MCG PO TABS: 0.1250 mg | ORAL_TABLET | Freq: Every day | ORAL | Status: DC

## 2015-04-17 MED ORDER — POTASSIUM CHLORIDE CRYS ER 20 MEQ PO TBCR: 20.0000 meq | EXTENDED_RELEASE_TABLET | Freq: Every day | ORAL | Status: DC
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
## 2015-06-13 ENCOUNTER — Other Ambulatory Visit: Payer: Self-pay | Admitting: *Deleted

## 2015-06-13 MED ORDER — CARVEDILOL 25 MG PO TABS: 25.0000 mg | ORAL_TABLET | Freq: Two times a day (BID) | ORAL | Status: DC

## 2015-08-30 ENCOUNTER — Encounter: Payer: Self-pay | Admitting: Internal Medicine

## 2015-08-30 ENCOUNTER — Ambulatory Visit (INDEPENDENT_AMBULATORY_CARE_PROVIDER_SITE_OTHER): Payer: Medicare Other | Admitting: Internal Medicine

## 2015-08-30 VITALS — BP 137/77 | HR 61 | Ht 66.0 in | Wt 137.0 lb

## 2015-08-30 DIAGNOSIS — I10 Essential (primary) hypertension: Secondary | ICD-10-CM | POA: Diagnosis not present

## 2015-08-30 DIAGNOSIS — E785 Hyperlipidemia, unspecified: Secondary | ICD-10-CM | POA: Diagnosis not present

## 2015-08-30 LAB — BASIC METABOLIC PANEL
BUN: 13 mg/dL (ref 7–25)
CO2: 26 mmol/L (ref 20–31)
Calcium: 9.2 mg/dL (ref 8.6–10.3)
Chloride: 101 mmol/L (ref 98–110)
Creat: 1.06 mg/dL (ref 0.70–1.25)
Glucose, Bld: 82 mg/dL (ref 65–99)
Potassium: 4.6 mmol/L (ref 3.5–5.3)
SODIUM: 138 mmol/L (ref 135–146)

## 2015-08-30 LAB — CBC
HEMATOCRIT: 47.6 % (ref 39.0–52.0)
Hemoglobin: 16.3 g/dL (ref 13.0–17.0)
MCH: 30.9 pg (ref 26.0–34.0)
MCHC: 34.2 g/dL (ref 30.0–36.0)
MCV: 90.2 fL (ref 78.0–100.0)
MPV: 10.1 fL (ref 8.6–12.4)
Platelets: 375 10*3/uL (ref 150–400)
RBC: 5.28 MIL/uL (ref 4.22–5.81)
RDW: 15 % (ref 11.5–15.5)
WBC: 10.1 10*3/uL (ref 4.0–10.5)

## 2015-08-30 LAB — LIPID PANEL
CHOLESTEROL: 199 mg/dL (ref 125–200)
HDL: 35 mg/dL — AB (ref >=40)
LDL CALC: 143 mg/dL — AB (ref <130)
Total CHOL/HDL Ratio: 5.7 Ratio — ABNORMAL HIGH (ref <=5.0)
Triglycerides: 106 mg/dL (ref <150)
VLDL: 21 mg/dL (ref <30)

## 2015-08-30 LAB — TSH: TSH: 0.904 u[IU]/mL (ref 0.350–4.500)

## 2015-08-30 NOTE — Progress Notes (Signed)
Cardiology Office Note   Date:  08/30/2015   ID:  Kristopher [name], DOB [birth year], MRN [id number]  PCP:  Delia Chimes, NP  Cardiologist:   Dorris Carnes, MD   No chief complaint on file.  F/U of NICM   History of Present Illness: Kristopher [name] is a 69 y.o. male with a history of Patient is a 69 yo with a history of NICM (cath in 2012 normal coronary arteries; LVEF 20%)LVEF on last echo was 40% Since I saw him last winter he says he is doing great  Feels good  No SOB  No diziness no CP  Active Smokes 2 cigs per day   Has a lady friend  Current Outpatient Prescriptions  Medication Sig Dispense Refill  . ALPRAZolam (XANAX) 0.5 MG tablet Take 0.5 mg by mouth 2 (two) times daily as needed for anxiety.    . carvedilol (COREG) 25 MG tablet Take 1 tablet (25 mg total) by mouth 2 (two) times daily with a meal. 180 tablet 2  . digoxin (LANOXIN) 0.125 MG tablet Take 1 tablet (0.125 mg total) by mouth daily. 90 tablet 1  . FLUoxetine (PROZAC) 20 MG capsule Take 20 mg by mouth daily.    . furosemide (LASIX) 40 MG tablet Take 1 tablet (40 mg total) by mouth daily. 90 tablet 2  . HYDROcodone-acetaminophen (NORCO) 5-325 MG per tablet Take 1 tablet by mouth every 4 (four) hours as needed for pain. 40 tablet 0  . lisinopril (PRINIVIL,ZESTRIL) 20 MG tablet Take 1 tablet (20 mg total) by mouth daily. 90 tablet 2  . potassium chloride SA (K-DUR,KLOR-CON) 20 MEQ tablet Take 1 tablet (20 mEq total) by mouth daily. 90 tablet 1  . predniSONE (DELTASONE) 50 MG tablet 1 tablet PO daily 5 tablet 0   No current facility-administered medications for this visit.    Allergies:   Review of patient's allergies indicates no known allergies.   Past Medical History  Diagnosis Date  . Dyslipidemia     takes Simvastatin daily  . Cardiomyopathy     takes Digoxin daily  . Anxiety     takes Xanax prn  . Depression     takes Prozac daily  . HTN (hypertension)     takes Carvedilol and Lisinopril daily  .  Impaired hearing     left    Past Surgical History  Procedure Laterality Date  . Splenectomy      >37yrs ago  . Kidney removed      but unsure of which one;>76yrs ago  . Cholecystectomy      > 48yrs ago  . Hernia repair    . Colonoscopy    . Mass excision Left 06/14/2013    Procedure: EXCISION of left chest wall MASS;  Surgeon: Madilyn Hook, DO;  Location: [facility];  Service: General;  Laterality: Left;     Social History:  The patient  reports that he has been smoking Cigarettes.  He has a 50 pack-year smoking history. He does not have any smokeless tobacco history on file. He reports that he does not drink alcohol or use illicit drugs.   Family History:  The patient's family history includes Cancer in his mother; Heart attack in his brother and sister. There is no history of Stroke.    ROS:  Please see the history of present illness. All other systems are reviewed and  Negative to the above problem except as noted.    PHYSICAL EXAM: VS:  BP 137/77 mmHg  Pulse 61  Ht 5\' 6"  (1.676 m)  Wt 62.143 kg (137 lb)  BMI 22.12 kg/m2  GEN: Well nourished, well developed, in no acute distress HEENT: normal Neck: no JVD, carotid bruits, or masses Cardiac: RRR; no murmurs, rubs, or gallops,no edema  Respiratory:  clear to auscultation bilaterally, normal work of breathing GI: soft, nontender, nondistended, + BS  No hepatomegaly  MS: no deformity Moving all extremities   Skin: warm and dry, no rash Neuro:  Strength and sensation are intact Psych: euthymic mood, full affect   EKG:  EKG is ordered today.SR 61  First degree AV block  PR 208  LBBB   Lipid Panel    Component Value Date/Time   CHOL 96 02/16/2014 1426   TRIG 93.0 02/16/2014 1426   HDL 38.30* 02/16/2014 1426   CHOLHDL 3 02/16/2014 1426   VLDL 18.6 02/16/2014 1426   LDLCALC 39 02/16/2014 1426   LDLDIRECT 138.1 04/05/2007 1622      Wt Readings from Last 3 Encounters:  08/30/15 62.143 kg (137 lb)  11/16/14 55.847 kg  (123 lb 1.9 oz)  02/16/14 58.968 kg (130 lb)      ASSESSMENT AND PLAN:  1  NICM  Volume status looks good  Pt is feeling great  Would keep on same regimen  Check labs    F/U in June   2  LBBB  Old     Signed, Dorris Carnes, MD  08/30/2015 12:06 PM    [facility], [place], [place]  [address] Phone: [phone]; Fax: [phone]

## 2015-08-30 NOTE — Patient Instructions (Signed)
Your physician recommends that you continue on your current medications as directed. Please refer to the Current Medication list given to you today. Your physician recommends that you return for lab work in: today (bmet, cbc, tsh, lipids)  Your physician wants you to follow-up in: June 2017 with Dr. Harrington Challenger.  You will receive a reminder letter in the mail two months in advance. If you don't receive a letter, please call our office to schedule the follow-up appointment.

## 2015-08-31 ENCOUNTER — Telehealth: Payer: Self-pay | Admitting: *Deleted

## 2015-08-31 NOTE — Telephone Encounter (Signed)
Mailbox full unable to leave a message.

## 2015-08-31 NOTE — Telephone Encounter (Signed)
-----   Message from [name], MD sent at 08/31/2015 11:32 AM EST ----- CBC, Electrolytes aro normal Thyroid function is normal Lipds have been better.  He was on simvistation in past  I would take this 20 mg  He seemed to tolerate LDL is 143  Do not want any plaque build up in arteries.

## 2015-09-03 MED ORDER — CARVEDILOL 25 MG PO TABS: 25.0000 mg | ORAL_TABLET | Freq: Two times a day (BID) | ORAL | Status: DC

## 2015-09-03 MED ORDER — SIMVASTATIN 20 MG PO TABS: 20.0000 mg | ORAL_TABLET | Freq: Every day | ORAL | Status: DC

## 2015-09-03 NOTE — Telephone Encounter (Signed)
Spoke with the patient. He will start back on simvastatin 20 mg. Also requested refill of coreg.

## 2015-10-25 ENCOUNTER — Other Ambulatory Visit: Payer: Self-pay | Admitting: Internal Medicine

## 2015-10-25 MED ORDER — POTASSIUM CHLORIDE CRYS ER 20 MEQ PO TBCR: 20.0000 meq | EXTENDED_RELEASE_TABLET | Freq: Every day | ORAL | Status: DC

## 2015-11-12 ENCOUNTER — Other Ambulatory Visit: Payer: Self-pay | Admitting: *Deleted

## 2015-11-12 MED ORDER — DIGOXIN 125 MCG PO TABS
0.1250 mg | ORAL_TABLET | Freq: Every day | ORAL | Status: DC
Start: 2015-11-12 — End: 2016-03-19

## 2016-03-17 ENCOUNTER — Ambulatory Visit (INDEPENDENT_AMBULATORY_CARE_PROVIDER_SITE_OTHER): Payer: Medicare Other | Admitting: Internal Medicine

## 2016-03-17 ENCOUNTER — Encounter: Payer: Self-pay | Admitting: Internal Medicine

## 2016-03-17 VITALS — BP 140/63 | HR 65 | Ht 66.0 in | Wt 137.6 lb

## 2016-03-17 DIAGNOSIS — E785 Hyperlipidemia, unspecified: Secondary | ICD-10-CM | POA: Diagnosis not present

## 2016-03-17 DIAGNOSIS — I10 Essential (primary) hypertension: Secondary | ICD-10-CM

## 2016-03-17 DIAGNOSIS — Z79899 Other long term (current) drug therapy: Secondary | ICD-10-CM

## 2016-03-17 LAB — CBC
HCT: 48.6 % (ref 38.5–50.0)
Hemoglobin: 16.5 g/dL (ref 13.2–17.1)
MCH: 30.3 pg (ref 27.0–33.0)
MCHC: 34 g/dL (ref 32.0–36.0)
MCV: 89.2 fL (ref 80.0–100.0)
MPV: 9.9 fL (ref 7.5–12.5)
PLATELETS: 343 10*3/uL (ref 140–400)
RBC: 5.45 MIL/uL (ref 4.20–5.80)
RDW: 15.5 % — AB (ref 11.0–15.0)
WBC: 11 10*3/uL — AB (ref 3.8–10.8)

## 2016-03-17 LAB — BASIC METABOLIC PANEL
BUN: 11 mg/dL (ref 7–25)
CHLORIDE: 100 mmol/L (ref 98–110)
CO2: 28 mmol/L (ref 20–31)
CREATININE: 1.14 mg/dL (ref 0.70–1.18)
Calcium: 8.7 mg/dL (ref 8.6–10.3)
Glucose, Bld: 72 mg/dL (ref 65–99)
Potassium: 4.1 mmol/L (ref 3.5–5.3)
Sodium: 139 mmol/L (ref 135–146)

## 2016-03-17 LAB — LIPID PANEL
Cholesterol: 131 mg/dL (ref 125–200)
HDL: 31 mg/dL — AB (ref >=40)
LDL Cholesterol: 66 mg/dL (ref <130)
Total CHOL/HDL Ratio: 4.2 Ratio (ref <=5.0)
Triglycerides: 172 mg/dL — ABNORMAL HIGH (ref <150)
VLDL: 34 mg/dL — ABNORMAL HIGH (ref <30)

## 2016-03-17 LAB — DIGOXIN LEVEL: DIGOXIN LVL: 0.7 ug/L — AB (ref 0.8–2.0)

## 2016-03-17 NOTE — Patient Instructions (Signed)
Your physician recommends that you continue on your current medications as directed. Please refer to the Current Medication list given to you today.  Your physician recommends that you return for lab work today (CBC, LIPID, BMET, [place])  Your physician wants you to follow-up in: [place].  You will receive a reminder letter in the mail two months in advance. If you don't receive a letter, please call our office to schedule the follow-up appointment.

## 2016-03-17 NOTE — Progress Notes (Signed)
Cardiology Office Note   Date:  03/17/2016   ID:  Kristopher [name], DOB November 15, 1945, MRN [id number]  PCP:  Delia Chimes, NP  Cardiologist:   Dorris Carnes, MD   F/U of NICM     History of Present Illness: Kristopher [name] is a 70 y.o. male with a history of  NICM (cath in 2012 normal coronary arteries; LVEF 20%)Last echo LVEF was 40%   I saw him about 1 year ago  SInce seen he has done well from a cardiac standpoint  Breathing is good  No CP  No dizziness No palpitations  No edema WOrks in [employer]      Outpatient Prescriptions Prior to Visit  Medication Sig Dispense Refill  . ALPRAZolam (XANAX) 0.5 MG tablet Take 0.5 mg by mouth 2 (two) times daily as needed for anxiety.    . carvedilol (COREG) 25 MG tablet Take 1 tablet (25 mg total) by mouth 2 (two) times daily with a meal. 180 tablet 3  . digoxin (LANOXIN) 0.125 MG tablet Take 1 tablet (0.125 mg total) by mouth daily. 90 tablet 2  . FLUoxetine (PROZAC) 20 MG capsule Take 20 mg by mouth daily.    . furosemide (LASIX) 40 MG tablet Take 1 tablet (40 mg total) by mouth daily. 90 tablet 2  . HYDROcodone-acetaminophen (NORCO) 5-325 MG per tablet Take 1 tablet by mouth every 4 (four) hours as needed for pain. 40 tablet 0  . lisinopril (PRINIVIL,ZESTRIL) 20 MG tablet Take 1 tablet (20 mg total) by mouth daily. 90 tablet 2  . potassium chloride SA (K-DUR,KLOR-CON) 20 MEQ tablet Take 1 tablet (20 mEq total) by mouth daily. 90 tablet 1  . predniSONE (DELTASONE) 50 MG tablet 1 tablet PO daily 5 tablet 0  . simvastatin (ZOCOR) 20 MG tablet Take 1 tablet (20 mg total) by mouth at bedtime. 90 tablet 3   No facility-administered medications prior to visit.     Allergies:   Review of patient's allergies indicates no known allergies.   Past Medical History  Diagnosis Date  . Dyslipidemia     takes Simvastatin daily  . Cardiomyopathy     takes Digoxin daily  . Anxiety     takes Xanax prn  . Depression     takes Prozac daily  . HTN  (hypertension)     takes Carvedilol and Lisinopril daily  . Impaired hearing     left    Past Surgical History  Procedure Laterality Date  . Splenectomy      >36yrs ago  . Kidney removed      but unsure of which one;>58yrs ago  . Cholecystectomy      > 22yrs ago  . Hernia repair    . Colonoscopy    . Mass excision Left 06/14/2013    Procedure: EXCISION of left chest wall MASS;  Surgeon: Madilyn Hook, DO;  Location: [facility];  Service: General;  Laterality: Left;     Social History:  The patient  reports that he has been smoking Cigarettes.  He has a 50 pack-year smoking history. He does not have any smokeless tobacco history on file. He reports that he does not drink alcohol or use illicit drugs.   Family History:  The patient's family history includes Cancer in his mother; Heart attack in his brother and sister. There is no history of Stroke.    ROS:  Please see the history of present illness. All other systems are reviewed and  Negative  to the above problem except as noted.    PHYSICAL EXAM: VS:  BP 140/63 mmHg  Pulse 65  Ht 5\' 6"  (1.676 m)  Wt 137 lb 9.6 oz (62.415 kg)  BMI 22.22 kg/m2  GEN: Thin 70 yo , in no acute distress HEENT: normal Neck: no JVD, carotid bruits, or masses Cardiac: RRR; no murmurs, rubs, or gallops,no edema  Respiratory:  clear to auscultation bilaterally, normal work of breathing GI: soft, nontender, nondistended, + BS  No hepatomegaly  MS: no deformity Moving all extremities   Skin: warm and dry, no rash Neuro:  Strength and sensation are intact Psych: euthymic mood, full affect   EKG:  EKG is not ordered today.   Lipid Panel    Component Value Date/Time   CHOL 199 08/30/2015 1233   TRIG 106 08/30/2015 1233   HDL 35* 08/30/2015 1233   CHOLHDL 5.7* 08/30/2015 1233   VLDL 21 08/30/2015 1233   LDLCALC 143* 08/30/2015 1233   LDLDIRECT 138.1 04/05/2007 1622      Wt Readings from Last 3 Encounters:  03/17/16 137 lb 9.6 oz (62.415 kg)    08/30/15 137 lb (62.143 kg)  11/16/14 123 lb 1.9 oz (55.847 kg)      ASSESSMENT AND PLAN:  1  NICM  Last echo showed LVEF was 40%  Exam  Volume is good  I would keep on same meds  Check Dig level,  BMET and CBC  2.  HL  Check lpipds  Keep on simvistatin    Encouraged him to stay active  I would recomm F/u December or January    Signed, Dorris Carnes, MD  03/17/2016 12:19 PM    [facility] [place], [place], [place]  [address] Phone: [phone]; Fax: [phone]

## 2016-03-19 ENCOUNTER — Other Ambulatory Visit: Payer: Self-pay | Admitting: *Deleted

## 2016-04-24 ENCOUNTER — Other Ambulatory Visit: Payer: Self-pay

## 2016-04-24 MED ORDER — FUROSEMIDE 40 MG PO TABS: 40.0000 mg | ORAL_TABLET | Freq: Every day | ORAL | Status: DC

## 2016-04-24 MED ORDER — LISINOPRIL 20 MG PO TABS: 20.0000 mg | ORAL_TABLET | Freq: Every day | ORAL | Status: DC

## 2016-04-24 MED ORDER — POTASSIUM CHLORIDE CRYS ER 20 MEQ PO TBCR: 20.0000 meq | EXTENDED_RELEASE_TABLET | Freq: Every day | ORAL | Status: DC

## 2016-06-04 ENCOUNTER — Other Ambulatory Visit: Payer: Self-pay

## 2016-06-04 MED ORDER — SIMVASTATIN 20 MG PO TABS
20.0000 mg | ORAL_TABLET | Freq: Every day | ORAL | 3 refills | Status: DC
Start: 2016-06-04 — End: 2017-06-22

## 2016-06-04 MED ORDER — CARVEDILOL 25 MG PO TABS
25.0000 mg | ORAL_TABLET | Freq: Two times a day (BID) | ORAL | 3 refills | Status: DC
Start: 2016-06-04 — End: 2017-06-20

## 2016-10-04 ENCOUNTER — Other Ambulatory Visit: Payer: Self-pay | Admitting: Internal Medicine

## 2016-10-22 ENCOUNTER — Other Ambulatory Visit: Payer: Self-pay | Admitting: Internal Medicine

## 2016-11-12 ENCOUNTER — Telehealth: Payer: Self-pay | Admitting: Internal Medicine

## 2016-11-12 NOTE — Telephone Encounter (Signed)
Left a message for Hinton Dyer at Dr. Corky Sing office to call back.

## 2016-11-12 NOTE — Telephone Encounter (Signed)
New Message    *STAT* If patient is at the pharmacy, call can be transferred to refill team.   1. Which medications need to be refilled? (please list name of each medication and dose if known)   ALPRAZolam (XANAX) 0.5 MG tablet   [place] 350 MG  2. Which pharmacy/location (including street and city if local pharmacy) is medication to be sent to? [place]  3. Do they need a 30 day or 90 day supply? 90 day for xanax/30 day for [facility] Doctor is out for two months, and they were instructed to reach out to Dr. Harrington Challenger. Per daughter any questions ask for [facility] @ Dr. Toy Cookey Office [phone]

## 2016-11-13 NOTE — Telephone Encounter (Signed)
Follow up   *STAT* If patient is at the pharmacy, call can be transferred to refill team.   1. Which medications need to be refilled? (please list name of each medication and dose if known) Fluoxetine (Prozac) 20 mg capsule once daily  2. Which pharmacy/location (including street and city if local pharmacy) is medication to be sent to? [facility], [address] [place], [place]  3. Do they need a 30 day or 90 day supply? 30 day supply  Pts Daughter verbalized MD-Fuller's office is a new MD and the previous MD just up and quit, daughter was instructed to give [place] a call per MD-Fuller's office due to that office not having any of our patients records and having to reestablish.  Pts daughter voiced for [place] to contact the pharmacy per MD-Fuller.   Pts daughter voiced pt is currently out of his medication

## 2016-11-14 MED ORDER — ALPRAZOLAM 0.5 MG PO TABS: 0.5000 mg | ORAL_TABLET | Freq: Two times a day (BID) | ORAL | 0 refills | Status: DC | PRN

## 2016-11-14 MED ORDER — FLUOXETINE HCL 20 MG PO CAPS: 20.0000 mg | ORAL_CAPSULE | Freq: Every day | ORAL | 3 refills | Status: DC

## 2016-11-14 NOTE — Telephone Encounter (Signed)
I wiill fill 3 months of prozac Give 20 tablets of Xarnax    Pt has to find primary care physician for long term

## 2016-11-14 NOTE — Telephone Encounter (Signed)
Follow up   Pt verbalized that he still needs his medication and that he has not herd from rn

## 2016-11-14 NOTE — Telephone Encounter (Signed)
Spoke with patient's daughter and notified that 3 months of Prozac and 20 tablets of Xanax were sent to the patient's preferred pharmacy. Patient's daughter was also advised that the patient needs to find a new PCP for further refills and long term care. The daughter verbalized understanding.

## 2016-11-14 NOTE — Telephone Encounter (Signed)
Patient's daughter calling asking for refills for Xanax and Prozac for her father. She states that her father's PCP is no longer in practice. Daughter informed that the message would be sent to Dr. Harrington Challenger for approval. Daughter verbalized understanding.

## 2016-11-20 ENCOUNTER — Other Ambulatory Visit: Payer: Self-pay | Admitting: Internal Medicine

## 2016-11-20 NOTE — Telephone Encounter (Signed)
Per Dr. Harrington Challenger patient must refill through PCP for long term use.  Rx called in today 11/20/16 at 5:05pm.  It did not go through via e-fax 11/14/16

## 2016-12-12 ENCOUNTER — Other Ambulatory Visit: Payer: Self-pay | Admitting: Internal Medicine

## 2017-01-27 ENCOUNTER — Other Ambulatory Visit: Payer: Self-pay | Admitting: Internal Medicine

## 2017-02-20 ENCOUNTER — Other Ambulatory Visit: Payer: Self-pay | Admitting: Internal Medicine

## 2017-02-21 ENCOUNTER — Other Ambulatory Visit: Payer: Self-pay | Admitting: Internal Medicine

## 2017-04-09 ENCOUNTER — Other Ambulatory Visit: Payer: Self-pay | Admitting: Internal Medicine

## 2017-04-16 NOTE — Progress Notes (Signed)
Cardiology Office Note   Date:  04/17/2017   ID:  Kristopher [name], DOB Dec 13, 1945, MRN [id number]  PCP:  Delia Chimes, NP (Inactive)  Cardiologist:   Dorris Carnes, MD   F/U of systolic CHF      History of Present Illness: Kristopher [name] is a 71 y.o. male with a history of NICM (cath 2012 normal, LVEF 20%)  Last echo LVEF40%   I saw him in June 2017    Breathing is OK  No problems  With chest tightness  No edema   No dizziness        Current Meds  Medication Sig  . ALPRAZolam (XANAX) 0.5 MG tablet Take 1 tablet (0.5 mg total) by mouth 2 (two) times daily as needed for anxiety.  . carvedilol (COREG) 25 MG tablet Take 1 tablet (25 mg total) by mouth 2 (two) times daily with a meal.  . FLUoxetine (PROZAC) 20 MG capsule Take 1 capsule (20 mg total) by mouth daily.  . furosemide (LASIX) 40 MG tablet TAKE 1 TABLET BY MOUTH ONCE DAILY  . HYDROcodone-acetaminophen (NORCO) 5-325 MG per tablet Take 1 tablet by mouth every 4 (four) hours as needed for pain.  Marland Kitchen lisinopril (PRINIVIL,ZESTRIL) 20 MG tablet TAKE 1 TABLET BY MOUTH ONCE A DAY  . potassium chloride SA (K-DUR,KLOR-CON) 20 MEQ tablet TAKE 1 TABLET BY MOUTH ONCE A DAY  . predniSONE (DELTASONE) 50 MG tablet 1 tablet PO daily  . simvastatin (ZOCOR) 20 MG tablet Take 1 tablet (20 mg total) by mouth at bedtime.     Allergies:   Patient has no known allergies.   Past Medical History:  Diagnosis Date  . Anxiety    takes Xanax prn  . Cardiomyopathy    takes Digoxin daily  . Depression    takes Prozac daily  . Dyslipidemia    takes Simvastatin daily  . HTN (hypertension)    takes Carvedilol and Lisinopril daily  . Impaired hearing    left    Past Surgical History:  Procedure Laterality Date  . CHOLECYSTECTOMY     > 34yrs ago  . COLONOSCOPY    . HERNIA REPAIR    . kidney removed     but unsure of which one;>26yrs ago  . MASS EXCISION Left 06/14/2013   Procedure: EXCISION of left chest wall MASS;  Surgeon: Madilyn Hook,  DO;  Location: [place];  Service: General;  Laterality: Left;  . SPLENECTOMY     >80yrs ago     Social History:  The patient  reports that he has been smoking Cigarettes.  He has a 50.00 pack-year smoking history. He has never used smokeless tobacco. He reports that he does not drink alcohol or use drugs.   Family History:  The patient's family history includes Cancer in his mother; Heart attack in his brother and sister.    ROS:  Please see the history of present illness. All other systems are reviewed and  Negative to the above problem except as noted.    PHYSICAL EXAM: VS:  BP 100/60 (BP Location: Left Arm, Patient Position: Sitting, Cuff Size: Normal)   Pulse (!) 56   Ht 5\' 6"  (1.676 m)   Wt 63 kg (139 lb)   SpO2 92%   BMI 22.44 kg/m   GEN: Thin 71 yo in no acute distress  HEENT: normal  Neck: no JVD, carotid bruits, or masses Cardiac: RRR; no murmurs, rubs, or gallops,no edema  Respiratory:  clear to auscultation  bilaterally, normal work of breathing GI: soft, nontender, nondistended, + BS  No hepatomegaly  MS: no deformity Moving all extremities   Skin: warm and dry, no rash Neuro:  Strength and sensation are intact Psych: euthymic mood, full affect   EKG:  EKG is ordered today.   Lipid Panel    Component Value Date/Time   CHOL 131 03/17/2016 1246   TRIG 172 (H) 03/17/2016 1246   HDL 31 (L) 03/17/2016 1246   CHOLHDL 4.2 03/17/2016 1246   VLDL 34 (H) 03/17/2016 1246   LDLCALC 66 03/17/2016 1246   LDLDIRECT 138.1 04/05/2007 1622      Wt Readings from Last 3 Encounters:  04/17/17 63 kg (139 lb)  03/17/16 62.4 kg (137 lb 9.6 oz)  08/30/15 62.1 kg (137 lb)      ASSESSMENT AND PLAN:  1  Chronic systolic CHF   I have reviewed echo from a couple years ago   I think the  LVEF is better than reported of 40% Keep on same regimen  WIll get labs from primary md  2  HL  Continue meds    3  HTN  COntinue meds     Current medicines are reviewed at length with  the patient today.  The patient does not have concerns regarding medicines.  Signed, Dorris Carnes, MD  04/17/2017 10:12 AM    [facility], [place], [place]  [address] Phone: [phone]; Fax: [phone]

## 2017-04-17 ENCOUNTER — Encounter (INDEPENDENT_AMBULATORY_CARE_PROVIDER_SITE_OTHER): Payer: Self-pay

## 2017-04-17 ENCOUNTER — Ambulatory Visit (INDEPENDENT_AMBULATORY_CARE_PROVIDER_SITE_OTHER): Payer: Medicare Other | Admitting: Internal Medicine

## 2017-04-17 ENCOUNTER — Encounter: Payer: Self-pay | Admitting: Internal Medicine

## 2017-04-17 VITALS — BP 100/60 | HR 56 | Ht 66.0 in | Wt 139.0 lb

## 2017-04-17 DIAGNOSIS — I10 Essential (primary) hypertension: Secondary | ICD-10-CM

## 2017-04-17 DIAGNOSIS — E785 Hyperlipidemia, unspecified: Secondary | ICD-10-CM | POA: Diagnosis not present

## 2017-04-17 NOTE — Patient Instructions (Addendum)
Medication Instructions:    Your physician recommends that you continue on your current medications as directed. Please refer to the Current Medication list given to you today.  - If you need a refill on your cardiac medications before your next appointment, please call your pharmacy.   Labwork:  None ordered  Testing/Procedures:  None ordered  Follow-Up:  Your physician wants you to follow-up in: 9 months with Dr. Harrington Challenger. You will receive a reminder letter in the mail two months in advance. If you don't receive a letter, please call our office to schedule the follow-up appointment.  Thank you for choosing [facility]!!

## 2017-05-09 ENCOUNTER — Other Ambulatory Visit: Payer: Self-pay | Admitting: Internal Medicine

## 2017-05-11 ENCOUNTER — Other Ambulatory Visit: Payer: Self-pay | Admitting: Internal Medicine

## 2017-06-20 ENCOUNTER — Other Ambulatory Visit: Payer: Self-pay | Admitting: Internal Medicine

## 2017-06-22 ENCOUNTER — Other Ambulatory Visit: Payer: Self-pay | Admitting: Internal Medicine

## 2017-12-03 ENCOUNTER — Other Ambulatory Visit: Payer: Self-pay | Admitting: Internal Medicine

## 2018-02-12 ENCOUNTER — Other Ambulatory Visit: Payer: Self-pay | Admitting: Internal Medicine

## 2018-02-12 ENCOUNTER — Ambulatory Visit (INDEPENDENT_AMBULATORY_CARE_PROVIDER_SITE_OTHER): Payer: Medicare Other | Admitting: Internal Medicine

## 2018-02-12 ENCOUNTER — Encounter: Payer: Self-pay | Admitting: Internal Medicine

## 2018-02-12 VITALS — BP 114/66 | HR 72 | Ht 67.0 in | Wt 145.8 lb

## 2018-02-12 DIAGNOSIS — E785 Hyperlipidemia, unspecified: Secondary | ICD-10-CM | POA: Diagnosis not present

## 2018-02-12 DIAGNOSIS — I10 Essential (primary) hypertension: Secondary | ICD-10-CM

## 2018-02-12 DIAGNOSIS — I5043 Acute on chronic combined systolic (congestive) and diastolic (congestive) heart failure: Secondary | ICD-10-CM | POA: Diagnosis not present

## 2018-02-12 NOTE — Progress Notes (Signed)
Cardiology Office Note   Date:  02/12/2018   ID:  ISOM KOCHAN, DOB February 05, 1946, MRN [id number]  PCP:  Alvester Chou, NP  Cardiologist:   Dorris Carnes, MD   F/U of systolic CHF      History of Present Illness: Kristopher [name] is a 72 y.o. male with a history of NICM (cath 2012 normal, LVEF 20%)  Last echo LVEF40%   I saw him in June 2018  Since seen in [place] he has done pretty well   He is bothered by allergies   Seen by primeary MD   On prednisone.   Denies CP    Wt is up   He is eating better      Works in [employer] well    Current Meds  Medication Sig  . ALPRAZolam (XANAX) 0.5 MG tablet Take 1 tablet (0.5 mg total) by mouth 2 (two) times daily as needed for anxiety.  . carvedilol (COREG) 25 MG tablet TAKE 1 TABLET BY MOUTH 2 TIMES DAILY WITH A MEAL  . FLUoxetine (PROZAC) 20 MG capsule Take 1 capsule (20 mg total) by mouth daily.  . furosemide (LASIX) 40 MG tablet TAKE 1 TABLET BY MOUTH ONCE A DAY  . HYDROcodone-acetaminophen (NORCO) 5-325 MG per tablet Take 1 tablet by mouth every 4 (four) hours as needed for pain.  Marland Kitchen lisinopril (PRINIVIL,ZESTRIL) 20 MG tablet TAKE 1 TABLET BY MOUTH ONCE A DAY  . potassium chloride SA (K-DUR,KLOR-CON) 20 MEQ tablet TAKE 1 TABLET BY MOUTH ONCE DAILY  . predniSONE (DELTASONE) 50 MG tablet 1 tablet PO daily  . simvastatin (ZOCOR) 20 MG tablet TAKE 1 TABLET BY MOUTH EVERY NIGHT AT BEDTIME     Allergies:   Patient has no known allergies.   Past Medical History:  Diagnosis Date  . Anxiety    takes Xanax prn  . Cardiomyopathy    takes Digoxin daily  . Depression    takes Prozac daily  . Dyslipidemia    takes Simvastatin daily  . HTN (hypertension)    takes Carvedilol and Lisinopril daily  . Impaired hearing    left    Past Surgical History:  Procedure Laterality Date  . CHOLECYSTECTOMY     > 17yrs ago  . COLONOSCOPY    . HERNIA REPAIR    . kidney removed     but unsure of which one;>71yrs ago  . MASS EXCISION Left  06/14/2013   Procedure: EXCISION of left chest wall MASS;  Surgeon: Madilyn Hook, DO;  Location: [facility];  Service: General;  Laterality: Left;  . SPLENECTOMY     >83yrs ago     Social History:  The patient  reports that he has been smoking cigarettes.  He has a 50.00 pack-year smoking history. He has never used smokeless tobacco. He reports that he does not drink alcohol or use drugs.   Family History:  The patient's family history includes Cancer in his mother; Heart attack in his brother and sister.    ROS:  Please see the history of present illness. All other systems are reviewed and  Negative to the above problem except as noted.    PHYSICAL EXAM: VS:  BP 114/66   Pulse 72   Ht 5\' 7"  (1.702 m)   Wt 145 lb 12.8 oz (66.1 kg)   BMI 22.84 kg/m   GEN: Thin 72 yo in no acute distress  HEENT: normal  Neck: no JVD, carotid bruits, or masses Cardiac:  RRR; no murmurs, rubs, or gallops,no edema  Respiratory: Wheezing bilaterally   GI: soft, nontender, nondistended, + BS  No hepatomegaly  MS: no deformity Moving all extremities   Skin: warm and dry, no rash Neuro:  Strength and sensation are intact Psych: euthymic mood, full affect   EKG:  EKG is not ordered today.   Lipid Panel    Component Value Date/Time   CHOL 131 03/17/2016 1246   TRIG 172 (H) 03/17/2016 1246   HDL 31 (L) 03/17/2016 1246   CHOLHDL 4.2 03/17/2016 1246   VLDL 34 (H) 03/17/2016 1246   LDLCALC 66 03/17/2016 1246   LDLDIRECT 138.1 04/05/2007 1622      Wt Readings from Last 3 Encounters:  02/12/18 145 lb 12.8 oz (66.1 kg)  04/17/17 139 lb (63 kg)  03/17/16 137 lb 9.6 oz (62.4 kg)      ASSESSMENT AND PLAN:  1   Wheezing   Diffuse wheeze on exam   Will get note from [facility] MD   On steroids  Not sure of taper.   1  Chronic systolic CHF   LVEF by my review 40%  OVlume status on exam looks pretty good   Would followI   2  HL  Continue meds    3  HTN Good control   Continue meds    Current  medicines are reviewed at length with the patient today.  The patient does not have concerns regarding medicines.  Signed, Dorris Carnes, MD  02/12/2018 12:04 PM    [facility] [address], [place], [place]  [address] Phone: [phone]; Fax: [phone]

## 2018-02-12 NOTE — Telephone Encounter (Signed)
Pt's pharmacy is requesting a refill on fluoxetine. Would Dr. Harrington Challenger like to refill this medication? Please address

## 2018-02-12 NOTE — Patient Instructions (Addendum)
Your physician recommends that you continue on your current medications as directed. Please refer to the Current Medication list given to you today.  We will call you after we get your notes from primary care.  02/15/18 addendum: Left detailed message on Back to [place], NP [phone] requesting last 1-2 office visit notes and any recent labs be faxed to Dr. Harrington Challenger at [phone].

## 2018-02-12 NOTE — Telephone Encounter (Signed)
Pt was seen today in office. Please refill for 90 days and then ask for further fills from PCP.

## 2018-05-01 ENCOUNTER — Other Ambulatory Visit: Payer: Self-pay | Admitting: Internal Medicine

## 2018-05-06 ENCOUNTER — Other Ambulatory Visit: Payer: Self-pay | Admitting: Internal Medicine

## 2018-05-12 ENCOUNTER — Other Ambulatory Visit: Payer: Self-pay | Admitting: Internal Medicine

## 2018-06-21 ENCOUNTER — Other Ambulatory Visit: Payer: Self-pay | Admitting: Internal Medicine

## 2018-10-08 ENCOUNTER — Other Ambulatory Visit: Payer: Self-pay | Admitting: Internal Medicine

## 2018-12-28 ENCOUNTER — Other Ambulatory Visit: Payer: Self-pay | Admitting: Internal Medicine

## 2019-01-27 ENCOUNTER — Other Ambulatory Visit: Payer: Self-pay | Admitting: Internal Medicine

## 2019-04-11 ENCOUNTER — Other Ambulatory Visit: Payer: Self-pay | Admitting: Adult Health

## 2019-04-11 ENCOUNTER — Ambulatory Visit
Admission: RE | Admit: 2019-04-11 | Discharge: 2019-04-11 | Disposition: A | Discharge status: Home / Self Care | Payer: Medicare Other | Source: Ambulatory Visit | Attending: Endocrinology | Admitting: Endocrinology

## 2019-04-11 ENCOUNTER — Other Ambulatory Visit: Payer: Self-pay

## 2019-04-11 DIAGNOSIS — R0689 Other abnormalities of breathing: Secondary | ICD-10-CM

## 2019-04-12 ENCOUNTER — Other Ambulatory Visit: Payer: Self-pay | Admitting: Internal Medicine

## 2019-05-25 ENCOUNTER — Inpatient Hospital Stay (HOSPITAL_COMMUNITY)
Admission: EM | Admit: 2019-05-25 | Discharge: 2019-06-03 | DRG: 871 | Disposition: A | Discharge status: Home Health | Payer: Medicare Other | Attending: Internal Medicine | Admitting: Internal Medicine

## 2019-05-25 ENCOUNTER — Other Ambulatory Visit: Payer: Self-pay

## 2019-05-25 ENCOUNTER — Emergency Department (HOSPITAL_COMMUNITY): Payer: Medicare Other

## 2019-05-25 ENCOUNTER — Inpatient Hospital Stay (HOSPITAL_COMMUNITY): Payer: Medicare Other

## 2019-05-25 ENCOUNTER — Encounter (HOSPITAL_COMMUNITY): Payer: Self-pay

## 2019-05-25 DIAGNOSIS — E43 Unspecified severe protein-calorie malnutrition: Secondary | ICD-10-CM | POA: Diagnosis present

## 2019-05-25 DIAGNOSIS — U071 COVID-19: Secondary | ICD-10-CM | POA: Diagnosis present

## 2019-05-25 DIAGNOSIS — Z6821 Body mass index (BMI) 21.0-21.9, adult: Secondary | ICD-10-CM | POA: Diagnosis not present

## 2019-05-25 DIAGNOSIS — R0902 Hypoxemia: Secondary | ICD-10-CM

## 2019-05-25 DIAGNOSIS — Z905 Acquired absence of kidney: Secondary | ICD-10-CM | POA: Diagnosis not present

## 2019-05-25 DIAGNOSIS — F17210 Nicotine dependence, cigarettes, uncomplicated: Secondary | ICD-10-CM | POA: Diagnosis present

## 2019-05-25 DIAGNOSIS — Z79899 Other long term (current) drug therapy: Secondary | ICD-10-CM

## 2019-05-25 DIAGNOSIS — I110 Hypertensive heart disease with heart failure: Secondary | ICD-10-CM | POA: Diagnosis present

## 2019-05-25 DIAGNOSIS — Z720 Tobacco use: Secondary | ICD-10-CM | POA: Diagnosis not present

## 2019-05-25 DIAGNOSIS — E860 Dehydration: Secondary | ICD-10-CM | POA: Diagnosis present

## 2019-05-25 DIAGNOSIS — J14 Pneumonia due to Hemophilus influenzae: Secondary | ICD-10-CM | POA: Diagnosis present

## 2019-05-25 DIAGNOSIS — A4189 Other specified sepsis: Principal | ICD-10-CM | POA: Diagnosis present

## 2019-05-25 DIAGNOSIS — R1310 Dysphagia, unspecified: Secondary | ICD-10-CM | POA: Diagnosis present

## 2019-05-25 DIAGNOSIS — R531 Weakness: Secondary | ICD-10-CM | POA: Diagnosis not present

## 2019-05-25 DIAGNOSIS — I5042 Chronic combined systolic (congestive) and diastolic (congestive) heart failure: Secondary | ICD-10-CM | POA: Diagnosis present

## 2019-05-25 DIAGNOSIS — R0602 Shortness of breath: Secondary | ICD-10-CM | POA: Diagnosis present

## 2019-05-25 DIAGNOSIS — J1289 Other viral pneumonia: Secondary | ICD-10-CM | POA: Diagnosis present

## 2019-05-25 DIAGNOSIS — Z9081 Acquired absence of spleen: Secondary | ICD-10-CM

## 2019-05-25 DIAGNOSIS — J9601 Acute respiratory failure with hypoxia: Secondary | ICD-10-CM | POA: Diagnosis present

## 2019-05-25 DIAGNOSIS — R197 Diarrhea, unspecified: Secondary | ICD-10-CM | POA: Diagnosis not present

## 2019-05-25 DIAGNOSIS — E875 Hyperkalemia: Secondary | ICD-10-CM

## 2019-05-25 DIAGNOSIS — E785 Hyperlipidemia, unspecified: Secondary | ICD-10-CM | POA: Diagnosis present

## 2019-05-25 DIAGNOSIS — F329 Major depressive disorder, single episode, unspecified: Secondary | ICD-10-CM | POA: Diagnosis present

## 2019-05-25 DIAGNOSIS — F419 Anxiety disorder, unspecified: Secondary | ICD-10-CM | POA: Diagnosis present

## 2019-05-25 DIAGNOSIS — R112 Nausea with vomiting, unspecified: Secondary | ICD-10-CM

## 2019-05-25 DIAGNOSIS — N179 Acute kidney failure, unspecified: Secondary | ICD-10-CM

## 2019-05-25 DIAGNOSIS — E861 Hypovolemia: Secondary | ICD-10-CM | POA: Diagnosis present

## 2019-05-25 DIAGNOSIS — I429 Cardiomyopathy, unspecified: Secondary | ICD-10-CM | POA: Diagnosis present

## 2019-05-25 LAB — BASIC METABOLIC PANEL
Anion gap: 13 (ref 5–15)
Anion gap: 10 (ref 5–15)
BUN: 66 mg/dL — ABNORMAL HIGH (ref 8–23)
BUN: 63 mg/dL — ABNORMAL HIGH (ref 8–23)
CO2: 21 mmol/L — ABNORMAL LOW (ref 22–32)
CO2: 21 mmol/L — ABNORMAL LOW (ref 22–32)
Calcium: 8.4 mg/dL — ABNORMAL LOW (ref 8.9–10.3)
Calcium: 7.9 mg/dL — ABNORMAL LOW (ref 8.9–10.3)
Chloride: 103 mmol/L (ref 98–111)
Chloride: 104 mmol/L (ref 98–111)
Creatinine, Ser: 3.41 mg/dL — ABNORMAL HIGH (ref 0.61–1.24)
Creatinine, Ser: 3.41 mg/dL — ABNORMAL HIGH (ref 0.61–1.24)
GFR calc Af Amer: 20 mL/min — ABNORMAL LOW (ref >60)
GFR calc Af Amer: 20 mL/min — ABNORMAL LOW (ref >60)
GFR calc non Af Amer: 17 mL/min — ABNORMAL LOW (ref >60)
GFR calc non Af Amer: 17 mL/min — ABNORMAL LOW (ref >60)
Glucose, Bld: 65 mg/dL — ABNORMAL LOW (ref 70–99)
Glucose, Bld: 253 mg/dL — ABNORMAL HIGH (ref 70–99)
Potassium: 6.4 mmol/L (ref 3.5–5.1)
Potassium: 6.5 mmol/L (ref 3.5–5.1)
Sodium: 137 mmol/L (ref 135–145)
Sodium: 135 mmol/L (ref 135–145)

## 2019-05-25 LAB — CBC WITH DIFFERENTIAL/PLATELET
Abs Immature Granulocytes: 0.12 10*3/uL — ABNORMAL HIGH (ref 0.00–0.07)
Basophils Absolute: 0 10*3/uL (ref 0.0–0.1)
Basophils Relative: 0 %
Eosinophils Absolute: 0.1 10*3/uL (ref 0.0–0.5)
Eosinophils Relative: 1 %
HCT: 50.6 % (ref 39.0–52.0)
Hemoglobin: 16.4 g/dL (ref 13.0–17.0)
Immature Granulocytes: 1 %
Lymphocytes Relative: 10 %
Lymphs Abs: 1.2 10*3/uL (ref 0.7–4.0)
MCH: 30 pg (ref 26.0–34.0)
MCHC: 32.4 g/dL (ref 30.0–36.0)
MCV: 92.5 fL (ref 80.0–100.0)
Monocytes Absolute: 1 10*3/uL (ref 0.1–1.0)
Monocytes Relative: 8 %
Neutro Abs: 9.9 10*3/uL — ABNORMAL HIGH (ref 1.7–7.7)
Neutrophils Relative %: 80 %
Platelets: 405 10*3/uL — ABNORMAL HIGH (ref 150–400)
RBC: 5.47 MIL/uL (ref 4.22–5.81)
RDW: 14.6 % (ref 11.5–15.5)
WBC: 12.4 10*3/uL — ABNORMAL HIGH (ref 4.0–10.5)
nRBC: 0 % (ref 0.0–0.2)

## 2019-05-25 LAB — URINALYSIS, ROUTINE W REFLEX MICROSCOPIC
Bilirubin Urine: NEGATIVE
Glucose, UA: NEGATIVE mg/dL
Hgb urine dipstick: NEGATIVE
Ketones, ur: NEGATIVE mg/dL
Leukocytes,Ua: NEGATIVE
Nitrite: NEGATIVE
Protein, ur: NEGATIVE mg/dL
Specific Gravity, Urine: 1.013 (ref 1.005–1.030)
pH: 5 (ref 5.0–8.0)

## 2019-05-25 LAB — COMPREHENSIVE METABOLIC PANEL
ALT: 22 U/L (ref 0–44)
AST: 24 U/L (ref 15–41)
Albumin: 3.4 g/dL — ABNORMAL LOW (ref 3.5–5.0)
Alkaline Phosphatase: 83 U/L (ref 38–126)
Anion gap: 15 (ref 5–15)
BUN: 69 mg/dL — ABNORMAL HIGH (ref 8–23)
CO2: 25 mmol/L (ref 22–32)
Calcium: 9.1 mg/dL (ref 8.9–10.3)
Chloride: 95 mmol/L — ABNORMAL LOW (ref 98–111)
Creatinine, Ser: 3.99 mg/dL — ABNORMAL HIGH (ref 0.61–1.24)
GFR calc Af Amer: 16 mL/min — ABNORMAL LOW (ref >60)
GFR calc non Af Amer: 14 mL/min — ABNORMAL LOW (ref >60)
Glucose, Bld: 123 mg/dL — ABNORMAL HIGH (ref 70–99)
Potassium: 5.7 mmol/L — ABNORMAL HIGH (ref 3.5–5.1)
Sodium: 135 mmol/L (ref 135–145)
Total Bilirubin: 0.8 mg/dL (ref 0.3–1.2)
Total Protein: 8.1 g/dL (ref 6.5–8.1)

## 2019-05-25 LAB — FERRITIN: Ferritin: 200 ng/mL (ref 24–336)

## 2019-05-25 LAB — C-REACTIVE PROTEIN: CRP: 12.7 mg/dL — ABNORMAL HIGH (ref <1.0)

## 2019-05-25 LAB — D-DIMER, QUANTITATIVE: D-Dimer, Quant: 5.1 ug/mL-FEU — ABNORMAL HIGH (ref 0.00–0.50)

## 2019-05-25 LAB — DIGOXIN LEVEL: Digoxin Level: <0.2 ng/mL — ABNORMAL LOW (ref 0.8–2.0)

## 2019-05-25 LAB — SARS CORONAVIRUS 2 BY RT PCR (HOSPITAL ORDER, PERFORMED IN ~~LOC~~ HOSPITAL LAB): SARS Coronavirus 2: POSITIVE — AB

## 2019-05-25 LAB — PROCALCITONIN: Procalcitonin: 0.16 ng/mL

## 2019-05-25 LAB — LIPASE, BLOOD: Lipase: 127 U/L — ABNORMAL HIGH (ref 11–51)

## 2019-05-25 LAB — LACTATE DEHYDROGENASE: LDH: 108 U/L (ref 98–192)

## 2019-05-25 LAB — BRAIN NATRIURETIC PEPTIDE: B Natriuretic Peptide: 33.4 pg/mL (ref 0.0–100.0)

## 2019-05-25 LAB — LACTIC ACID, PLASMA: Lactic Acid, Venous: 0.9 mmol/L (ref 0.5–1.9)

## 2019-05-25 LAB — TRIGLYCERIDES: Triglycerides: 75 mg/dL (ref <150)

## 2019-05-25 LAB — FIBRINOGEN: Fibrinogen: 784 mg/dL — ABNORMAL HIGH (ref 210–475)

## 2019-05-25 MED ORDER — ONDANSETRON HCL 4 MG/2ML IJ SOLN
4.0000 mg | Freq: Three times a day (TID) | INTRAMUSCULAR | Status: DC | PRN
  Administered 2019-05-26: 4 mg via INTRAVENOUS
  Filled 2019-05-25: qty 2

## 2019-05-25 MED ORDER — ONDANSETRON HCL 4 MG/2ML IJ SOLN
4.0000 mg | Freq: Once | INTRAMUSCULAR | Status: AC
  Administered 2019-05-25: 4 mg via INTRAVENOUS
  Filled 2019-05-25: qty 2

## 2019-05-25 MED ORDER — ACETAMINOPHEN 650 MG RE SUPP: 650.0000 mg | Freq: Four times a day (QID) | RECTAL | Status: DC | PRN

## 2019-05-25 MED ORDER — ZINC SULFATE 220 (50 ZN) MG PO CAPS
220.0000 mg | ORAL_CAPSULE | Freq: Every day | ORAL | Status: DC
  Administered 2019-05-25 – 2019-06-02 (×8): 220 mg via ORAL
  Filled 2019-05-25 (×8): qty 1

## 2019-05-25 MED ORDER — SODIUM CHLORIDE 0.9 % IV SOLN
200.0000 mg | Freq: Once | INTRAVENOUS | Status: AC
  Administered 2019-05-25: 200 mg via INTRAVENOUS
  Filled 2019-05-25: qty 40

## 2019-05-25 MED ORDER — CALCIUM GLUCONATE-NACL 1-0.675 GM/50ML-% IV SOLN
1.0000 g | Freq: Once | INTRAVENOUS | Status: AC
  Administered 2019-05-25: 22:00:00 1000 mg via INTRAVENOUS
  Filled 2019-05-25: qty 50

## 2019-05-25 MED ORDER — LACTATED RINGERS IV BOLUS: 500.0000 mL | Freq: Once | INTRAVENOUS | Status: DC

## 2019-05-25 MED ORDER — DEXTROSE 50 % IV SOLN
INTRAVENOUS | Status: AC
  Filled 2019-05-25: qty 50

## 2019-05-25 MED ORDER — DEXTROSE 50 % IV SOLN
INTRAVENOUS | Status: AC
  Administered 2019-05-25: 22:00:00
  Filled 2019-05-25: qty 50

## 2019-05-25 MED ORDER — SODIUM ZIRCONIUM CYCLOSILICATE 10 G PO PACK
10.0000 g | PACK | Freq: Every day | ORAL | Status: DC
  Administered 2019-05-25: 10 g via ORAL
  Filled 2019-05-25: qty 1

## 2019-05-25 MED ORDER — HEPARIN SODIUM (PORCINE) 5000 UNIT/ML IJ SOLN
7500.0000 [IU] | Freq: Three times a day (TID) | INTRAMUSCULAR | Status: DC
  Administered 2019-05-25 – 2019-05-27 (×5): 7500 [IU] via SUBCUTANEOUS
  Filled 2019-05-25 (×5): qty 2

## 2019-05-25 MED ORDER — POLYETHYLENE GLYCOL 3350 17 G PO PACK
17.0000 g | PACK | Freq: Every day | ORAL | Status: DC
  Administered 2019-05-27 – 2019-05-31 (×5): 17 g via ORAL
  Filled 2019-05-25 (×8): qty 1

## 2019-05-25 MED ORDER — CHLORHEXIDINE GLUCONATE CLOTH 2 % EX PADS
6.0000 | MEDICATED_PAD | Freq: Every day | CUTANEOUS | Status: DC
  Administered 2019-05-25 – 2019-06-03 (×10): 6 via TOPICAL

## 2019-05-25 MED ORDER — SODIUM CHLORIDE 0.9 % IV BOLUS
500.0000 mL | Freq: Once | INTRAVENOUS | Status: AC
  Administered 2019-05-25: 20:00:00 500 mL via INTRAVENOUS

## 2019-05-25 MED ORDER — SIMVASTATIN 20 MG PO TABS
20.0000 mg | ORAL_TABLET | Freq: Every day | ORAL | Status: DC
  Administered 2019-05-25 – 2019-06-02 (×8): 20 mg via ORAL
  Filled 2019-05-25 (×3): qty 1
  Filled 2019-05-25: qty 2
  Filled 2019-05-25 (×4): qty 1

## 2019-05-25 MED ORDER — DEXAMETHASONE SODIUM PHOSPHATE 10 MG/ML IJ SOLN: 6.0000 mg | INTRAMUSCULAR | Status: DC

## 2019-05-25 MED ORDER — INSULIN ASPART 100 UNIT/ML IV SOLN
5.0000 [IU] | Freq: Once | INTRAVENOUS | Status: AC
  Administered 2019-05-25: 5 [IU] via INTRAVENOUS

## 2019-05-25 MED ORDER — NICOTINE 7 MG/24HR TD PT24
7.0000 mg | MEDICATED_PATCH | Freq: Every day | TRANSDERMAL | Status: DC
  Administered 2019-05-25 – 2019-06-02 (×5): 7 mg via TRANSDERMAL
  Filled 2019-05-25 (×11): qty 1

## 2019-05-25 MED ORDER — SODIUM CHLORIDE 0.9 % IV BOLUS
500.0000 mL | Freq: Once | INTRAVENOUS | Status: AC
  Administered 2019-05-25: 11:00:00 500 mL via INTRAVENOUS

## 2019-05-25 MED ORDER — DEXAMETHASONE SODIUM PHOSPHATE 10 MG/ML IJ SOLN
6.0000 mg | Freq: Once | INTRAMUSCULAR | Status: AC
  Administered 2019-05-25: 6 mg via INTRAVENOUS
  Filled 2019-05-25: qty 1

## 2019-05-25 MED ORDER — ALPRAZOLAM 0.5 MG PO TABS
0.5000 mg | ORAL_TABLET | Freq: Two times a day (BID) | ORAL | Status: DC | PRN
  Administered 2019-05-25 – 2019-05-29 (×3): 0.5 mg via ORAL
  Filled 2019-05-25 (×3): qty 1

## 2019-05-25 MED ORDER — LACTATED RINGERS IV BOLUS: 1000.0000 mL | Freq: Once | INTRAVENOUS | Status: DC

## 2019-05-25 MED ORDER — HYDROCOD POLST-CPM POLST ER 10-8 MG/5ML PO SUER
5.0000 mL | Freq: Two times a day (BID) | ORAL | Status: DC | PRN
  Administered 2019-05-27: 5 mL via ORAL
  Filled 2019-05-25: qty 5

## 2019-05-25 MED ORDER — SODIUM CHLORIDE 0.9 % IV BOLUS: 1000.0000 mL | Freq: Once | INTRAVENOUS | Status: DC

## 2019-05-25 MED ORDER — FAMOTIDINE 20 MG PO TABS
20.0000 mg | ORAL_TABLET | Freq: Two times a day (BID) | ORAL | Status: DC
  Administered 2019-05-25: 20 mg via ORAL
  Filled 2019-05-25 (×2): qty 1

## 2019-05-25 MED ORDER — NICOTINE POLACRILEX 2 MG MT GUM
2.0000 mg | CHEWING_GUM | OROMUCOSAL | Status: DC | PRN
  Filled 2019-05-25: qty 1

## 2019-05-25 MED ORDER — SODIUM CHLORIDE 0.9 % IV BOLUS
500.0000 mL | Freq: Once | INTRAVENOUS | Status: AC
  Administered 2019-05-25: 22:00:00 500 mL via INTRAVENOUS

## 2019-05-25 MED ORDER — OXYCODONE HCL 5 MG PO TABS
5.0000 mg | ORAL_TABLET | ORAL | Status: DC | PRN
  Administered 2019-05-29: 5 mg via ORAL
  Filled 2019-05-25: qty 1

## 2019-05-25 MED ORDER — ACETAMINOPHEN 325 MG PO TABS: 650.0000 mg | ORAL_TABLET | Freq: Four times a day (QID) | ORAL | Status: DC | PRN

## 2019-05-25 MED ORDER — GUAIFENESIN-DM 100-10 MG/5ML PO SYRP
10.0000 mL | ORAL_SOLUTION | ORAL | Status: DC | PRN
  Administered 2019-05-27 – 2019-06-02 (×2): 10 mL via ORAL
  Filled 2019-05-25 (×2): qty 10

## 2019-05-25 MED ORDER — CARVEDILOL 12.5 MG PO TABS
12.5000 mg | ORAL_TABLET | Freq: Two times a day (BID) | ORAL | Status: DC
  Administered 2019-05-25: 22:00:00 12.5 mg via ORAL
  Filled 2019-05-25: qty 1

## 2019-05-25 MED ORDER — DEXTROSE 50 % IV SOLN: 1.0000 | Freq: Once | INTRAVENOUS | Status: AC

## 2019-05-25 MED ORDER — FLUOXETINE HCL 20 MG PO CAPS
20.0000 mg | ORAL_CAPSULE | Freq: Every day | ORAL | Status: DC
  Administered 2019-05-25 – 2019-06-02 (×8): 20 mg via ORAL
  Filled 2019-05-25 (×10): qty 1

## 2019-05-25 MED ORDER — VITAMIN C 500 MG PO TABS
500.0000 mg | ORAL_TABLET | Freq: Every day | ORAL | Status: DC
  Administered 2019-05-25 – 2019-06-02 (×7): 500 mg via ORAL
  Filled 2019-05-25 (×8): qty 1

## 2019-05-25 MED ORDER — SODIUM CHLORIDE 0.9 % IV SOLN
100.0000 mg | INTRAVENOUS | Status: AC
  Administered 2019-05-26 – 2019-05-29 (×4): 100 mg via INTRAVENOUS
  Filled 2019-05-25 (×5): qty 20

## 2019-05-25 MED ORDER — INSULIN ASPART 100 UNIT/ML IV SOLN
10.0000 [IU] | Freq: Once | INTRAVENOUS | Status: AC
  Administered 2019-05-25: 10 [IU] via INTRAVENOUS

## 2019-05-25 MED ORDER — SODIUM CHLORIDE 0.9 % IV SOLN
INTRAVENOUS | Status: AC
  Administered 2019-05-25: 16:00:00 via INTRAVENOUS

## 2019-05-25 MED ORDER — DEXTROSE 50 % IV SOLN
1.0000 | Freq: Once | INTRAVENOUS | Status: AC
  Administered 2019-05-25: 50 mL via INTRAVENOUS

## 2019-05-25 MED ORDER — SODIUM CHLORIDE 0.9 % IV BOLUS
500.0000 mL | Freq: Once | INTRAVENOUS | Status: AC
  Administered 2019-05-25: 500 mL via INTRAVENOUS

## 2019-05-25 NOTE — ED Notes (Signed)
Administered Zofran per MD order. Patient alert and talking. O2 95% on 4L [place]. Patient is maintaining airway at this time.

## 2019-05-25 NOTE — ED Provider Notes (Signed)
[facility] DEPT Provider Note   CSN: [id number] Arrival date & time: 05/25/19  0935     History   Chief Complaint Chief Complaint  Patient presents with   Shortness of Breath   Weakness   Emesis    HPI Kristopher [name] is a 73 y.o. male.  He is brought in by EMS from home for evaluation of generalized weakness.  He said he has been sick for about 5 days with nausea vomiting diarrhea decreased appetite upper abdominal pain cough fevers and chills.  He still smokes but he said only 1 cigarette a day now.  He has been trying Ensure but is been getting progressively weaker.  His cough is been nonproductive.  He has not noticed any blood from above or below.  His abdominal pain is upper and moderate in severity aching in nature.  EMS found him with a sat of 88% and placed him on a nonrebreather.  He does not usually require oxygen.  No sick contacts or recent travel.     The history is provided by the patient.  GI Problem This is a new problem. The current episode started more than 2 days ago. The problem occurs daily. The problem has not changed since onset.Associated symptoms include abdominal pain and shortness of breath. Pertinent negatives include no chest pain and no headaches. Nothing aggravates the symptoms. Nothing relieves the symptoms. He has tried nothing for the symptoms. The treatment provided no relief.    Past Medical History:  Diagnosis Date   Anxiety    takes Xanax prn   Cardiomyopathy    takes Digoxin daily   Depression    takes Prozac daily   Dyslipidemia    takes Simvastatin daily   HTN (hypertension)    takes Carvedilol and Lisinopril daily   Impaired hearing    left    Patient Active Problem List   Diagnosis Date Noted   PNA (pneumonia) 08/20/2012   Nausea & vomiting 08/20/2012   Grief at loss of child 08/20/2012   Healthcare maintenance 11/29/2011   Anorexia 04/17/2011   TOBACCO ABUSE 11/23/2009    Dyslipidemia 05/01/2009   Essential hypertension 05/01/2009   CARDIOMYOPATHY 05/01/2009    Past Surgical History:  Procedure Laterality Date   CHOLECYSTECTOMY     > 80yrs ago   COLONOSCOPY     HERNIA REPAIR     kidney removed     but unsure of which one;>28yrs ago   MASS EXCISION Left 06/14/2013   Procedure: EXCISION of left chest wall MASS;  Surgeon: Madilyn Hook, DO;  Location: [facility];  Service: General;  Laterality: Left;   SPLENECTOMY     >67yrs ago        Home Medications    Prior to Admission medications   Medication Sig Start Date End Date Taking? Authorizing Provider  ALPRAZolam Duanne Moron) 0.5 MG tablet Take 1 tablet (0.5 mg total) by mouth 2 (two) times daily as needed for anxiety. 11/20/16   [name], MD  carvedilol (COREG) 25 MG tablet TAKE 1 TABLET BY MOUTH TWICE A DAY WITH MEALS 04/12/19   [name], MD  FLUoxetine (PROZAC) 20 MG capsule Take 1 capsule (20 mg total) by mouth daily. Please request next refill from PCP. Thank you 02/12/18   [name], MD  furosemide (LASIX) 40 MG tablet Take 1 tablet (40 mg total) by mouth daily. 04/12/19   [name], MD  HYDROcodone-acetaminophen (NORCO) 5-325 MG per tablet  Take 1 tablet by mouth every 4 (four) hours as needed for pain. 06/14/13   Madilyn Hook, DO  lisinopril (PRINIVIL,ZESTRIL) 20 MG tablet TAKE 1 TABLET BY MOUTH ONCE A DAY 10/24/16   [name], MD  potassium chloride SA (K-DUR) 20 MEQ tablet TAKE 1 TABLET BY MOUTH ONCE A DAY 04/12/19   [name], MD  predniSONE (DELTASONE) 50 MG tablet 1 tablet PO daily 04/12/14   Rancour, Annie Main, MD  simvastatin (ZOCOR) 20 MG tablet TAKE ONE TABLET BY MOUTH EVERY NIGHT AT BEDTIME 04/12/19   [name], MD    Family History Family History  Problem Relation Age of Onset   Cancer Mother    Heart attack Sister    Heart attack Brother    Stroke Neg Hx     Social History Social History   Tobacco Use   Smoking status: Current Some Day Smoker     Packs/day: 1.00    Years: 50.00    Pack years: 50.00    Types: Cigarettes   Smokeless tobacco: Never Used   Tobacco comment: positive for tobacco abuse   Substance Use Topics   Alcohol use: No   Drug use: No     Allergies   Patient has no known allergies.   Review of Systems Review of Systems  Constitutional: Positive for appetite change, chills, fatigue and fever.  HENT: Negative for sore throat.   Eyes: Negative for visual disturbance.  Respiratory: Positive for cough and shortness of breath.   Cardiovascular: Negative for chest pain.  Gastrointestinal: Positive for abdominal pain, diarrhea, nausea and vomiting.  Genitourinary: Negative for dysuria.  Musculoskeletal: Positive for myalgias.  Skin: Negative for rash.  Neurological: Negative for headaches.     Physical Exam Updated Vital Signs BP (!) 80/53    Pulse 68    Temp (!) 97.4 F (36.3 C) (Oral)    Resp (!) 26    Wt 63.5 kg    SpO2 96%    BMI 21.93 kg/m   Physical Exam Vitals signs and nursing note reviewed.  Constitutional:      Appearance: He is well-developed.  HENT:     Head: Normocephalic and atraumatic.  Eyes:     Conjunctiva/sclera: Conjunctivae normal.  Neck:     Musculoskeletal: Neck supple.  Cardiovascular:     Rate and Rhythm: Normal rate and regular rhythm.     Heart sounds: No murmur.  Pulmonary:     Effort: Tachypnea and accessory muscle usage present. No respiratory distress.     Breath sounds: No stridor. Rhonchi (scattered) present. No wheezing.  Abdominal:     Palpations: Abdomen is soft.     Tenderness: There is abdominal tenderness. There is no guarding or rebound.     Comments: He has multiple well-healed scars over his abdomen.  He is got some tenderness epigastric although no masses guarding or rebound.  Musculoskeletal: Normal range of motion.     Right lower leg: No edema.     Left lower leg: No edema.  Skin:    General: Skin is warm and dry.     Capillary Refill:  Capillary refill takes less than 2 seconds.  Neurological:     General: No focal deficit present.     Mental Status: He is alert and oriented to person, place, and time.      ED Treatments / Results  Labs (all labs ordered are listed, but only abnormal results are displayed) Labs Reviewed  SARS CORONAVIRUS  2 (HOSPITAL ORDER, [facility]) - Abnormal; Notable for the following components:      Result Value   SARS Coronavirus 2 POSITIVE (*)    All other components within normal limits  CBC WITH DIFFERENTIAL/PLATELET - Abnormal; Notable for the following components:   WBC 12.4 (*)    Platelets 405 (*)    Neutro Abs 9.9 (*)    Abs Immature Granulocytes 0.12 (*)    All other components within normal limits  COMPREHENSIVE METABOLIC PANEL - Abnormal; Notable for the following components:   Potassium 5.7 (*)    Chloride 95 (*)    Glucose, Bld 123 (*)    BUN 69 (*)    Creatinine, Ser 3.99 (*)    Albumin 3.4 (*)    GFR calc non Af Amer 14 (*)    GFR calc Af Amer 16 (*)    All other components within normal limits  D-DIMER, QUANTITATIVE (NOT AT [facility]) - Abnormal; Notable for the following components:   D-Dimer, Quant 5.10 (*)    All other components within normal limits  FIBRINOGEN - Abnormal; Notable for the following components:   Fibrinogen 784 (*)    All other components within normal limits  C-REACTIVE PROTEIN - Abnormal; Notable for the following components:   CRP 12.7 (*)    All other components within normal limits  DIGOXIN LEVEL - Abnormal; Notable for the following components:   Digoxin Level <0.2 (*)    All other components within normal limits  LIPASE, BLOOD - Abnormal; Notable for the following components:   Lipase 127 (*)    All other components within normal limits  CULTURE, BLOOD (ROUTINE X 2)  CULTURE, BLOOD (ROUTINE X 2)  GASTROINTESTINAL PANEL BY PCR, STOOL (REPLACES STOOL CULTURE)  URINE CULTURE  LACTIC ACID, PLASMA  PROCALCITONIN    LACTATE DEHYDROGENASE  FERRITIN  TRIGLYCERIDES  BRAIN NATRIURETIC PEPTIDE  URINALYSIS, ROUTINE W REFLEX MICROSCOPIC  BASIC METABOLIC PANEL  ABO/RH    EKG EKG Interpretation  Date/Time:  Wednesday May 25 2019 10:08:14 EDT Ventricular Rate:  77 PR Interval:    QRS Duration: 155 QT Interval:  411 QTC Calculation: 466 R Axis:   5 Text Interpretation:  Sinus rhythm Left bundle branch block similar to prior 7/15 Confirmed by Aletta Edouard [phone]) on 05/25/2019 10:22:00 AM Also confirmed by Aletta Edouard [phone]), editor Philomena Doheny [phone])  on 05/25/2019 10:34:33 AM   Radiology Dg Chest Port 1 View  Result Date: 05/25/2019 CLINICAL DATA:  Increased shortness of breath EXAM: PORTABLE CHEST 1 VIEW COMPARISON:  04/11/2019 FINDINGS: Cardiac shadow is stable. Aortic calcifications are noted. Increased interstitial markings are seen particularly on the left likely related to underlying edema. No sizable effusion is seen. No bony abnormality is noted. IMPRESSION: Increasing interstitial changes likely related to underlying edema. Electronically Signed   By: Inez Catalina M.D.   On: 05/25/2019 10:46    Procedures .Critical Care Performed by: Hayden Rasmussen, MD Authorized by: Hayden Rasmussen, MD   Critical care provider statement:    Critical care time (minutes):  45   Critical care time was exclusive of:  Separately billable procedures and treating other patients   Critical care was necessary to treat or prevent imminent or life-threatening deterioration of the following conditions:  Dehydration and metabolic crisis   Critical care was time spent personally by me on the following activities:  Discussions with consultants, evaluation of patient's response to treatment, examination of patient, ordering and  performing treatments and interventions, ordering and review of laboratory studies, ordering and review of radiographic studies, pulse oximetry, re-evaluation of patient's  condition, obtaining history from patient or surrogate, review of old charts and development of treatment plan with patient or surrogate   I assumed direction of critical care for this patient from another provider in my specialty: no     (including critical care time)  Medications Ordered in ED Medications  dexamethasone (DECADRON) injection 6 mg (has no administration in time range)  sodium chloride 0.9 % bolus 500 mL (has no administration in time range)  sodium chloride 0.9 % bolus 500 mL (500 mLs Intravenous New Bag/Given (Non-Interop) 05/25/19 1100)  ondansetron (ZOFRAN) injection 4 mg (4 mg Intravenous Given 05/25/19 1242)     Initial Impression / Assessment and Plan / ED Course  I have reviewed the triage vital signs and the nursing notes.  Pertinent labs & imaging results that were available during my care of the patient were reviewed by me and considered in my medical decision making (see chart for details).  Clinical Course as of May 24 1317  Wed May 25, 2631  [id number] 73 year old male here with fevers chills vomiting diarrhea cough shortness of breath abdominal pain.  He is requiring oxygen his blood pressure soft.  Tachypneic.  Giving supplemental oxygen IV fluids and chest x-ray EKG lab work.   [MB]  1050 Chest x-ray showing some cephalization possible edema.  Awaiting radiology reading.   [MB]  1137 Patient's labs coming back now and showing an elevated white blood count of 12 along with a new AKI with a creatinine of 3.99.  Potassium elevated at 5.7.  EKG showing a bundle baseline.  His chest x-ray the read as may be failure but he looks clinically very dry and that supported with his renal function.  He is getting some fluids now but his blood pressures remain soft.  I see an old echo that she had his EF at 40% but nothing more recent.  Not sure how much fluid he can tolerate.   [MB]  1150 Patient's BNP is 33 so we will continue to fluid resuscitate as his pressures in the 80s  although his lactate is normal.  With a new elevated creatinine I asked if the nurse could BladderScan him to make sure he does not have any signs of retention otherwise he would need a catheter.   [MB]  1216 Patient clinically looks better although his blood pressure still remains around 80.  He says he does not feel like he needs to urinate.  We tried to bladder scan him but did not get any answer some pressure is probably nothing in his bladder.  Inflammatory markers elevated.  Covid testing pending.   [MB]  1218 Patient asking to drink and eat.   [MB]  1311 Patient had a coughing spell with drinking so we will stop that for now.  His Covid test is positive.  I discussed with Dr. Florene Glen from the hospitalist service who will evaluate him for admission likely to [facility].   [MB]    Clinical Course User Index [MB] Hayden Rasmussen, MD   Kristopher [name] was evaluated in Emergency Department on 05/25/2019 for the symptoms described in the history of present illness. He was evaluated in the context of the global COVID-19 pandemic, which necessitated consideration that the patient might be at risk for infection with the SARS-CoV-2 virus that causes COVID-19. Institutional protocols and algorithms that pertain to  the evaluation of patients at risk for COVID-19 are in a state of rapid change based on information released by regulatory bodies including the CDC and federal and state organizations. These policies and algorithms were followed during the patient's care in the ED.      Final Clinical Impressions(s) / ED Diagnoses   Final diagnoses:  COVID-19 virus infection  AKI (acute kidney injury) ([place])  Nausea vomiting and diarrhea  Hyperkalemia  Weakness  Shortness of breath    ED Discharge Orders    None       Hayden Rasmussen, MD 05/25/19 1652

## 2019-05-25 NOTE — ED Notes (Signed)
MD made aware of trending BP. 

## 2019-05-25 NOTE — ED Notes (Addendum)
Patient provided with water with MD permission. After sipping water, patient started coughing. Initially O2 remained at 96% on 4L [place], then dropped to 89% on 4L [employer]. MD called to bedside. Patient produced small amount of thick white sputum into emesis bag.

## 2019-05-25 NOTE — Progress Notes (Addendum)
Potassium 6.4. EKG obtained, 5 units insulin given, 1 am dextrose 50 given, labs redrawn. Will continue to monitor.   In and out cath pt 700cc's out

## 2019-05-25 NOTE — ED Triage Notes (Signed)
Per EMS, Pt, from home, c/o SOB, weakness, body aches, and n/v x 1 week.  Pain score 8/10.  Pt called PCP yesterday and prescribed an antibiotic.  Sts he felt worse after taking antibiotic.  Pt lives alone and is typically active.   Pt was 88% RA.

## 2019-05-25 NOTE — Progress Notes (Signed)
Pharmacy: Remdesivir   Patient is a 73 y.o. M with COVID.  Pharmacy has been consulted for remdesivir dosing.   - ALT: 22 - CXR shows: Increasing interstitial changes likely related to underlying edema - Pt is requiring supplemental oxygen: (NRB @ 8L)   A/P:  - Patient meets criteria for remdesivir. Will initiate remdesivir 200 mg once followed by 100 mg daily x 4 days.  - Daily CMET while on remdesivir - Will f/u pt's ALT and clinical condition  Netta Cedars, PharmD, BCPS 05/25/2019@2 :33 PM

## 2019-05-25 NOTE — ED Notes (Signed)
Date and time results received: 05/25/19 1:08 PM  (use smartphrase ".now" to insert current time)  Test: COVID Critical Value: POSITIVE  Name of Provider Notified: Dr. Melina Copa  Orders Received? Or Actions Taken?:

## 2019-05-25 NOTE — ED Notes (Signed)
MD made aware bladder scanner is not working at this time.

## 2019-05-25 NOTE — H&P (Addendum)
History and Physical    Kristopher [name] [id number] DOB: 1946-05-29 DOA: 05/25/2019  PCP: Alvester Chou, NP  Patient coming from: home  I have personally briefly reviewed patient's old medical records in [place]  Chief Complaint: nausea, vomiting, diarrhea  HPI: Kristopher [name] is a 73 y.o. 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  Sx started about 1 week ago.  He notes nausea, vomiting, diarrhea.  Vomiting is nonbloody.  No blood in stool.  He's noticed subjective fevers and chills.  He denies sick contacts or recent travel.  He smokes, but recently has been smoking less.  He notes he's been progressively getting weaker.  EMS found him with sats of 88% and placed him on NRB.  He denies CP, headache, lower extremity edema.   ED Course: Labs, imaging, EKG.  Admit for COVID 19 infection with AKI.  Review of Systems: As per HPI otherwise 10 point review of systems negative.   Past Medical History:  Diagnosis Date  . Anxiety    takes Xanax prn  . Cardiomyopathy    takes Digoxin daily  . Depression    takes Prozac daily  . Dyslipidemia    takes Simvastatin daily  . HTN (hypertension)    takes Carvedilol and Lisinopril daily  . Impaired hearing    left    Past Surgical History:  Procedure Laterality Date  . CHOLECYSTECTOMY     > 48yrs ago  . COLONOSCOPY    . HERNIA REPAIR    . kidney removed     but unsure of which one;>96yrs ago  . MASS EXCISION Left 06/14/2013   Procedure: EXCISION of left chest wall MASS;  Surgeon: Madilyn Hook, DO;  Location: [facility];  Service: General;  Laterality: Left;  . SPLENECTOMY     >69yrs ago     reports that he has been smoking cigarettes. He has a 50.00 pack-year smoking history. He has never used smokeless tobacco. He reports  that he does not drink alcohol or use drugs.  No Known Allergies  Family History  Problem Relation Age of Onset  . Cancer Mother   . Heart attack Sister   . Heart attack Brother   . Stroke Neg Hx    Prior to Admission medications   Medication Sig Start Date End Date Taking? Authorizing Provider  ALPRAZolam Duanne Moron) 0.5 MG tablet Take 1 tablet (0.5 mg total) by mouth 2 (two) times daily as needed for anxiety. 11/20/16   [name], MD  amoxicillin (AMOXIL) 500 MG capsule Take 500 mg by mouth 2 (two) times daily. Take 500mg  by mouth twice daily for 10 days.    [provider]  carisoprodol (SOMA) 350 MG tablet Take 350 mg by mouth at bedtime as needed for muscle spasms.    [provider]  carvedilol (COREG) 25 MG tablet TAKE 1 TABLET BY MOUTH TWICE A DAY WITH MEALS Patient taking differently: Take 25 mg by mouth 2 (two) times daily with a meal.  04/12/19   [name], MD  cetirizine (ZYRTEC) 10 MG tablet Take 10 mg by mouth daily.    [provider]  FLUoxetine (PROZAC) 20 MG capsule Take 1 capsule (20 mg total) by mouth daily. Please request next refill from PCP. Thank you 02/12/18   [name], MD  furosemide (LASIX) 40 MG tablet Take 1 tablet (40 mg total) by mouth daily. 04/12/19   [name], MD  lisinopril (PRINIVIL,ZESTRIL) 20 MG tablet TAKE 1 TABLET BY MOUTH ONCE A DAY Patient taking differently: Take 20 mg by mouth daily.  10/24/16   [name], MD  ondansetron (ZOFRAN) 8 MG tablet Take 8 mg by mouth every 8 (eight) hours as needed for nausea or vomiting.    [provider]  potassium chloride SA (K-DUR) 20 MEQ tablet TAKE 1 TABLET BY MOUTH ONCE A DAY Patient taking differently: Take 20 mEq by mouth daily.  04/12/19   [name], MD  simvastatin (ZOCOR) 20 MG tablet TAKE ONE TABLET BY MOUTH EVERY NIGHT AT BEDTIME Patient taking differently: Take 20 mg by mouth at bedtime.  04/12/19   [name], MD    Physical Exam: Vitals:    05/25/19 1600 05/25/19 1700 05/25/19 1800 05/25/19 1849  BP: (!) 82/49 (!) 114/45 127/62   Pulse: 64 (!) 57 72 77  Resp: (!) 26 (!) 28 (!) 28 (!) 24  Temp:      TempSrc:      SpO2: 98% 95% (!) 89% 94%  Weight:        Constitutional: NAD, calm, comfortable Vitals:   05/25/19 1600 05/25/19 1700 05/25/19 1800 05/25/19 1849  BP: (!) 82/49 (!) 114/45 127/62   Pulse: 64 (!) 57 72 77  Resp: (!) 26 (!) 28 (!) 28 (!) 24  Temp:      TempSrc:      SpO2: 98% 95% (!) 89% 94%  Weight:       Eyes: PERRL, lids and conjunctivae normal ENMT: Mucous membranes are moist. Posterior pharynx clear of any exudate or lesions.Normal dentition.  Neck: normal, supple, no masses, no thyromegaly Respiratory: slightly increased WOB, on 4 L [place], frequent cough Cardiovascular: Regular rate and rhythm. No extremity edema. 2+ pedal pulses.  Abdomen: no tenderness, no masses palpated. No hepatosplenomegaly. Bowel sounds positive.  Musculoskeletal: no clubbing / cyanosis. No joint deformity upper and lower extremities. Good ROM, no contractures. Normal muscle tone.  Skin: no rashes, lesions, ulcers. No induration Neurologic: CN 2-12 grossly intact. Sensation intact.  No LEE.  Psychiatric: Normal judgment and insight. Alert and oriented x 3. Normal mood.   Labs on Admission: I have personally reviewed following labs and imaging studies  CBC: Recent Labs  Lab 05/25/19 1022  WBC 12.4*  NEUTROABS 9.9*  HGB 16.4  HCT 50.6  MCV 92.5  PLT 123456*   Basic Metabolic Panel: Recent Labs  Lab 05/25/19 1022  NA 135  K 5.7*  CL 95*  CO2 25  GLUCOSE 123*  BUN 69*  CREATININE 3.99*  CALCIUM 9.1   GFR: CrCl cannot be calculated (Unknown ideal weight.). Liver Function Tests: Recent Labs  Lab 05/25/19 1022  AST 24  ALT 22  ALKPHOS 83  BILITOT 0.8  PROT 8.1  ALBUMIN 3.4*   Recent Labs  Lab 05/25/19 1022  LIPASE 127*   No results for input(s): AMMONIA in the last 168 hours. Coagulation Profile: No  results for input(s): INR, PROTIME in the last 168 hours. Cardiac Enzymes: No results for input(s): CKTOTAL, CKMB, CKMBINDEX, TROPONINI in the last 168 hours. BNP (last 3 results) No results for input(s): PROBNP in the last 8760 hours. HbA1C: No results for input(s): HGBA1C in the last 72 hours. CBG: No results for input(s): GLUCAP in the last 168 hours. Lipid Profile: Recent Labs  05/25/19 1022  TRIG 75   Thyroid Function Tests: No results for input(s): TSH, T4TOTAL, FREET4, T3FREE, THYROIDAB in the last 72 hours. Anemia Panel: Recent Labs    05/25/19 1022  FERRITIN 200   Urine analysis:    Component Value Date/Time   COLORURINE YELLOW 10/18/2012 [place] 10/18/2012 0733   LABSPEC 1.005 10/18/2012 0733   PHURINE 7.5 10/18/2012 0733   GLUCOSEU NEGATIVE 10/18/2012 0733   HGBUR NEGATIVE 10/18/2012 0733   BILIRUBINUR NEGATIVE 10/18/2012 0733   KETONESUR NEGATIVE 10/18/2012 0733   PROTEINUR NEGATIVE 10/18/2012 0733   UROBILINOGEN 0.2 10/18/2012 0733   NITRITE NEGATIVE 10/18/2012 0733   LEUKOCYTESUR TRACE (A) 10/18/2012 0733    Radiological Exams on Admission: Dg Chest Port 1 View  Result Date: 05/25/2019 CLINICAL DATA:  Increased shortness of breath EXAM: PORTABLE CHEST 1 VIEW COMPARISON:  04/11/2019 FINDINGS: Cardiac shadow is stable. Aortic calcifications are noted. Increased interstitial markings are seen particularly on the left likely related to underlying edema. No sizable effusion is seen. No bony abnormality is noted. IMPRESSION: Increasing interstitial changes likely related to underlying edema. Electronically Signed   By: Inez Catalina M.D.   On: 05/25/2019 10:46    EKG: Independently reviewed. Sinus.  LBBB.  Similar to priors.  Assessment/Plan Active Problems:   COVID-19 virus infection   Acute Hypoxic Respiratory Failure 2/2 COVID 19 Infection:  Pt with GI symptoms as well as SOB CXR with interstitial changes Initially on NRB, but  weaned down to 4 L [place] Started on remdesivir (discussed with pharmacy given AKI) Start on dexamethasone Consider adding actemra  High dose heparin due to elevated d dimer Elevated inflammatory markers, follow daily Prone as tolerated I/O, daily weights, IVF as noted below, goal euvolemic  Nausea  Vomiting  Diarrhea: likely 2/2 above.  Elevated lipase noted as well.  Follow CT abdomen/pelvis.  Acute Kidney Injury  Hyperkalemia  Hx L Nephrectomy: due to N/V/D resulting in hypovolemia, also on lasix and lisinopril.   Creatinine improved with IVF, will follow CT scan abd/pelvis to r/o hydro Urinalysis pending collection -> I/O cath needed (follow output) Worsening K this PM.  Repeat potassium, follow EKG, give insulin and dextrose, start lokelma (ordered daily for now, follow K closely).  Follow closely. Continue telemetry I/O, daily weights  Concern for aspiration: in ED, pt had water and difficulty with this, concern for aspiration, coughing after sipping water.  NPO for now.  Discussed with nurse, ok with bedside eval on floor prior to meds.  SLP eval ordered.  HFrEF: last EF 40% in 2014.  Caution with IVF.  Currently appears hypovolemic.  Elevated Lipase  Possible Pancreatitis: o sig abdominal pain on my exam.  Follow CT abd/pelvis.  Continue IVF.  Caution given hx HF and COVID 19 infection.  Hypertension: pt with borderline BP's, suspect due to N/V/D and hypovolemia.  Continue coreg at lower dose with holding parameters.  Hold lisinopril and lasix with his AKI.  Hx Splenectomy: noted, follow, at risk infections.  Will need to ensure he's up to date with vaccinations.  Anxiety: continue xanax prn, continue prozac  HLD: continue simvastatin  Tobacco abuse: sounds like he's trying to cut back, continue to encourage cessation.  Nicotine patch and gum.  DVT prophylaxis: heparin, high dose  Code Status: full Family Communication: none at bedside, will call son - discussed with son  [phone])  Disposition Plan: pending further improvement  Consults called: none  Admission status: inpatent  - 50 min  critical care time with AHRF with COVID 19 infection, AKI, hyperkalemia.     Fayrene Helper MD [facility] Pager AMION  If 7PM-7AM, please contact night-coverage [email or web address] Password [facility]  05/25/2019, 6:51 PM

## 2019-05-25 NOTE — Progress Notes (Signed)
CRITICAL VALUE ALERT  Critical Value:  K 6.5  Date & Time Notied:  05/25/19 2055  Provider Notified: Tylene Fantasia, NP notified at 2057  Orders Received/Actions taken: Awaiting orders. Critical value also reported to primary RN Jeanie Cooks.

## 2019-05-25 NOTE — ED Notes (Signed)
Patient incontinent of stool. Peri care and linen change complete.

## 2019-05-25 NOTE — ED Notes (Signed)
Hospitalist at bedside 

## 2019-05-25 NOTE — Progress Notes (Signed)
CRITICAL VALUE ALERT  Critical Value:  K 6.4  Date & Time Notied: 05/25/2019 1853  Provider Notified:Dr. C. Powell   Orders Received/Actions taken: Awaiting call back for new orders

## 2019-05-25 NOTE — Progress Notes (Addendum)
K+- 6.5. 10 units insulin given, 500 cc bolus, 1 amp dextrose 50, and 1 gram calcium carbonate.  Will continue to monitor. Repeat EKG given to night MD

## 2019-05-25 NOTE — ED Notes (Signed)
ED TO INPATIENT HANDOFF REPORT  Name/Age/Gender Kristopher [name] 73 y.o. male  Code Status Code Status History    Date Active Date Inactive Code Status Order ID Comments User Context   08/20/2012 0550 08/22/2012 1322 Full Code [id number]  Darci Current, RN Inpatient   Advance Care Planning Activity      Home/SNF/Other Home  Chief Complaint short of breath  Level of Care/Admitting Diagnosis ED Disposition    ED Disposition Condition [place]: [facility] [100102]  Level of Care: Stepdown [14]  Admit to SDU based on following criteria: Respiratory Distress:  Frequent assessment and/or intervention to maintain adequate ventilation/respiration, pulmonary toilet, and respiratory treatment.  Admit to SDU based on following criteria: Severe physiological/psychological symptoms:  Any diagnosis requiring assessment & intervention at least every 4 hours on an ongoing basis to obtain desired patient outcomes including stability and rehabilitation  Covid Evaluation: Confirmed COVID Positive  Diagnosis: COVID-19 virus infection [id number]  Admitting Physician: Elodia Florence [phone]  Attending Physician: Cephus Slater, A CALDWELL [phone]  Estimated length of stay: past midnight tomorrow  Certification:: I certify this patient will need inpatient services for at least 2 midnights  PT Class (Do Not Modify): Inpatient [101]  PT Acc Code (Do Not Modify): Private [1]       Medical History Past Medical History:  Diagnosis Date  . Anxiety    takes Xanax prn  . Cardiomyopathy    takes Digoxin daily  . Depression    takes Prozac daily  . Dyslipidemia    takes Simvastatin daily  . HTN (hypertension)    takes Carvedilol and Lisinopril daily  . Impaired hearing    left    Allergies No Known Allergies  IV Location/Drains/Wounds Patient Lines/Drains/Airways Status   Active Line/Drains/Airways    Name:   Placement date:   Placement  time:   Site:   Days:   Peripheral IV 05/25/19 Anterior;Right Forearm   05/25/19    -    Forearm   less than 1   Peripheral IV 05/25/19 Left Antecubital   05/25/19    1059    Antecubital   less than 1   Peripheral IV 05/25/19 Left Forearm   05/25/19    1101    Forearm   less than 1          Labs/Imaging Results for orders placed or performed during the hospital encounter of 05/25/19 (from the past 48 hour(s))  Lactic acid, plasma     Status: None   Collection Time: 05/25/19  9:51 AM  Result Value Ref Range   Lactic Acid, Venous 0.9 0.5 - 1.9 mmol/L    Comment: Performed at [facility], [place] [address], [place], [place] [address]  CBC WITH DIFFERENTIAL     Status: Abnormal   Collection Time: 05/25/19 10:22 AM  Result Value Ref Range   WBC 12.4 (H) 4.0 - 10.5 K/uL   RBC 5.47 4.22 - 5.81 MIL/uL   Hemoglobin 16.4 13.0 - 17.0 g/dL   HCT 50.6 39.0 - 52.0 %   MCV 92.5 80.0 - 100.0 fL   MCH 30.0 26.0 - 34.0 pg   MCHC 32.4 30.0 - 36.0 g/dL   RDW 14.6 11.5 - 15.5 %   Platelets 405 (H) 150 - 400 K/uL   nRBC 0.0 0.0 - 0.2 %   Neutrophils Relative % 80 %   Neutro Abs 9.9 (H) 1.7 - 7.7 K/uL  Lymphocytes Relative 10 %   Lymphs Abs 1.2 0.7 - 4.0 K/uL   Monocytes Relative 8 %   Monocytes Absolute 1.0 0.1 - 1.0 K/uL   Eosinophils Relative 1 %   Eosinophils Absolute 0.1 0.0 - 0.5 K/uL   Basophils Relative 0 %   Basophils Absolute 0.0 0.0 - 0.1 K/uL   Immature Granulocytes 1 %   Abs Immature Granulocytes 0.12 (H) 0.00 - 0.07 K/uL    Comment: Performed at [facility], [place] [address], [place], [place] [address]  Comprehensive metabolic panel     Status: Abnormal   Collection Time: 05/25/19 10:22 AM  Result Value Ref Range   Sodium 135 135 - 145 mmol/L   Potassium 5.7 (H) 3.5 - 5.1 mmol/L   Chloride 95 (L) 98 - 111 mmol/L   CO2 25 22 - 32 mmol/L   Glucose, Bld 123 (H) 70 - 99 mg/dL   BUN 69 (H) 8 - 23 mg/dL   Creatinine, Ser 3.99 (H) 0.61 - 1.24  mg/dL   Calcium 9.1 8.9 - 10.3 mg/dL   Total Protein 8.1 6.5 - 8.1 g/dL   Albumin 3.4 (L) 3.5 - 5.0 g/dL   AST 24 15 - 41 U/L   ALT 22 0 - 44 U/L   Alkaline Phosphatase 83 38 - 126 U/L   Total Bilirubin 0.8 0.3 - 1.2 mg/dL   GFR calc non Af Amer 14 (L) >60 mL/min   GFR calc Af Amer 16 (L) >60 mL/min   Anion gap 15 5 - 15    Comment: Performed at [facility], [place] [address] [place], [place] [address]  D-dimer, quantitative     Status: Abnormal   Collection Time: 05/25/19 10:22 AM  Result Value Ref Range   D-Dimer, Quant 5.10 (H) 0.00 - 0.50 ug/mL-FEU    Comment: (NOTE) At the manufacturer cut-off of 0.50 ug/mL FEU, this assay has been documented to exclude PE with a sensitivity and negative predictive value of 97 to 99%.  At this time, this assay has not been approved by the FDA to exclude DVT/VTE. Results should be correlated with clinical presentation. Performed at [facility], [place] [address] [place], [place] [address]   Procalcitonin     Status: None   Collection Time: 05/25/19 10:22 AM  Result Value Ref Range   Procalcitonin 0.16 ng/mL    Comment:        Interpretation: PCT (Procalcitonin) <= 0.5 ng/mL: Systemic infection (sepsis) is not likely. Local bacterial infection is possible. (NOTE)       Sepsis PCT Algorithm           Lower Respiratory Tract                                      Infection PCT Algorithm    ----------------------------     ----------------------------         PCT < 0.25 ng/mL                PCT < 0.10 ng/mL         Strongly encourage             Strongly discourage   discontinuation of antibiotics    initiation of antibiotics    ----------------------------     -----------------------------       PCT 0.25 - 0.50 ng/mL  PCT 0.10 - 0.25 ng/mL               OR       >80% decrease in PCT            Discourage initiation of                                            antibiotics      Encourage  discontinuation           of antibiotics    ----------------------------     -----------------------------         PCT >= 0.50 ng/mL              PCT 0.26 - 0.50 ng/mL               AND        <80% decrease in PCT             Encourage initiation of                                             antibiotics       Encourage continuation           of antibiotics    ----------------------------     -----------------------------        PCT >= 0.50 ng/mL                  PCT > 0.50 ng/mL               AND         increase in PCT                  Strongly encourage                                      initiation of antibiotics    Strongly encourage escalation           of antibiotics                                     -----------------------------                                           PCT <= 0.25 ng/mL                                                 OR                                        > 80% decrease in PCT  Discontinue / Do not initiate                                             antibiotics Performed at [place] [address] [place], [place] [address]   Lactate dehydrogenase     Status: None   Collection Time: 05/25/19 10:22 AM  Result Value Ref Range   LDH 108 98 - 192 U/L    Comment: Performed at [facility], [place] [address], [place], [place] [address]  Ferritin     Status: None   Collection Time: 05/25/19 10:22 AM  Result Value Ref Range   Ferritin 200 24 - 336 ng/mL    Comment: Performed at [facility], [place] [address] [place], [place] [address]  Triglycerides     Status: None   Collection Time: 05/25/19 10:22 AM  Result Value Ref Range   Triglycerides 75 <150 mg/dL    Comment: Performed at [facility], [place] [address] [place], [place] [address]  Fibrinogen     Status: Abnormal   Collection Time: 05/25/19 10:22 AM  Result Value Ref Range    Fibrinogen 784 (H) 210 - 475 mg/dL    Comment: Performed at [facility], [place] [address], [place], [place] [address]  C-reactive protein     Status: Abnormal   Collection Time: 05/25/19 10:22 AM  Result Value Ref Range   CRP 12.7 (H) <1.0 mg/dL    Comment: Performed at [facility], [place] [address], [place], [place] [address]  Digoxin level     Status: Abnormal   Collection Time: 05/25/19 10:22 AM  Result Value Ref Range   Digoxin Level <0.2 (L) 0.8 - 2.0 ng/mL    Comment: REPEATED TO VERIFY Performed at [place] [address] [place], [place] [address]   Brain natriuretic peptide     Status: None   Collection Time: 05/25/19 10:22 AM  Result Value Ref Range   B Natriuretic Peptide 33.4 0.0 - 100.0 pg/mL    Comment: Performed at [facility], [place] [address] [place], [place] [address]  Lipase, blood     Status: Abnormal   Collection Time: 05/25/19 10:22 AM  Result Value Ref Range   Lipase 127 (H) 11 - 51 U/L    Comment: Performed at [facility], [place] [address], [place],  [address]  SARS Coronavirus 2 [facility] order, Performed in [facility] hospital lab) Nasopharyngeal Nasopharyngeal Swab     Status: Abnormal   Collection Time: 05/25/19 10:58 AM   Specimen: Nasopharyngeal Swab  Result Value Ref Range   SARS Coronavirus 2 POSITIVE (A) NEGATIVE    Comment: RESULT CALLED TO, READ BACK BY AND VERIFIED WITH: TIM SMITH,RN [id number] @ [id number] BY J SCOTTON (NOTE) If result is NEGATIVE SARS-CoV-2 target nucleic acids are NOT DETECTED. The SARS-CoV-2 RNA is generally detectable in upper and lower  respiratory specimens during the acute phase of infection. The lowest  concentration of SARS-CoV-2 viral copies this assay can detect is 250  copies / mL. A negative result does not preclude SARS-CoV-2 infection  and should not be used as the sole basis for treatment or other  patient  management decisions.  A negative result may occur with  improper specimen collection / handling, submission of specimen other  than  nasopharyngeal swab, presence of viral mutation(s) within the  areas targeted by this assay, and inadequate number of viral copies  (<250 copies / mL). A negative result must be combined with clinical  observations, patient history, and epidemiological information. If result is POSITIVE SARS-CoV-2 target nucleic acids are DETECTED . The SARS-CoV-2 RNA is generally detectable in upper and lower  respiratory specimens during the acute phase of infection.  Positive  results are indicative of active infection with SARS-CoV-2.  Clinical  correlation with patient history and other diagnostic information is  necessary to determine patient infection status.  Positive results do  not rule out bacterial infection or co-infection with other viruses. If result is PRESUMPTIVE POSTIVE SARS-CoV-2 nucleic acids MAY BE PRESENT.   A presumptive positive result was obtained on the submitted specimen  and confirmed on repeat testing.  While 2019 novel coronavirus  (SARS-CoV-2) nucleic acids may be present in the submitted sample  additional confirmatory testing may be necessary for epidemiological  and / or clinical management purposes  to differentiate between  SARS-CoV-2 and other Sarbecovirus currently known to infect humans.  If clinically indicated additional testing with an alternate test  methodology [phone] ) is advised. The SARS-CoV-2 RNA is generally  detectable in upper and lower respiratory specimens during the acute  phase of infection. The expected result is Negative. Fact Sheet for Patients:  StrictlyIdeas.no Fact Sheet for Healthcare Providers: BankingDealers.co.za This test is not yet approved or cleared by the [place] FDA and has been authorized for detection and/or diagnosis of SARS-CoV-2 by FDA under  an Emergency Use Authorization (EUA).  This EUA will remain in effect (meaning this test can be used) for the duration of the COVID-19 declaration under Section 564(b)(1) of the Act, 21 U.S.C. section 360bbb-3(b)(1), unless the authorization is terminated or revoked sooner. Performed at [facility], [place] [address], [place], [place] [address]    Dg Chest Port 1 View  Result Date: 05/25/2019 CLINICAL DATA:  Increased shortness of breath EXAM: PORTABLE CHEST 1 VIEW COMPARISON:  04/11/2019 FINDINGS: Cardiac shadow is stable. Aortic calcifications are noted. Increased interstitial markings are seen particularly on the left likely related to underlying edema. No sizable effusion is seen. No bony abnormality is noted. IMPRESSION: Increasing interstitial changes likely related to underlying edema. Electronically Signed   By: Inez Catalina M.D.   On: 05/25/2019 10:46    Pending Labs Unresulted Labs (From admission, onward)    Start     Ordered   05/26/19 0500  CBC with Differential/Platelet  Daily,   R     05/25/19 1414   05/26/19 0500  Comprehensive metabolic panel  Daily,   R     05/25/19 1414   05/26/19 0500  C-reactive protein  Daily,   R     05/25/19 1414   05/26/19 0500  D-dimer, quantitative (not at [facility])  Daily,   R     05/25/19 1414   05/26/19 0500  Ferritin  Daily,   R     05/25/19 1414   05/26/19 0500  Magnesium  Daily,   R     05/25/19 1414   05/26/19 0500  Phosphorus  Daily,   R     05/25/19 1414   05/26/19 0500  Triglycerides  Daily,   R     05/25/19 1414   05/25/19 [id number]  Basic metabolic panel  Once,   STAT     05/25/19 1442   05/25/19 1413  ABO/Rh  Once,  STAT     05/25/19 1414   05/25/19 1124  Urine culture  ONCE - STAT,   STAT     05/25/19 1123   05/25/19 1124  Urinalysis, Routine w reflex microscopic  ONCE - STAT,   STAT     05/25/19 1123   05/25/19 0951  Blood Culture (routine x 2)  BLOOD CULTURE X 2,   STAT     05/25/19 0951   05/25/19 0951   Gastrointestinal Panel by PCR , Stool  (Gastrointestinal Panel by PCR, Stool)  Once,   STAT     05/25/19 0951   Signed and Held  CBC  (heparin)  Once,   R    Comments: Baseline for heparin therapy IF NOT ALREADY DRAWN.  Notify MD if PLT < 100 K.    Signed and Held   Signed and Held  Creatinine, serum  (heparin)  Once,   R    Comments: Baseline for heparin therapy IF NOT ALREADY DRAWN.    Signed and Held          Vitals/Pain Today's Vitals   05/25/19 1430 05/25/19 1445 05/25/19 1500 05/25/19 1530  BP: (!) 96/57 (!) 102/53 (!) 83/53 (!) 80/53  Pulse: 68 75 62 68  Resp: 19 (!) 35 (!) 30 (!) 26  Temp:      TempSrc:      SpO2: 98% 98% 95% 96%  Weight:      PainSc:        Isolation Precautions Airborne and Contact precautions  Medications Medications  dexamethasone (DECADRON) injection 6 mg (has no administration in time range)  vitamin C (ASCORBIC ACID) tablet 500 mg (has no administration in time range)  zinc sulfate capsule 220 mg (has no administration in time range)  guaiFENesin-dextromethorphan (ROBITUSSIN DM) 100-10 MG/5ML syrup 10 mL (has no administration in time range)  chlorpheniramine-HYDROcodone (TUSSIONEX) 10-8 MG/5ML suspension 5 mL (has no administration in time range)  famotidine (PEPCID) tablet 20 mg (has no administration in time range)  0.9 %  sodium chloride infusion ( Intravenous New Bag/Given (Non-Interop) 05/25/19 1542)  remdesivir 200 mg in sodium chloride 0.9 % 250 mL IVPB (has no administration in time range)    Followed by  remdesivir 100 mg in sodium chloride 0.9 % 250 mL IVPB (has no administration in time range)  sodium chloride 0.9 % bolus 500 mL (0 mLs Intravenous Stopped 05/25/19 1401)  ondansetron (ZOFRAN) injection 4 mg (4 mg Intravenous Given 05/25/19 1242)  sodium chloride 0.9 % bolus 500 mL (500 mLs Intravenous New Bag/Given (Non-Interop) 05/25/19 1402)    Mobility walks

## 2019-05-26 ENCOUNTER — Inpatient Hospital Stay (HOSPITAL_COMMUNITY): Payer: Medicare Other

## 2019-05-26 DIAGNOSIS — N179 Acute kidney failure, unspecified: Secondary | ICD-10-CM

## 2019-05-26 LAB — CBC WITH DIFFERENTIAL/PLATELET
Abs Immature Granulocytes: 0.08 10*3/uL — ABNORMAL HIGH (ref 0.00–0.07)
Basophils Absolute: 0 10*3/uL (ref 0.0–0.1)
Basophils Relative: 0 %
Eosinophils Absolute: 0 10*3/uL (ref 0.0–0.5)
Eosinophils Relative: 0 %
HCT: 45.6 % (ref 39.0–52.0)
Hemoglobin: 14.1 g/dL (ref 13.0–17.0)
Immature Granulocytes: 1 %
Lymphocytes Relative: 4 %
Lymphs Abs: 0.4 10*3/uL — ABNORMAL LOW (ref 0.7–4.0)
MCH: 29.3 pg (ref 26.0–34.0)
MCHC: 30.9 g/dL (ref 30.0–36.0)
MCV: 94.6 fL (ref 80.0–100.0)
Monocytes Absolute: 0.1 10*3/uL (ref 0.1–1.0)
Monocytes Relative: 1 %
Neutro Abs: 8.8 10*3/uL — ABNORMAL HIGH (ref 1.7–7.7)
Neutrophils Relative %: 94 %
Platelets: 419 10*3/uL — ABNORMAL HIGH (ref 150–400)
RBC: 4.82 MIL/uL (ref 4.22–5.81)
RDW: 14.6 % (ref 11.5–15.5)
WBC: 9.3 10*3/uL (ref 4.0–10.5)
nRBC: 0 % (ref 0.0–0.2)

## 2019-05-26 LAB — URINE CULTURE: Culture: NO GROWTH

## 2019-05-26 LAB — C-REACTIVE PROTEIN: CRP: 11.7 mg/dL — ABNORMAL HIGH (ref <1.0)

## 2019-05-26 LAB — COMPREHENSIVE METABOLIC PANEL
ALT: 17 U/L (ref 0–44)
AST: 17 U/L (ref 15–41)
Albumin: 2.7 g/dL — ABNORMAL LOW (ref 3.5–5.0)
Alkaline Phosphatase: 71 U/L (ref 38–126)
Anion gap: 7 (ref 5–15)
BUN: 58 mg/dL — ABNORMAL HIGH (ref 8–23)
CO2: 22 mmol/L (ref 22–32)
Calcium: 8.4 mg/dL — ABNORMAL LOW (ref 8.9–10.3)
Chloride: 109 mmol/L (ref 98–111)
Creatinine, Ser: 2.69 mg/dL — ABNORMAL HIGH (ref 0.61–1.24)
GFR calc Af Amer: 26 mL/min — ABNORMAL LOW (ref >60)
GFR calc non Af Amer: 22 mL/min — ABNORMAL LOW (ref >60)
Glucose, Bld: 121 mg/dL — ABNORMAL HIGH (ref 70–99)
Potassium: 5.6 mmol/L — ABNORMAL HIGH (ref 3.5–5.1)
Sodium: 138 mmol/L (ref 135–145)
Total Bilirubin: 0.5 mg/dL (ref 0.3–1.2)
Total Protein: 6.8 g/dL (ref 6.5–8.1)

## 2019-05-26 LAB — EXPECTORATED SPUTUM ASSESSMENT W GRAM STAIN, RFLX TO RESP C

## 2019-05-26 LAB — POTASSIUM
Potassium: 5.1 mmol/L (ref 3.5–5.1)
Potassium: 5.9 mmol/L — ABNORMAL HIGH (ref 3.5–5.1)
Potassium: 5.3 mmol/L — ABNORMAL HIGH (ref 3.5–5.1)
Potassium: 5 mmol/L (ref 3.5–5.1)

## 2019-05-26 LAB — NA AND K (SODIUM & POTASSIUM), RAND UR
Potassium Urine: 43 mmol/L
Sodium, Ur: 35 mmol/L

## 2019-05-26 LAB — MAGNESIUM: Magnesium: 2.6 mg/dL — ABNORMAL HIGH (ref 1.7–2.4)

## 2019-05-26 LAB — SAMPLE TO BLOOD BANK

## 2019-05-26 LAB — PHOSPHORUS: Phosphorus: 4 mg/dL (ref 2.5–4.6)

## 2019-05-26 LAB — FERRITIN: Ferritin: 154 ng/mL (ref 24–336)

## 2019-05-26 LAB — GLUCOSE, CAPILLARY
Glucose-Capillary: 103 mg/dL — ABNORMAL HIGH (ref 70–99)
Glucose-Capillary: 186 mg/dL — ABNORMAL HIGH (ref 70–99)

## 2019-05-26 LAB — TRIGLYCERIDES: Triglycerides: 42 mg/dL (ref <150)

## 2019-05-26 LAB — LIPASE, BLOOD: Lipase: 43 U/L (ref 11–51)

## 2019-05-26 LAB — ABO/RH: ABO/RH(D): O POS

## 2019-05-26 LAB — D-DIMER, QUANTITATIVE: D-Dimer, Quant: 4.51 ug/mL-FEU — ABNORMAL HIGH (ref 0.00–0.50)

## 2019-05-26 MED ORDER — INSULIN ASPART 100 UNIT/ML IV SOLN
10.0000 [IU] | Freq: Once | INTRAVENOUS | Status: AC
  Administered 2019-05-26: 10 [IU] via INTRAVENOUS

## 2019-05-26 MED ORDER — ALBUTEROL SULFATE (2.5 MG/3ML) 0.083% IN NEBU
10.0000 mg | INHALATION_SOLUTION | Freq: Once | RESPIRATORY_TRACT | Status: DC
  Filled 2019-05-26: qty 12

## 2019-05-26 MED ORDER — SODIUM ZIRCONIUM CYCLOSILICATE 10 G PO PACK
10.0000 g | PACK | Freq: Every day | ORAL | Status: DC
  Filled 2019-05-26: qty 1

## 2019-05-26 MED ORDER — VITAMIN D 25 MCG (1000 UNIT) PO TABS
1000.0000 [IU] | ORAL_TABLET | Freq: Every day | ORAL | Status: DC
  Administered 2019-05-26 – 2019-06-03 (×8): 1000 [IU] via ORAL
  Filled 2019-05-26 (×8): qty 1

## 2019-05-26 MED ORDER — SODIUM BICARBONATE 8.4 % IV SOLN
Freq: Once | INTRAVENOUS | Status: AC
  Administered 2019-05-26: 16:00:00 via INTRAVENOUS
  Filled 2019-05-26: qty 100

## 2019-05-26 MED ORDER — ALBUMIN HUMAN 25 % IV SOLN
25.0000 g | Freq: Once | INTRAVENOUS | Status: AC
  Administered 2019-05-26: 06:00:00 25 g via INTRAVENOUS
  Filled 2019-05-26: qty 50

## 2019-05-26 MED ORDER — DEXTROSE 50 % IV SOLN: 1.0000 | Freq: Once | INTRAVENOUS | Status: DC

## 2019-05-26 MED ORDER — DEXTROSE 50 % IV SOLN
INTRAVENOUS | Status: AC
  Filled 2019-05-26: qty 50

## 2019-05-26 MED ORDER — PANTOPRAZOLE SODIUM 40 MG IV SOLR
40.0000 mg | INTRAVENOUS | Status: DC
  Administered 2019-05-26 – 2019-05-28 (×3): 40 mg via INTRAVENOUS
  Filled 2019-05-26 (×3): qty 40

## 2019-05-26 MED ORDER — SODIUM CHLORIDE 0.9 % IV BOLUS
500.0000 mL | Freq: Once | INTRAVENOUS | Status: AC
  Administered 2019-05-26: 500 mL via INTRAVENOUS

## 2019-05-26 MED ORDER — ALBUMIN HUMAN 25 % IV SOLN
INTRAVENOUS | Status: AC
  Filled 2019-05-26: qty 50

## 2019-05-26 MED ORDER — MORPHINE SULFATE (PF) 2 MG/ML IV SOLN
INTRAVENOUS | Status: AC
  Filled 2019-05-26: qty 1

## 2019-05-26 MED ORDER — DEXTROSE 50 % IV SOLN
25.0000 g | Freq: Once | INTRAVENOUS | Status: AC
  Administered 2019-05-26: 25 g via INTRAVENOUS

## 2019-05-26 MED ORDER — IPRATROPIUM BROMIDE HFA 17 MCG/ACT IN AERS
2.0000 | INHALATION_SPRAY | Freq: Three times a day (TID) | RESPIRATORY_TRACT | Status: DC
  Administered 2019-05-26 – 2019-06-03 (×25): 2 via RESPIRATORY_TRACT
  Filled 2019-05-26 (×2): qty 12.9

## 2019-05-26 MED ORDER — METHYLPREDNISOLONE SODIUM SUCC 125 MG IJ SOLR
60.0000 mg | Freq: Two times a day (BID) | INTRAMUSCULAR | Status: DC
  Administered 2019-05-26 – 2019-05-31 (×11): 60 mg via INTRAVENOUS
  Filled 2019-05-26 (×11): qty 2

## 2019-05-26 MED ORDER — LEVALBUTEROL TARTRATE 45 MCG/ACT IN AERO
2.0000 | INHALATION_SPRAY | Freq: Three times a day (TID) | RESPIRATORY_TRACT | Status: DC
  Administered 2019-05-26 – 2019-06-03 (×25): 2 via RESPIRATORY_TRACT
  Filled 2019-05-26 (×2): qty 15

## 2019-05-26 MED ORDER — DEXTROSE 50 % IV SOLN
50.0000 mL | Freq: Once | INTRAVENOUS | Status: AC
  Administered 2019-05-26: 50 mL via INTRAVENOUS
  Filled 2019-05-26: qty 50

## 2019-05-26 MED ORDER — PANTOPRAZOLE SODIUM 40 MG PO TBEC
40.0000 mg | DELAYED_RELEASE_TABLET | Freq: Every day | ORAL | Status: DC
  Filled 2019-05-26: qty 1

## 2019-05-26 MED ORDER — PHENYLEPHRINE HCL-NACL 10-0.9 MG/250ML-% IV SOLN: 0.0000 ug/min | INTRAVENOUS | Status: DC

## 2019-05-26 MED ORDER — CALCIUM GLUCONATE-NACL 1-0.675 GM/50ML-% IV SOLN
1.0000 g | Freq: Once | INTRAVENOUS | Status: AC
  Administered 2019-05-26: 1000 mg via INTRAVENOUS
  Filled 2019-05-26: qty 50

## 2019-05-26 MED ORDER — HYDROMORPHONE HCL 1 MG/ML IJ SOLN
0.5000 mg | INTRAMUSCULAR | Status: DC | PRN
  Administered 2019-05-26 – 2019-05-27 (×2): 0.5 mg via INTRAVENOUS
  Filled 2019-05-26: qty 0.5
  Filled 2019-05-26: qty 1

## 2019-05-26 MED ORDER — MORPHINE SULFATE (PF) 2 MG/ML IV SOLN
1.0000 mg | INTRAVENOUS | Status: DC | PRN
  Administered 2019-05-26: 1 mg via INTRAVENOUS

## 2019-05-26 MED ORDER — SODIUM CHLORIDE 0.9 % IV SOLN
INTRAVENOUS | Status: DC
  Administered 2019-05-26 – 2019-05-27 (×2): via INTRAVENOUS

## 2019-05-26 MED ORDER — SODIUM POLYSTYRENE SULFONATE 15 GM/60ML PO SUSP
45.0000 g | Freq: Once | ORAL | Status: AC
  Administered 2019-05-26: 45 g via RECTAL
  Filled 2019-05-26: qty 180

## 2019-05-26 NOTE — Progress Notes (Signed)
Per report from patient's primary RN and charge RN, patient requests no information be shared about his hospital stay with anyone  except with his listed contact. His daughter called requesting information and Charge RN shared with her  patient's wishes and offered to have her speak directly to the patient to receive information. Daughter was understandably upset and refused to speak with patient. Charge Rn communicated to her that patient is alert and oriented and we are following his wishes.

## 2019-05-26 NOTE — Progress Notes (Signed)
Gave pt 1 pill this afternoon, and pt unable to tolerate the PO med. He started to cough, and coughed up white to green sputum, suction was provided to the Pt.  MD was notified of the situation and Pt was made NPO,

## 2019-05-26 NOTE — Plan of Care (Signed)

## 2019-05-26 NOTE — Evaluation (Signed)
Clinical/Bedside Swallow Evaluation Patient Details  Name: Kristopher [name] MRN: [id number] Date of Birth: 1946/07/22  Today's Date: 05/26/2019 Time: SLP Start Time (ACUTE ONLY): [id number] SLP Stop Time (ACUTE ONLY): 0950 SLP Time Calculation (min) (ACUTE ONLY): 45 min  Past Medical History:  Past Medical History:  Diagnosis Date  . Anxiety    takes Xanax prn  . Cardiomyopathy    takes Digoxin daily  . Depression    takes Prozac daily  . Dyslipidemia    takes Simvastatin daily  . HTN (hypertension)    takes Carvedilol and Lisinopril daily  . Impaired hearing    left   Past Surgical History:  Past Surgical History:  Procedure Laterality Date  . CHOLECYSTECTOMY     > 44yrs ago  . COLONOSCOPY    . HERNIA REPAIR    . kidney removed     but unsure of which one;>62yrs ago  . MASS EXCISION Left 06/14/2013   Procedure: EXCISION of left chest wall MASS;  Surgeon: Madilyn Hook, DO;  Location: [place];  Service: General;  Laterality: Left;  . SPLENECTOMY     >56yrs ago   HPI:  Patient is a 73 y.o. 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 He presented to hospital after 1 week h/o nausea, vomitting, diarrhea and was found to be hypoxic and COVID-19 POSITIVE.   Assessment / Plan / Recommendation Clinical Impression  Patient presents with a mild oral and a mild-moderate pharyngeal dysphagia. Oral phase is characterized by decreased bilabial seal around spoon and mildly delayed anterior to posterior transit of bolus with puree solids. Pharyngeally, patient exhbiited cough (productive) after straw sips of thin liquids approximately 60% of the time  He was able to cough up and expectorate thick light yellow phlegm, which he reported he was doing at home as well. Voice was clear after  expectoration of phlegm. He did not exhibit any overt s/s of aspiraiton or penetration with puree solids. Although coughing appears to be water helping to loosen up pharyngeal secretions, will proceed conservatively secondary to reduced respiratory status and COVID-19 positive. Recommendation at this time is for NPO except meds in applesauce and PRN sips of water or ice chips. Per RN and MD, patient will be transferred to [facility]: specialized for Covid patients)  if bed available. SLP Visit Diagnosis: Dysphagia, oropharyngeal phase (R13.12)    Aspiration Risk  Moderate aspiration risk    Diet Recommendation NPO except meds;Ice chips PRN after oral care;Other (Comment)(sips of water or ice chips PRN)   Liquid Administration via: Straw;Cup Medication Administration: Whole meds with puree(crush if not tolerating whole) Supervision: Full supervision/cueing for compensatory strategies;Staff to assist with self feeding Compensations: Minimize environmental distractions;Slow rate;Small sips/bites Postural Changes: Seated upright at 90 degrees    Other  Recommendations Oral Care Recommendations: Oral care QID Other Recommendations: Have oral suction available   Follow up Recommendations Other (comment)(TBD pending progress)      Frequency and Duration min 2x/week  1 week       Prognosis Prognosis for Safe Diet Advancement: Good      Swallow Study   General Date of Onset: 05/25/19 HPI: Patient is a 73 y.o. 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 He presented to hospital after 1 week h/o nausea, vomitting, diarrhea and was found to be hypoxic and COVID-19 POSITIVE. Type of Study: Bedside Swallow Evaluation Previous Swallow Assessment: N/A Diet Prior to this  Study: NPO Respiratory Status: Nasal cannula History of Recent Intubation: No Behavior/Cognition: Alert;Cooperative;Pleasant mood Oral Cavity Assessment: Dry Oral Care Completed by SLP: Yes Oral Cavity - Dentition: Poor condition;Missing dentition Vision: Functional for self-feeding Self-Feeding Abilities: Needs assist;Needs set up;Able to feed self Patient Positioning: Upright in bed Baseline Vocal Quality: Low vocal intensity;Normal Volitional Cough: Strong Volitional Swallow: Able to elicit    Oral/Motor/Sensory Function Overall Oral Motor/Sensory Function: Within functional limits   Ice Chips Ice chips: Within functional limits Other Comments: No overt s/s of aspiration or penetration with ice chips (small amount)   Thin Liquid Thin Liquid: Impaired Presentation: Straw Pharyngeal  Phase Impairments: Suspected delayed Swallow;Cough - Immediate;Throat Clearing - Immediate Other Comments: Patient expectorated thick light yellow phlegm after coughing after sips of water    Nectar Thick     Honey Thick     Puree Puree: Impaired Oral Phase Impairments: Reduced labial seal Oral Phase Functional Implications: Prolonged oral transit   Solid     Solid: Not tested      Dannial Monarch 05/26/2019,11:27 AM   Sonia Baller, MA, CCC-SLP Speech Therapy [facility] Pager: [phone]

## 2019-05-26 NOTE — Progress Notes (Signed)
PROGRESS NOTE  Kristopher [name] [id number] DOB: [birth year] DOA: 05/25/2019 PCP: Alvester Chou, NP  HPI/Recap of past 24 hours: Kristopher [name] is a 73 y.o. 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  Sx started about 1 week ago.  He notes nausea, vomiting, diarrhea.  Vomiting is nonbloody.  No blood in stool.  He's noticed subjective fevers and chills.  He denies sick contacts or recent travel.  He smokes, but recently has been smoking less.  He notes he's been progressively getting weaker.  EMS found him with sats of 88% and placed him on NRB.  He denies CP, headache, lower extremity edema.  05/26/19: Patient was seen and examined at his bedside this morning.  Reports persistent nausea and abdominal pain, no vomiting.  Denies chest pain or diarrhea.  Mild to moderate conversational dyspnea noted on exam.   Assessment/Plan: Active Problems:   COVID-19 virus infection   AKI (acute kidney injury) ([place])   Sepsis secondary to COVID-19 viral pneumonia Presented with nausea vomiting diarrhea, found to be hypoxic with hypothermia temperature 97.4 with positive COVID-19, along with leukocytosis with WBC 12.7 and respiration rate of 30 Requiring nonrebreather initially, hypotensive requiring IV fluid boluses First positive COVID-19 on 05/25/2019 Personally reviewed chest x-ray and CT abdomen and pelvis wo contrast which showed bilateral patchy infiltrates. Elevated inflammatory markers, trend. Started on Remdesivir on admission.  No transaminitis.  Continue to monitor LFTs. Added Solu-Medrol 60 mg twice daily, Xopenex inhalers 2 puffs 3 times daily, ipratropium inhaler 2 puffs 3 times daily, vitamin D3 Continue p.o. zinc and vitamin C supplements Maintain O2 saturation greater than  94% Currently on 5 L and saturating 97%  Acute hypoxic respiratory failure secondary to COVID-19 valve pneumonia Management as stated above Continue IV steroids, bronchodilators and oxygen supplementation  GI disturbances: Diarrhea, nausea likely secondary to COVID-19 viral infection Treat symptomatically Continue IV antiemetics and IV pain meds due to n.p.o. status until dysphagia is ruled out This morning reports persistent nausea and abdominal pain and denies diarrhea DC IV fluid when he can tolerate p.o.  AKI in the setting of solitary kidney post left nephrectomy, likely prerenal from dehydration No baseline creatinine to compare Presented with creatinine of 3.99, creatinine is trending down to 2.69 Initially received IV fluid boluses Decrease the rate of IV fluid from 100 cc/h to 50 cc/h normal saline Continue gentle IV fluid hydration x1 day Closely monitor volume status. Good urine output, on 1.1 L reported in the last 24 hours  Hypotension in the setting of sepsis Has received IV fluid boluses Map has been as low as 53 last night Ongoing hypotension  IV albumin 25 g once Maintain map greater than 65 Procalcitonin essentially negative 0.16 on 05/25/2019.  Moderate hyperkalemia in the setting of acute kidney injury Presented with potassium 6.5 Last potassium 5.9 Per bedside RN patient is not able to swallow safely, speech therapy has been consulted to complete swallow evaluation-hold off Lokelma, now n.p.o. Give IV calcium gluconate 1 g once Treat hyperkalemia with 10 unit bolus NovoLog once +25 g IV D50 once Repeat BMP in the morning  Chronic systolic CHF Last 2D echo done in 2014 revealed LVEF 40% Caution with IV fluid Currently euvolemic Start strict I's and O's and daily weight Hold off diuretics due to hypotension  Suspected dysphagia  Per bedside RN patient has been coughing while swallowing liquids N.p.o. 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 Nicotine patch as needed  Risks: Patient is at high risk for decompensation due to sepsis secondary to acute COVID-19 viral pneumonia, ongoing hypotension in the setting of sepsis, multiple comorbidities and advanced age.  Patient will require at least 2 midnights for further evaluation and treatment of present condition.   DVT prophylaxis: heparin, high dose  subcu heparin 3 times daily Code Status: full Family Communication:  None at bedside.  Son [phone])  Disposition Plan:  Transfer to [facility] to continue care. Consults called: none      Objective: Vitals:   05/26/19 0506 05/26/19 0600 05/26/19 0700 05/26/19 0800  BP: (!) 107/46 (!) 105/42 (!) 118/48 (!) 105/42  Pulse: 65 76 77 66  Resp: (!) 28 (!) 28 (!) 23 (!) 22  Temp:      TempSrc:      SpO2: 96% 93% 96% 98%  Weight:        Intake/Output Summary (Last 24 hours) at 05/26/2019 0843 Last data filed at 05/26/2019 0800 Gross per 24 hour  Intake 4609.45 ml  Output 1125 ml  Net 3484.45 ml   Filed Weights   05/25/19 1059 05/26/19 0500  Weight: 63.5 kg 61.5 kg    Exam:   General: 73 y.o. year-old male well developed well nourished in no acute distress.  Alert and interactive.  Cardiovascular: Regular rate and rhythm with no rubs or gallops.  No thyromegaly or JVD noted.    Respiratory: Diffuse mild rales bilaterally.  No wheezes noted.    Abdomen: Soft tender on palpation diffusely nondistended with bowel  sounds x4 quadrants.  Musculoskeletal: Trace lower extremity edema. 2/4 pulses in all 4 extremities.  Psychiatry: Mood is appropriate for condition and setting   Data Reviewed: CBC: Recent Labs  Lab 05/25/19 1022 05/26/19 0105  WBC 12.4* 9.3  NEUTROABS 9.9* 8.8*  HGB 16.4 14.1  HCT 50.6 45.6  MCV 92.5 94.6  PLT 405* 123XX123*   Basic Metabolic Panel: Recent Labs  Lab 05/25/19 1022 05/25/19 1821 05/25/19 1951 05/25/19 2249 05/26/19 0105 05/26/19 0624  NA 135 137 135  --  138  --   K 5.7* 6.4* 6.5* 5.1 5.6* 5.9*  CL 95* 103 104  --  109  --   CO2 25 21* 21*  --  22  --   GLUCOSE 123* 65* 253*  --  121*  --   BUN 69* 66* 63*  --  58*  --   CREATININE 3.99* 3.41* 3.41*  --  2.69*  --   CALCIUM 9.1 8.4* 7.9*  --  8.4*  --   MG  --   --   --   --  2.6*  --   PHOS  --   --   --   --  4.0  --    GFR: CrCl cannot be calculated (Unknown ideal weight.). Liver Function Tests: Recent Labs  Lab 05/25/19 1022 05/26/19 0105  AST 24 17  ALT 22 17  ALKPHOS 83 71  BILITOT 0.8 0.5  PROT 8.1 6.8  ALBUMIN 3.4* 2.7*   Recent Labs  Lab 05/25/19 1022 05/26/19 0105  LIPASE 127* 43   No results for input(s): AMMONIA in the last 168 hours. Coagulation Profile: No results for input(s): INR, PROTIME in the last 168 hours. Cardiac Enzymes: No results for input(s): CKTOTAL, CKMB, CKMBINDEX, TROPONINI in the last 168 hours. BNP (last 3 results) No results for input(s): PROBNP in the last 8760 hours. HbA1C: No results for input(s): HGBA1C in the last 72 hours. CBG: No results for input(s): GLUCAP in the last 168 hours. Lipid Profile: Recent Labs    05/25/19 1022 05/26/19 0105  TRIG 75 42   Thyroid Function Tests: No results for input(s): TSH, T4TOTAL, FREET4, T3FREE, THYROIDAB in the last 72 hours. Anemia Panel: Recent Labs    05/25/19 1022 05/26/19 0105  FERRITIN 200 154   Urine analysis:    Component Value Date/Time   COLORURINE YELLOW 05/25/2019 2252    APPEARANCEUR HAZY (A) 05/25/2019 2252   LABSPEC 1.013 05/25/2019 2252   PHURINE 5.0 05/25/2019 2252   GLUCOSEU NEGATIVE 05/25/2019 2252   HGBUR NEGATIVE 05/25/2019 2252   BILIRUBINUR NEGATIVE 05/25/2019 2252   KETONESUR NEGATIVE 05/25/2019 2252   PROTEINUR NEGATIVE 05/25/2019 2252   UROBILINOGEN 0.2 10/18/2012 0733   NITRITE NEGATIVE 05/25/2019 2252   LEUKOCYTESUR NEGATIVE 05/25/2019 2252   Sepsis Labs: @LABRCNTIP (procalcitonin:4,lacticidven:4)  ) Recent Results (from the past 240 hour(s))  SARS Coronavirus 2 [facility] order, Performed in [facility] hospital lab) Nasopharyngeal Nasopharyngeal Swab     Status: Abnormal   Collection Time: 05/25/19 10:58 AM   Specimen: Nasopharyngeal Swab  Result Value Ref Range Status   SARS Coronavirus 2 POSITIVE (A) NEGATIVE Final    Comment: RESULT CALLED TO, READ BACK BY AND VERIFIED WITH: TIM SMITH,RN IN:[id number] @ [facility] (NOTE) If result is NEGATIVE SARS-CoV-2 target nucleic acids are NOT DETECTED. The SARS-CoV-2 RNA is generally detectable in upper and lower  respiratory specimens during the acute phase of infection. The lowest  concentration of SARS-CoV-2 viral copies this assay can detect is 250  copies / mL. A negative result does not preclude SARS-CoV-2 infection  and should not be used as the sole basis for treatment or other  patient management decisions.  A negative result may occur with  improper specimen collection / handling, submission of specimen other  than nasopharyngeal swab, presence of viral mutation(s) within the  areas targeted by this assay, and inadequate number of viral copies  (<250 copies / mL). A negative result must be combined with clinical  observations, patient history, and epidemiological information. If result is POSITIVE SARS-CoV-2 target nucleic acids are DETECTED . The SARS-CoV-2 RNA is generally detectable in upper and lower  respiratory specimens during the acute phase of infection.  Positive   results are indicative of active infection with SARS-CoV-2.  Clinical  correlation with patient history and other diagnostic information is  necessary to determine patient infection status.  Positive results do  not rule out bacterial infection or co-infection with other viruses. If result is PRESUMPTIVE POSTIVE SARS-CoV-2 nucleic acids MAY BE PRESENT.   A presumptive positive result was obtained on the submitted specimen  and confirmed on repeat testing.  While 2019 novel coronavirus  (SARS-CoV-2) nucleic acids may be present in the submitted sample  additional confirmatory testing may be necessary for epidemiological  and / or clinical management purposes  to differentiate between  SARS-CoV-2 and other Sarbecovirus currently known to  infect humans.  If clinically indicated additional testing with an alternate test  methodology [phone] ) is advised. The SARS-CoV-2 RNA is generally  detectable in upper and lower respiratory specimens during the acute  phase of infection. The expected result is Negative. Fact Sheet for Patients:  StrictlyIdeas.no Fact Sheet for Healthcare Providers: BankingDealers.co.za This test is not yet approved or cleared by the [place] FDA and has been authorized for detection and/or diagnosis of SARS-CoV-2 by FDA under an Emergency Use Authorization (EUA).  This EUA will remain in effect (meaning this test can be used) for the duration of the COVID-19 declaration under Section 564(b)(1) of the Act, 21 U.S.C. section 360bbb-3(b)(1), unless the authorization is terminated or revoked sooner. Performed at [facility], [place] [address] [place], [place] [address]   Expectorated sputum assessment w rflx to resp cult     Status: None   Collection Time: 05/26/19  1:07 AM   Specimen: Sputum  Result Value Ref Range Status   Specimen Description SPUTUM  Final   Special Requests NONE  Final    Sputum evaluation   Final    THIS SPECIMEN IS ACCEPTABLE FOR SPUTUM CULTURE Performed at [facility], [place] [address] [place], [place] [address]    Report Status 05/26/2019 FINAL  Final      Studies: Ct Abdomen Pelvis Wo Contrast  Result Date: 05/25/2019 CLINICAL DATA:  73 year old male with history of smoking presenting with nausea vomiting and diarrhea. Patient was found to be hypoxic and COVID-19 positive. EXAM: CT ABDOMEN AND PELVIS WITHOUT CONTRAST TECHNIQUE: Multidetector CT imaging of the abdomen and pelvis was performed following the standard protocol without IV contrast. COMPARISON:  CT of the abdomen pelvis dated 08/21/2012. Chest radiograph dated 05/25/2019 FINDINGS: Evaluation of this exam is limited in the absence of intravenous contrast. Evaluation is also limited due to streak artifact caused by patient's arms as well as respiratory motion artifact. Lower chest: There is emphysematous changes of the lungs. Left lung base densities, new since the study of 2013 and concerning for superimposed infectious process. Clinical correlation is recommended. There is multi vessel coronary vascular calcification. No intra-abdominal free air or free fluid. Hepatobiliary: The liver is unremarkable. No intrahepatic biliary ductal dilatation. Cholecystectomy. Pancreas: Unremarkable. No pancreatic ductal dilatation or surrounding inflammatory changes. Spleen: Status post prior splenectomy with multiple residual splenic tissue. Adrenals/Urinary Tract: The adrenal glands are unremarkable. Postsurgical changes of prior left nephrectomy. Several right renal lesions which are not characterized on this unenhanced CT. The largest lesion in the upper pole of the right kidney measures up to 3.3 cm and demonstrates a fluid attenuation most consistent with a cyst. Several smaller high attenuating lesions are not characterized but may represent complex/hemorrhagic cysts. Ultrasound may provide better  characterization. There is no hydronephrosis or nephrolithiasis. Renal vascular calcifications noted. The right ureter and urinary bladder appear unremarkable. Stomach/Bowel: There is no bowel obstruction or active inflammation. The appendix is not visualized with certainty. No inflammatory changes identified in the right lower quadrant. Vascular/Lymphatic: There is advanced atherosclerotic calcification of the aorta and iliac arteries. The IVC is unremarkable. No portal venous gas. There is no adenopathy. Reproductive: Mildly enlarged prostate gland measuring approximately 4.3 cm in transverse axial diameter. Other: Midline vertical anterior pelvic wall incisional scar. There is a small fat containing ventral hernia the left of the midline. The neck of the hernia defect measures approximately 17 mm in transverse axial diameter. There is small amount of fluid and mild  stranding of the herniated fat. Correlation with clinical exam is recommended to exclude strangulation or incarceration. No herniated bowel identified. Musculoskeletal: Advanced osteopenia. Multilevel degenerative changes of the spine with enthesopathy and findings consistent with ankylosing spondylitis. No acute fracture. IMPRESSION: 1. Increased left lung base densities concerning for developing infiltrate. Clinical correlation is recommended. 2. No acute intra-abdominal or pelvic pathology. No bowel obstruction or active inflammation. 3. Small fat containing ventral hernia with mild stranding of the herniated fat. Correlation with clinical exam is recommended to exclude strangulation or incarceration. No herniated bowel. 4. Aortic Atherosclerosis (ICD10-I70.0) and Emphysema (ICD10-J43.9). Electronically Signed   By: Anner Crete M.D.   On: 05/25/2019 21:43   Dg Chest Port 1 View  Result Date: 05/25/2019 CLINICAL DATA:  Increased shortness of breath EXAM: PORTABLE CHEST 1 VIEW COMPARISON:  04/11/2019 FINDINGS: Cardiac shadow is stable. Aortic  calcifications are noted. Increased interstitial markings are seen particularly on the left likely related to underlying edema. No sizable effusion is seen. No bony abnormality is noted. IMPRESSION: Increasing interstitial changes likely related to underlying edema. Electronically Signed   By: Inez Catalina M.D.   On: 05/25/2019 10:46    Scheduled Meds:  carvedilol  12.5 mg Oral BID   Chlorhexidine Gluconate Cloth  6 each Topical Daily   cholecalciferol  1,000 Units Oral Daily   famotidine  20 mg Oral BID   FLUoxetine  20 mg Oral QHS   heparin  7,500 Units Subcutaneous Q8H   ipratropium  2 puff Inhalation Q8H   levalbuterol  2 puff Inhalation Q8H   methylPREDNISolone (SOLU-MEDROL) injection  60 mg Intravenous Q12H   nicotine  7 mg Transdermal QHS   pantoprazole  40 mg Oral Daily   polyethylene glycol  17 g Oral QHS   simvastatin  20 mg Oral QHS   sodium zirconium cyclosilicate  10 g Oral Daily   vitamin C  500 mg Oral Daily   zinc sulfate  220 mg Oral Daily    Continuous Infusions:  sodium chloride 50 mL/hr at 05/26/19 0527   calcium gluconate     remdesivir 100 mg in NS 250 mL       LOS: 1 day     Kayleen Memos, MD [facility] Pager [phone]  If 7PM-7AM, please contact night-coverage [email or web address] Password [facility] 05/26/2019, 8:43 AM

## 2019-05-26 NOTE — Progress Notes (Signed)
CRITICAL VALUE ALERT  Critical Value:  K+5.9  Date & Time Notied:  05/26/19 0730  Provider Notified: Dr. Nevada Crane  Orders Received/Actions taken: Awaiting new orders.

## 2019-05-27 DIAGNOSIS — E875 Hyperkalemia: Secondary | ICD-10-CM

## 2019-05-27 DIAGNOSIS — R0902 Hypoxemia: Secondary | ICD-10-CM

## 2019-05-27 LAB — COMPREHENSIVE METABOLIC PANEL
ALT: 13 U/L (ref 0–44)
AST: 13 U/L — ABNORMAL LOW (ref 15–41)
Albumin: 2.9 g/dL — ABNORMAL LOW (ref 3.5–5.0)
Alkaline Phosphatase: 68 U/L (ref 38–126)
Anion gap: 10 (ref 5–15)
BUN: 45 mg/dL — ABNORMAL HIGH (ref 8–23)
CO2: 23 mmol/L (ref 22–32)
Calcium: 8.5 mg/dL — ABNORMAL LOW (ref 8.9–10.3)
Chloride: 112 mmol/L — ABNORMAL HIGH (ref 98–111)
Creatinine, Ser: 1.68 mg/dL — ABNORMAL HIGH (ref 0.61–1.24)
GFR calc Af Amer: 46 mL/min — ABNORMAL LOW (ref >60)
GFR calc non Af Amer: 40 mL/min — ABNORMAL LOW (ref >60)
Glucose, Bld: 121 mg/dL — ABNORMAL HIGH (ref 70–99)
Potassium: 4.2 mmol/L (ref 3.5–5.1)
Sodium: 145 mmol/L (ref 135–145)
Total Bilirubin: 0.4 mg/dL (ref 0.3–1.2)
Total Protein: 6.4 g/dL — ABNORMAL LOW (ref 6.5–8.1)

## 2019-05-27 LAB — CBC WITH DIFFERENTIAL/PLATELET
Abs Immature Granulocytes: 0.23 10*3/uL — ABNORMAL HIGH (ref 0.00–0.07)
Basophils Absolute: 0 10*3/uL (ref 0.0–0.1)
Basophils Relative: 0 %
Eosinophils Absolute: 0 10*3/uL (ref 0.0–0.5)
Eosinophils Relative: 0 %
HCT: 44.8 % (ref 39.0–52.0)
Hemoglobin: 14.4 g/dL (ref 13.0–17.0)
Immature Granulocytes: 1 %
Lymphocytes Relative: 3 %
Lymphs Abs: 0.7 10*3/uL (ref 0.7–4.0)
MCH: 29.9 pg (ref 26.0–34.0)
MCHC: 32.1 g/dL (ref 30.0–36.0)
MCV: 92.9 fL (ref 80.0–100.0)
Monocytes Absolute: 0.5 10*3/uL (ref 0.1–1.0)
Monocytes Relative: 3 %
Neutro Abs: 18.5 10*3/uL — ABNORMAL HIGH (ref 1.7–7.7)
Neutrophils Relative %: 93 %
Platelets: 358 10*3/uL (ref 150–400)
RBC: 4.82 MIL/uL (ref 4.22–5.81)
RDW: 14.7 % (ref 11.5–15.5)
WBC: 20 10*3/uL — ABNORMAL HIGH (ref 4.0–10.5)
nRBC: 0 % (ref 0.0–0.2)

## 2019-05-27 LAB — FERRITIN: Ferritin: 158 ng/mL (ref 24–336)

## 2019-05-27 LAB — D-DIMER, QUANTITATIVE: D-Dimer, Quant: 2.88 ug/mL-FEU — ABNORMAL HIGH (ref 0.00–0.50)

## 2019-05-27 LAB — TRIGLYCERIDES: Triglycerides: 27 mg/dL (ref <150)

## 2019-05-27 LAB — MAGNESIUM: Magnesium: 2.3 mg/dL (ref 1.7–2.4)

## 2019-05-27 LAB — PHOSPHORUS: Phosphorus: 4.6 mg/dL (ref 2.5–4.6)

## 2019-05-27 LAB — C-REACTIVE PROTEIN: CRP: 8.7 mg/dL — ABNORMAL HIGH (ref <1.0)

## 2019-05-27 MED ORDER — HEPARIN SODIUM (PORCINE) 5000 UNIT/ML IJ SOLN
5000.0000 [IU] | Freq: Three times a day (TID) | INTRAMUSCULAR | Status: DC
  Administered 2019-05-27 – 2019-05-30 (×9): 5000 [IU] via SUBCUTANEOUS
  Filled 2019-05-27 (×9): qty 1

## 2019-05-27 MED ORDER — FENTANYL CITRATE (PF) 100 MCG/2ML IJ SOLN
50.0000 ug | Freq: Once | INTRAMUSCULAR | Status: AC
  Administered 2019-05-27: 50 ug via INTRAVENOUS
  Filled 2019-05-27: qty 2

## 2019-05-27 NOTE — Progress Notes (Signed)
Speech Therapy called to verify if working with pt today, pt remains NPO except sips/chips. Mailbox of number full, unable to leave VM will attempt to retry.

## 2019-05-27 NOTE — Progress Notes (Signed)
His d-dimer has trended down to <5 for 2 days now. Ok to reduce SQ heparin to standard dose per Dr Waldron Labs.  Onnie Boer, PharmD, BCIDP, AAHIVP, CPP Infectious Disease Pharmacist 05/27/2019 9:31 AM

## 2019-05-27 NOTE — Progress Notes (Signed)
Speech Therapy consulted w through chat, will see this pt this afternoon for evaluation.

## 2019-05-27 NOTE — Progress Notes (Signed)
  Speech Language Pathology Treatment: Dysphagia  Patient Details Name: Kristopher [name] MRN: [id number] DOB: 1946-08-03 Today's Date: 05/27/2019 Time: 1400-1420 SLP Time Calculation (min) (ACUTE ONLY): 20 min  Assessment / Plan / Recommendation Clinical Impression  Pt self-reports that he is swallowing better today, attributing a cup of ice to helping "clear the phlegm."  Pt's voice is strong; cough is weak.  He coughed minimally throughout session, not necessarily related to PO consumption.  He was observed to drink 6 oz of water, eat 4 oz of pudding and a cracker with active mastication (improved from yesterday from notes), palpable swallow swallow with two mild coughing episodes over the course of our session.  Most swallows were not followed by cough.  Pt with less expectoration this afternoon.  Chart notes indicate significant coughing episode with meds last night.  Recommend crushing meds and giving with applesauce; start a regular diet with thin liquids.  No comorbidities identified that would suggest dysphagia.  SLP to sign off.  Please contact us if issues do not resolve.(Call office number below or use secure chat text feature.)    HPI HPI: Patient is a 73 y.o. 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 He presented to hospital after 1 week h/o nausea, vomitting, diarrhea and was found to be hypoxic and COVID-19 POSITIVE. Transferred to [facility] facility on 05/26/19.       SLP Plan  All goals met       Recommendations  Diet recommendations: Regular;Thin liquid Liquids provided via: Cup;Straw Medication Administration: Whole meds with puree Supervision: Patient able to self feed Postural Changes and/or Swallow Maneuvers: Seated upright 90 degrees                 Oral Care Recommendations: Oral care BID Follow up Recommendations: None SLP Visit Diagnosis: Dysphagia, unspecified (R13.10) Plan: All goals met       GO                Kristopher [name] 05/27/2019, 2:24 PM  Seleste Tallman L. Tivis Ringer, [place] Office number [phone]

## 2019-05-27 NOTE — Progress Notes (Signed)
PROGRESS NOTE                                                                                                                                                                                                             Patient Demographics:    Kristopher [name], is a 73 y.o. male, DOB - Feb 10, 1946, [id number]  Admit date - 05/25/2019   Admitting Physician A Melven Sartorius., MD  Outpatient Primary MD for the patient is Alvester Chou, NP  LOS - 2   Chief Complaint  Patient presents with  . Shortness of Breath  . Weakness  . 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  As well found to be in renal failure, with a creatinine of 4, and he is with significant dysphagia as well, he was transferred to [facility] for further management.     Subjective:    Kristopher [name] today has, No headache, No chest pain, reports cough, dyspnea, no nausea or vomiting .    Assessment  & Plan :    Active Problems:   COVID-19 virus infection   AKI (acute kidney injury) ([place])   Acute hypoxic respiratory failure secondary to COVID-19 for pneumonia -Patient hypoxic 88% on presentation, this morning he was on 5 L nasal cannula, he was saturating around 85% on 3 L, he is back on 4 L nasal cannula currently. -Imaging significant for bilateral opacities. -Continue with IV steroids -Continue with Remdesivir 8/13. -Continue to trend inflammatory markers, D-dimers trending down which is reassuring, CRP trending down as well. -So far we will hold on Actemra and giving improvement respiratory status, and he is with known history of splenectomy. -Continue with zinc and vitamin C -Courage to use incentive spirometry and flutter valve - leukocytosis in the steroids,  afebrile, nontoxic-appearing   COVID-19 Labs  Recent Labs    05/25/19 1022 05/26/19 0105 05/27/19 0326  DDIMER 5.10* 4.51* 2.88*  FERRITIN 200 154 158  LDH 108  --   --   CRP 12.7* 11.7* 8.7*    Lab Results  Component Value Date   SARSCOV2NAA POSITIVE (A) 05/25/2019   AKI -In the setting of  solitary kidney, status post left nephrectomy(traumatic) -This is most likely  prerenal, improving with IV fluids, hold nephrotoxic medication -Renal failure improving, creatinine of 4 on admission, currently 1.68  Dysphagia -By SLP, he remains n.p.o. currently, they will reevaluate today  Hyperkalemia -The setting of renal failure, resolved  Bradycardia -Have stopped carvedilol  Hypertension/hypotension -Blood pressure on the lower side on admission, continue to hold Coreg and lisinopril  Protein calorie malnutrition -We will start on supplements once cleared by SLP  Elevated lipase Lipase on presentation 127, trended down to 43  History of splenectomy High risk for bacterial infection  Chronic anxiety/depression Continue home medications  Hyperlipidemia Continue statin No transaminitis Continue to monitor LFTs  Tobacco use disorder Tobacco cessation counseling at bedside. Nicotine patch as needed  Code Status : Full code  Family Communication  : Discussed with son Kristopher [name] per patient request [phone]  Disposition Plan  : Home  Barriers For Discharge : Remains on oxygen, fluids, Remdesivir and steroids  Consults  :  none  Procedures  : none  DVT Prophylaxis  : Subcu heparin  Lab Results  Component Value Date   PLT 358 05/27/2019    Antibiotics  :    Anti-infectives (From admission, onward)   Start     Dose/Rate Route Frequency Ordered Stop   05/26/19 1700  remdesivir 100 mg in sodium chloride 0.9 % 250 mL IVPB     100 mg 500 mL/hr over 30 Minutes Intravenous Every 24 hours 05/25/19 1520 05/30/19 1659   05/25/19 1700  remdesivir 200 mg in  sodium chloride 0.9 % 250 mL IVPB     200 mg 500 mL/hr over 30 Minutes Intravenous Once 05/25/19 1520 05/25/19 1725        Objective:   Vitals:   05/27/19 0500 05/27/19 0600 05/27/19 0805 05/27/19 1200  BP: (!) 104/44 (!) 123/47 (!) 109/52 (!) 113/50  Pulse: (!) 46 (!) 51 (!) 53 60  Resp: (!) 22 18 20 18   Temp:   (!) 96.8 F (36 C) (!) 97 F (36.1 C)  TempSrc:   Oral Axillary  SpO2: 95% 96% 94% 95%  Weight:  61.3 kg      Wt Readings from Last 3 Encounters:  05/27/19 61.3 kg  02/12/18 66.1 kg  04/17/17 63 kg     Intake/Output Summary (Last 24 hours) at 05/27/2019 1242 Last data filed at 05/27/2019 0600 Gross per 24 hour  Intake 1004.76 ml  Output 700 ml  Net 304.76 ml     Physical Exam  Awake Alert, Oriented X 3, frail, thin appearing male laying in bed in no apparent distress Symmetrical Chest wall movement, Good air movement bilaterally, scattered rales bilaterally RRR,No Gallops,Rubs or new Murmurs, No Parasternal Heave +ve B.Sounds, Abd Soft, No tenderness,  No rebound - guarding or rigidity. No Cyanosis, Clubbing or edema, No new Rash or bruise      Data Review:    CBC Recent Labs  Lab 05/25/19 1022 05/26/19 0105 05/27/19 0326  WBC 12.4* 9.3 20.0*  HGB 16.4 14.1 14.4  HCT 50.6 45.6 44.8  PLT 405* 419* 358  MCV 92.5 94.6 92.9  MCH 30.0 29.3 29.9  MCHC 32.4 30.9 32.1  RDW 14.6 14.6 14.7  LYMPHSABS 1.2 0.4* 0.7  MONOABS 1.0 0.1 0.5  EOSABS 0.1 0.0 0.0  BASOSABS 0.0 0.0 0.0    Chemistries  Recent Labs  Lab 05/25/19 1022 05/25/19 1821 05/25/19 1951  05/26/19 0105 05/26/19 0624 05/26/19 1430 05/26/19 1904 05/27/19 0326  NA 135 137 135  --  138  --   --   --  145  K 5.7* 6.4* 6.5*   < > 5.6* 5.9* 5.3* 5.0 4.2  CL 95* 103 104  --  109  --   --   --  112*  CO2 25 21* 21*  --  22  --   --   --  23  GLUCOSE 123* 65* 253*  --  121*  --   --   --  121*  BUN 69* 66* 63*  --  58*  --   --   --  45*  CREATININE 3.99* 3.41* 3.41*  --  2.69*  --    --   --  1.68*  CALCIUM 9.1 8.4* 7.9*  --  8.4*  --   --   --  8.5*  MG  --   --   --   --  2.6*  --   --   --  2.3  AST 24  --   --   --  17  --   --   --  13*  ALT 22  --   --   --  17  --   --   --  13  ALKPHOS 83  --   --   --  71  --   --   --  68  BILITOT 0.8  --   --   --  0.5  --   --   --  0.4   < > = values in this interval not displayed.   ------------------------------------------------------------------------------------------------------------------ Recent Labs    05/26/19 0105 05/27/19 0326  TRIG 42 27    No results found for: HGBA1C ------------------------------------------------------------------------------------------------------------------ No results for input(s): TSH, T4TOTAL, T3FREE, THYROIDAB in the last 72 hours.  Invalid input(s): FREET3 ------------------------------------------------------------------------------------------------------------------ Recent Labs    05/26/19 0105 05/27/19 0326  FERRITIN 154 158    Coagulation profile No results for input(s): INR, PROTIME in the last 168 hours.  Recent Labs    05/26/19 0105 05/27/19 0326  DDIMER 4.51* 2.88*    Cardiac Enzymes No results for input(s): CKMB, TROPONINI, MYOGLOBIN in the last 168 hours.  Invalid input(s): CK ------------------------------------------------------------------------------------------------------------------    Component Value Date/Time   BNP 33.4 05/25/2019 1022    Inpatient Medications  Scheduled Meds: . Chlorhexidine Gluconate Cloth  6 each Topical Daily  . cholecalciferol  1,000 Units Oral Daily  . FLUoxetine  20 mg Oral QHS  . heparin  5,000 Units Subcutaneous Q8H  . ipratropium  2 puff Inhalation Q8H  . levalbuterol  2 puff Inhalation Q8H  . methylPREDNISolone (SOLU-MEDROL) injection  60 mg Intravenous Q12H  . nicotine  7 mg Transdermal QHS  . pantoprazole (PROTONIX) IV  40 mg Intravenous Q24H  . polyethylene glycol  17 g Oral QHS  . simvastatin   20 mg Oral QHS  . vitamin C  500 mg Oral Daily  . zinc sulfate  220 mg Oral Daily   Continuous Infusions: . sodium chloride 50 mL/hr at 05/27/19 1211  . remdesivir 100 mg in NS 250 mL Stopped (05/26/19 1700)   PRN Meds:.acetaminophen **OR** acetaminophen, ALPRAZolam, chlorpheniramine-HYDROcodone, guaiFENesin-dextromethorphan, HYDROmorphone (DILAUDID) injection, nicotine polacrilex, ondansetron (ZOFRAN) IV, oxyCODONE  Micro Results Recent Results (from the past 240 hour(s))  Blood Culture (routine x 2)     Status: None (Preliminary result)   Collection Time: 05/25/19 10:22 AM   Specimen: BLOOD  Result Value Ref Range Status  Specimen Description   Final    BLOOD LEFT ANTECUBITAL Performed at [facility] [address], [place], [place] [address]    Special Requests   Final    BOTTLES DRAWN AEROBIC AND ANAEROBIC Blood Culture adequate volume Performed at [facility] [address], [place], [place] [address]    Culture   Final    NO GROWTH 2 DAYS Performed at [facility] [address], [place], [place] [address]    Report Status PENDING  Incomplete  Blood Culture (routine x 2)     Status: None (Preliminary result)   Collection Time: 05/25/19 10:22 AM   Specimen: BLOOD  Result Value Ref Range Status   Specimen Description   Final    BLOOD BLOOD LEFT FOREARM Performed at [facility] [address] [place], [place] [address]    Special Requests   Final    BOTTLES DRAWN AEROBIC AND ANAEROBIC Blood Culture adequate volume Performed at [facility] [address] [place], [place] [address]    Culture   Final    NO GROWTH 2 DAYS Performed at [facility] [address] [place], [place] [address]    Report Status PENDING  Incomplete  SARS Coronavirus 2 [facility] order, Performed in [facility] hospital lab) Nasopharyngeal Nasopharyngeal Swab     Status: Abnormal   Collection  Time: 05/25/19 10:58 AM   Specimen: Nasopharyngeal Swab  Result Value Ref Range Status   SARS Coronavirus 2 POSITIVE (A) NEGATIVE Final    Comment: RESULT CALLED TO, READ BACK BY AND VERIFIED WITH: TIM SMITH,RN [id number] @ [id number] BY J SCOTTON (NOTE) If result is NEGATIVE SARS-CoV-2 target nucleic acids are NOT DETECTED. The SARS-CoV-2 RNA is generally detectable in upper and lower  respiratory specimens during the acute phase of infection. The lowest  concentration of SARS-CoV-2 viral copies this assay can detect is 250  copies / mL. A negative result does not preclude SARS-CoV-2 infection  and should not be used as the sole basis for treatment or other  patient management decisions.  A negative result may occur with  improper specimen collection / handling, submission of specimen other  than nasopharyngeal swab, presence of viral mutation(s) within the  areas targeted by this assay, and inadequate number of viral copies  (<250 copies / mL). A negative result must be combined with clinical  observations, patient history, and epidemiological information. If result is POSITIVE SARS-CoV-2 target nucleic acids are DETECTED . The SARS-CoV-2 RNA is generally detectable in upper and lower  respiratory specimens during the acute phase of infection.  Positive  results are indicative of active infection with SARS-CoV-2.  Clinical  correlation with patient history and other diagnostic information is  necessary to determine patient infection status.  Positive results do  not rule out bacterial infection or co-infection with other viruses. If result is PRESUMPTIVE POSTIVE SARS-CoV-2 nucleic acids MAY BE PRESENT.   A presumptive positive result was obtained on the submitted specimen  and confirmed on repeat testing.  While 2019 novel coronavirus  (SARS-CoV-2) nucleic acids may be present in the submitted sample  additional confirmatory testing may be necessary for epidemiological  and / or clinical  management purposes  to differentiate between  SARS-CoV-2 and other Sarbecovirus currently known to infect humans.  If clinically indicated additional testing with an alternate test  methodology [phone] ) is advised. The SARS-CoV-2 RNA is generally  detectable in upper and lower  respiratory specimens during the acute  phase of infection. The expected result is Negative. Fact Sheet for Patients:  StrictlyIdeas.no Fact Sheet for Healthcare Providers: BankingDealers.co.za This test is not yet approved or cleared by the [place] FDA and has been authorized for detection and/or diagnosis of SARS-CoV-2 by FDA under an Emergency Use Authorization (EUA).  This EUA will remain in effect (meaning this test can be used) for the duration of the COVID-19 declaration under Section 564(b)(1) of the Act, 21 U.S.C. section 360bbb-3(b)(1), unless the authorization is terminated or revoked sooner. Performed at [facility], [place] [address], [place], [place] [address]   Urine culture     Status: None   Collection Time: 05/25/19 10:52 PM   Specimen: Urine, Random  Result Value Ref Range Status   Specimen Description   Final    URINE, RANDOM Performed at [facility] [address] [place], [place] [address]    Special Requests   Final    NONE Performed at [facility], [place] [address] [place], [place] [address]    Culture   Final    NO GROWTH Performed at [facility], [place] [address] [place], [place] [address]    Report Status 05/26/2019 FINAL  Final  Expectorated sputum assessment w rflx to resp cult     Status: None   Collection Time: 05/26/19  1:07 AM   Specimen: Sputum  Result Value Ref Range Status   Specimen Description SPUTUM  Final   Special Requests NONE  Final   Sputum evaluation   Final    THIS SPECIMEN IS ACCEPTABLE FOR SPUTUM CULTURE Performed at [facility], [place] [address], [place], [place] [address]    Report Status 05/26/2019 FINAL  Final  Culture, respiratory     Status: None (Preliminary result)   Collection Time: 05/26/19  1:07 AM   Specimen: SPU  Result Value Ref Range Status   Specimen Description   Final    SPUTUM Performed at [facility] [address], [place], [place] [address]    Special Requests   Final    NONE Reflexed from [id number] Performed at [facility], [facility] [employer] [facility] [place] [address], [place], [place] [address]    Gram Stain   Final    RARE WBC PRESENT, PREDOMINANTLY PMN FEW GRAM NEGATIVE RODS FEW GRAM POSITIVE COCCI IN CLUSTERS FEW GRAM NEGATIVE COCCOBACILLI    Culture   Final    CULTURE REINCUBATED FOR BETTER GROWTH Performed at [facility], [place] [address] [place],  [address]    Report Status PENDING  Incomplete    Radiology Reports Ct Abdomen Pelvis Wo Contrast  Result Date: 05/25/2019 CLINICAL DATA:  73 year old male with history of smoking presenting with nausea vomiting and diarrhea. Patient was found to be hypoxic and COVID-19 positive. EXAM: CT ABDOMEN AND PELVIS WITHOUT CONTRAST TECHNIQUE: Multidetector CT imaging of the abdomen and pelvis was performed following the standard protocol without IV contrast. COMPARISON:  CT of the abdomen pelvis dated 08/21/2012. Chest radiograph dated 05/25/2019 FINDINGS: Evaluation of this exam is limited in the absence of intravenous contrast. Evaluation is also limited due to streak artifact caused by patient's arms as well as respiratory motion artifact. Lower chest: There is emphysematous changes of the lungs. Left lung base densities, new since the study of 2013 and concerning for superimposed infectious process. Clinical correlation is recommended. There is multi vessel coronary vascular calcification. No intra-abdominal free  air or free fluid. Hepatobiliary: The liver is unremarkable. No intrahepatic biliary ductal  dilatation. Cholecystectomy. Pancreas: Unremarkable. No pancreatic ductal dilatation or surrounding inflammatory changes. Spleen: Status post prior splenectomy with multiple residual splenic tissue. Adrenals/Urinary Tract: The adrenal glands are unremarkable. Postsurgical changes of prior left nephrectomy. Several right renal lesions which are not characterized on this unenhanced CT. The largest lesion in the upper pole of the right kidney measures up to 3.3 cm and demonstrates a fluid attenuation most consistent with a cyst. Several smaller high attenuating lesions are not characterized but may represent complex/hemorrhagic cysts. Ultrasound may provide better characterization. There is no hydronephrosis or nephrolithiasis. Renal vascular calcifications noted. The right ureter and urinary bladder appear unremarkable. Stomach/Bowel: There is no bowel obstruction or active inflammation. The appendix is not visualized with certainty. No inflammatory changes identified in the right lower quadrant. Vascular/Lymphatic: There is advanced atherosclerotic calcification of the aorta and iliac arteries. The IVC is unremarkable. No portal venous gas. There is no adenopathy. Reproductive: Mildly enlarged prostate gland measuring approximately 4.3 cm in transverse axial diameter. Other: Midline vertical anterior pelvic wall incisional scar. There is a small fat containing ventral hernia the left of the midline. The neck of the hernia defect measures approximately 17 mm in transverse axial diameter. There is small amount of fluid and mild stranding of the herniated fat. Correlation with clinical exam is recommended to exclude strangulation or incarceration. No herniated bowel identified. Musculoskeletal: Advanced osteopenia. Multilevel degenerative changes of the spine with enthesopathy and findings consistent with ankylosing spondylitis. No acute fracture. IMPRESSION: 1. Increased left lung base densities concerning for  developing infiltrate. Clinical correlation is recommended. 2. No acute intra-abdominal or pelvic pathology. No bowel obstruction or active inflammation. 3. Small fat containing ventral hernia with mild stranding of the herniated fat. Correlation with clinical exam is recommended to exclude strangulation or incarceration. No herniated bowel. 4. Aortic Atherosclerosis (ICD10-I70.0) and Emphysema (ICD10-J43.9). Electronically Signed   By: Anner Crete M.D.   On: 05/25/2019 21:43   Dg Chest Port 1 View  Result Date: 05/26/2019 CLINICAL DATA:  Hypoxia EXAM: PORTABLE CHEST 1 VIEW COMPARISON:  CT 04/12/2014, radiograph 05/25/2019. FINDINGS: Worsening interstitial opacities throughout both lungs, favored to reflect edema on a background of emphysematous change seen on prior CT. Apical pleuroparenchymal scarring is present. Abundant pericardial fat pad in the left lung base. The heart appears slightly enlarged from prior portable technique as do the central pulmonary arteries. Surgical clips are present in the left upper quadrant. Bones are diffusely demineralized. No acute osseous or soft tissue abnormality. IMPRESSION: Worsening interstitial opacities throughout both lungs, favored to reflect worsening edema on a background of emphysematous change seen on prior CT. Electronically Signed   By: Lovena Le M.D.   On: 05/26/2019 14:33   Dg Chest Port 1 View  Result Date: 05/25/2019 CLINICAL DATA:  Increased shortness of breath EXAM: PORTABLE CHEST 1 VIEW COMPARISON:  04/11/2019 FINDINGS: Cardiac shadow is stable. Aortic calcifications are noted. Increased interstitial markings are seen particularly on the left likely related to underlying edema. No sizable effusion is seen. No bony abnormality is noted. IMPRESSION: Increasing interstitial changes likely related to underlying edema. Electronically Signed   By: Inez Catalina M.D.   On: 05/25/2019 10:46    Phillips Climes M.D on 05/27/2019 at 12:42 PM  Between  7am to 7pm - Pager - [phone]  After 7pm go to [email or web address] - password [facility]  [facility] -  Office  [phone]

## 2019-05-27 NOTE — Plan of Care (Signed)
Continue with POC, awaiting update/recommendation from Speech Therapy regarding NPO status.

## 2019-05-28 LAB — COMPREHENSIVE METABOLIC PANEL
ALT: 14 U/L (ref 0–44)
AST: 16 U/L (ref 15–41)
Albumin: 2.8 g/dL — ABNORMAL LOW (ref 3.5–5.0)
Alkaline Phosphatase: 66 U/L (ref 38–126)
Anion gap: 6 (ref 5–15)
BUN: 51 mg/dL — ABNORMAL HIGH (ref 8–23)
CO2: 25 mmol/L (ref 22–32)
Calcium: 8.3 mg/dL — ABNORMAL LOW (ref 8.9–10.3)
Chloride: 112 mmol/L — ABNORMAL HIGH (ref 98–111)
Creatinine, Ser: 1.37 mg/dL — ABNORMAL HIGH (ref 0.61–1.24)
GFR calc Af Amer: 59 mL/min — ABNORMAL LOW (ref >60)
GFR calc non Af Amer: 51 mL/min — ABNORMAL LOW (ref >60)
Glucose, Bld: 157 mg/dL — ABNORMAL HIGH (ref 70–99)
Potassium: 4.2 mmol/L (ref 3.5–5.1)
Sodium: 143 mmol/L (ref 135–145)
Total Bilirubin: 0.4 mg/dL (ref 0.3–1.2)
Total Protein: 6.2 g/dL — ABNORMAL LOW (ref 6.5–8.1)

## 2019-05-28 LAB — CBC WITH DIFFERENTIAL/PLATELET
Abs Immature Granulocytes: 0.15 10*3/uL — ABNORMAL HIGH (ref 0.00–0.07)
Basophils Absolute: 0 10*3/uL (ref 0.0–0.1)
Basophils Relative: 0 %
Eosinophils Absolute: 0 10*3/uL (ref 0.0–0.5)
Eosinophils Relative: 0 %
HCT: 43.6 % (ref 39.0–52.0)
Hemoglobin: 14.1 g/dL (ref 13.0–17.0)
Immature Granulocytes: 1 %
Lymphocytes Relative: 2 %
Lymphs Abs: 0.4 10*3/uL — ABNORMAL LOW (ref 0.7–4.0)
MCH: 29.8 pg (ref 26.0–34.0)
MCHC: 32.3 g/dL (ref 30.0–36.0)
MCV: 92.2 fL (ref 80.0–100.0)
Monocytes Absolute: 0.8 10*3/uL (ref 0.1–1.0)
Monocytes Relative: 5 %
Neutro Abs: 15.4 10*3/uL — ABNORMAL HIGH (ref 1.7–7.7)
Neutrophils Relative %: 92 %
Platelets: 344 10*3/uL (ref 150–400)
RBC: 4.73 MIL/uL (ref 4.22–5.81)
RDW: 14.9 % (ref 11.5–15.5)
WBC: 16.8 10*3/uL — ABNORMAL HIGH (ref 4.0–10.5)
nRBC: 0 % (ref 0.0–0.2)

## 2019-05-28 LAB — D-DIMER, QUANTITATIVE: D-Dimer, Quant: 1.78 ug/mL-FEU — ABNORMAL HIGH (ref 0.00–0.50)

## 2019-05-28 LAB — CULTURE, RESPIRATORY W GRAM STAIN

## 2019-05-28 LAB — FERRITIN: Ferritin: 168 ng/mL (ref 24–336)

## 2019-05-28 LAB — C-REACTIVE PROTEIN: CRP: 6.6 mg/dL — ABNORMAL HIGH (ref <1.0)

## 2019-05-28 LAB — TRIGLYCERIDES: Triglycerides: 60 mg/dL (ref <150)

## 2019-05-28 LAB — PHOSPHORUS: Phosphorus: 2.6 mg/dL (ref 2.5–4.6)

## 2019-05-28 LAB — MAGNESIUM: Magnesium: 2.2 mg/dL (ref 1.7–2.4)

## 2019-05-28 MED ORDER — LORATADINE 10 MG PO TABS
10.0000 mg | ORAL_TABLET | Freq: Every day | ORAL | Status: DC
  Administered 2019-05-28 – 2019-06-03 (×7): 10 mg via ORAL
  Filled 2019-05-28 (×7): qty 1

## 2019-05-28 MED ORDER — TAMSULOSIN HCL 0.4 MG PO CAPS
0.4000 mg | ORAL_CAPSULE | Freq: Every day | ORAL | Status: DC
  Administered 2019-05-28 – 2019-06-02 (×6): 0.4 mg via ORAL
  Filled 2019-05-28 (×6): qty 1

## 2019-05-28 MED ORDER — ENSURE ENLIVE PO LIQD
237.0000 mL | Freq: Two times a day (BID) | ORAL | Status: DC
  Administered 2019-05-28 – 2019-06-03 (×12): 237 mL via ORAL

## 2019-05-28 MED ORDER — GUAIFENESIN ER 600 MG PO TB12
1200.0000 mg | ORAL_TABLET | Freq: Two times a day (BID) | ORAL | Status: DC
  Administered 2019-05-28 – 2019-06-03 (×13): 1200 mg via ORAL
  Filled 2019-05-28 (×14): qty 2

## 2019-05-28 NOTE — Plan of Care (Addendum)
Continue with POC, pt up in chair majority of shift, has been educated with verbal/teach back on IS and Acapella. 02 probe changed to Left index finger and saturation regarding more accurate. Requires 4L 02 [place] sat range 88-98. Continues to have loose secretions and utilized yankour suction @ times. Pt started on Flomax per MD order and Foley cath to be removed this evening when pt returns to bed from chair per MD.

## 2019-05-28 NOTE — Progress Notes (Signed)
PROGRESS NOTE                                                                                                                                                                                                             Patient Demographics:    Kristopher [name], is a 73 y.o. male, DOB - 08-Apr-1946, [id number]  Admit date - 05/25/2019   Admitting Physician A Melven Sartorius., MD  Outpatient Primary MD for the patient is Alvester Chou, NP  LOS - 3   Chief Complaint  Patient presents with  . Shortness of Breath  . Weakness  . 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  As well found to be in renal failure, with a creatinine of 4, and he is with significant dysphagia as well, he was transferred to [facility] for further management.     Subjective:    Kristopher [name] today has, No headache, No chest pain, reports cough, with productive phlegm, has been improving .   Assessment  & Plan :    Active Problems:   COVID-19 virus infection   AKI (acute kidney injury) ([place])   Acute hypoxic respiratory failure secondary to COVID-19 for pneumonia -Patient hypoxic 88% on presentation, going hypoxic 85% on 3 L nasal cannula this morning, I have increased him back to 5 L nasal cannula . -He was encouraged to use incentive spirometry and flutter valve -Imaging significant for bilateral opacities.  As well hypoxic, started on IV Remdesivir 8/13 -Continue with IV steroids -Continue to trend inflammatory markers, D-dimers and CRP trending down -So far we will hold on Actemra and giving improvement respiratory status, and he is with known history of splenectomy. -Continue with zinc and vitamin C -Courage to use incentive spirometry and flutter valve -  leukocytosis in the steroids, afebrile, nontoxic-appearing   COVID-19 Labs  Recent Labs    05/26/19 0105 05/27/19 0326 05/28/19 0140  DDIMER 4.51* 2.88* 1.78*  FERRITIN 154 158 168  CRP 11.7* 8.7* 6.6*    Lab Results  Component Value Date   SARSCOV2NAA POSITIVE (A) 05/25/2019   AKI -In the setting of solitary kidney, status post left  nephrectomy(traumatic) -This is most likely  prerenal, improving with IV fluids, hold nephrotoxic medication -Renal failure improving, creatinine of 4 on admission, currently 1.37, will discontinue IV fluids  Dysphagia -Seen by SLP  Hyperkalemia -The setting of renal failure, resolved  Bradycardia -Have stopped carvedilol -EKG withLBBB, peers to be old, denies any chest pain  Hypertension/hypotension -Blood pressure on the lower side on admission, continue to hold Coreg and lisinopril  Protein calorie malnutrition -We will start on supplements once cleared by SLP  Elevated lipase Lipase on presentation 127, trended down to 43  History of splenectomy High risk for bacterial infection  Chronic anxiety/depression Continue home medications  Hyperlipidemia Continue statin No transaminitis Continue to monitor LFTs  Tobacco use disorder Tobacco cessation counseling at bedside. Nicotine patch as needed  Code Status : Full code  Family Communication  : Discussed with son Cristie Hem per patient request [phone] on 8/15  Disposition Plan  : Home  Barriers For Discharge : Remains on oxygen, fluids, Remdesivir and steroids  Consults  :  none  Procedures  : none  DVT Prophylaxis  : Subcu heparin  Lab Results  Component Value Date   PLT 344 05/28/2019    Antibiotics  :    Anti-infectives (From admission, onward)   Start     Dose/Rate Route Frequency Ordered Stop   05/26/19 1700  remdesivir 100 mg in sodium chloride 0.9 % 250 mL IVPB     100 mg 500 mL/hr over 30 Minutes Intravenous Every 24 hours 05/25/19 1520  05/30/19 1659   05/25/19 1700  remdesivir 200 mg in sodium chloride 0.9 % 250 mL IVPB     200 mg 500 mL/hr over 30 Minutes Intravenous Once 05/25/19 1520 05/25/19 1725        Objective:   Vitals:   05/27/19 1906 05/28/19 0400 05/28/19 0803 05/28/19 1224  BP: 137/63 (!) 123/49 (!) 145/84 138/66  Pulse: 61 (!) 48 72 (!) 58  Resp: 19 (!) 23 20 20   Temp: 98.1 F (36.7 C) 97.6 F (36.4 C) 97.9 F (36.6 C) (!) 97.5 F (36.4 C)  TempSrc: Oral Oral Oral Oral  SpO2: 97% 97% 91% 96%  Weight:        Wt Readings from Last 3 Encounters:  05/27/19 61.3 kg  02/12/18 66.1 kg  04/17/17 63 kg     Intake/Output Summary (Last 24 hours) at 05/28/2019 1340 Last data filed at 05/28/2019 1100 Gross per 24 hour  Intake 840 ml  Output 650 ml  Net 190 ml     Physical Exam  Awake Alert, Oriented X 3, No new F.N deficits, Normal affect, thin appearing, chronically ill-appearing male Symmetrical Chest wall movement, Good air movement bilaterally, added rales bilaterally RRR,No Gallops,Rubs or new Murmurs, No Parasternal Heave +ve B.Sounds, Abd Soft, No tenderness, No rebound - guarding or rigidity. No Cyanosis, Clubbing or edema, No new Rash or bruise      Data Review:    CBC Recent Labs  Lab 05/25/19 1022 05/26/19 0105 05/27/19 0326 05/28/19 0140  WBC 12.4* 9.3 20.0* 16.8*  HGB 16.4 14.1 14.4 14.1  HCT 50.6 45.6 44.8 43.6  PLT 405* 419* 358 344  MCV 92.5 94.6 92.9 92.2  MCH 30.0 29.3 29.9 29.8  MCHC 32.4 30.9 32.1 32.3  RDW 14.6 14.6 14.7 14.9  LYMPHSABS 1.2 0.4* 0.7 0.4*  MONOABS 1.0 0.1 0.5 0.8  EOSABS 0.1 0.0 0.0 0.0  BASOSABS 0.0 0.0 0.0 0.0    Chemistries  Recent Labs  Lab 05/25/19 1022 05/25/19 1821 05/25/19 1951  05/26/19 0105 05/26/19 0624 05/26/19 1430 05/26/19 1904 05/27/19 0326 05/28/19 0140  NA 135 137 135  --  138  --   --   --  145 143  K 5.7* 6.4* 6.5*   < > 5.6* 5.9* 5.3* 5.0 4.2 4.2  CL 95* 103 104  --  109  --   --   --  112* 112*  CO2 25 21*  21*  --  22  --   --   --  23 25  GLUCOSE 123* 65* 253*  --  121*  --   --   --  121* 157*  BUN 69* 66* 63*  --  58*  --   --   --  45* 51*  CREATININE 3.99* 3.41* 3.41*  --  2.69*  --   --   --  1.68* 1.37*  CALCIUM 9.1 8.4* 7.9*  --  8.4*  --   --   --  8.5* 8.3*  MG  --   --   --   --  2.6*  --   --   --  2.3 2.2  AST 24  --   --   --  17  --   --   --  13* 16  ALT 22  --   --   --  17  --   --   --  13 14  ALKPHOS 83  --   --   --  71  --   --   --  68 66  BILITOT 0.8  --   --   --  0.5  --   --   --  0.4 0.4   < > = values in this interval not displayed.   ------------------------------------------------------------------------------------------------------------------ Recent Labs    05/27/19 0326 05/28/19 0140  TRIG 27 60    No results found for: HGBA1C ------------------------------------------------------------------------------------------------------------------ No results for input(s): TSH, T4TOTAL, T3FREE, THYROIDAB in the last 72 hours.  Invalid input(s): FREET3 ------------------------------------------------------------------------------------------------------------------ Recent Labs    05/27/19 0326 05/28/19 0140  FERRITIN 158 168    Coagulation profile No results for input(s): INR, PROTIME in the last 168 hours.  Recent Labs    05/27/19 0326 05/28/19 0140  DDIMER 2.88* 1.78*    Cardiac Enzymes No results for input(s): CKMB, TROPONINI, MYOGLOBIN in the last 168 hours.  Invalid input(s): CK ------------------------------------------------------------------------------------------------------------------    Component Value Date/Time   BNP 33.4 05/25/2019 1022    Inpatient Medications  Scheduled Meds: . Chlorhexidine Gluconate Cloth  6 each Topical Daily  . cholecalciferol  1,000 Units Oral Daily  . feeding supplement (ENSURE ENLIVE)  237 mL Oral BID BM  . FLUoxetine  20 mg Oral QHS  . heparin  5,000 Units Subcutaneous Q8H  . ipratropium  2  puff Inhalation Q8H  . levalbuterol  2 puff Inhalation Q8H  . methylPREDNISolone (SOLU-MEDROL) injection  60 mg Intravenous Q12H  . nicotine  7 mg Transdermal QHS  . pantoprazole (PROTONIX) IV  40 mg Intravenous Q24H  . polyethylene glycol  17 g Oral QHS  . simvastatin  20 mg Oral QHS  . vitamin C  500 mg Oral Daily  . zinc sulfate  220 mg Oral Daily   Continuous Infusions: . sodium chloride 50 mL/hr at 05/27/19 1211  . remdesivir 100 mg in NS 250 mL 100 mg (05/27/19 1702)   PRN Meds:.acetaminophen **OR** acetaminophen, ALPRAZolam, chlorpheniramine-HYDROcodone, guaiFENesin-dextromethorphan, nicotine polacrilex, ondansetron (ZOFRAN)  IV, oxyCODONE  Micro Results Recent Results (from the past 240 hour(s))  Blood Culture (routine x 2)     Status: None (Preliminary result)   Collection Time: 05/25/19 10:22 AM   Specimen: BLOOD  Result Value Ref Range Status   Specimen Description   Final    BLOOD LEFT ANTECUBITAL Performed at [facility] [address] [place], [place] [address]    Special Requests   Final    BOTTLES DRAWN AEROBIC AND ANAEROBIC Blood Culture adequate volume Performed at [facility] [address] [place], [place] [address]    Culture   Final    NO GROWTH 3 DAYS Performed at [facility], [place] [address], [place], [place] [address]    Report Status PENDING  Incomplete  Blood Culture (routine x 2)     Status: None (Preliminary result)   Collection Time: 05/25/19 10:22 AM   Specimen: BLOOD  Result Value Ref Range Status   Specimen Description   Final    BLOOD BLOOD LEFT FOREARM Performed at [facility] [address], [place], [place] [address]    Special Requests   Final    BOTTLES DRAWN AEROBIC AND ANAEROBIC Blood Culture adequate volume Performed at [facility] [address], [place], [place] [address]    Culture   Final    NO GROWTH 3 DAYS Performed at [facility], [place] [address] [place], [place] [address]    Report Status PENDING  Incomplete  SARS Coronavirus 2 [facility] order, Performed in [facility] hospital lab) Nasopharyngeal Nasopharyngeal Swab     Status: Abnormal   Collection Time: 05/25/19 10:58 AM   Specimen: Nasopharyngeal Swab  Result Value Ref Range Status   SARS Coronavirus 2 POSITIVE (A) NEGATIVE Final    Comment: RESULT CALLED TO, READ BACK BY AND VERIFIED WITH: TIM SMITH,RN IN:[id number] @ [id number] BY J SCOTTON (NOTE) If result is NEGATIVE SARS-CoV-2 target nucleic acids are NOT DETECTED. The SARS-CoV-2 RNA is generally detectable in upper and lower  respiratory specimens during the acute phase of infection. The lowest  concentration of SARS-CoV-2 viral copies this assay can detect is 250  copies / mL. A negative result does not preclude SARS-CoV-2 infection  and should not be used as the sole basis for treatment or other  patient management decisions.  A negative result may occur with  improper specimen collection / handling, submission of specimen other  than nasopharyngeal swab, presence of viral mutation(s) within the  areas targeted by this assay, and inadequate number of viral copies  (<250 copies / mL). A negative result must be combined with clinical  observations, patient history, and epidemiological information. If result is POSITIVE SARS-CoV-2 target nucleic acids are DETECTED . The SARS-CoV-2 RNA is generally detectable in upper and lower  respiratory specimens during the acute phase of infection.  Positive  results are indicative of active infection with SARS-CoV-2.  Clinical  correlation with patient history and other diagnostic information is  necessary to determine patient infection status.  Positive results do  not rule out bacterial infection or co-infection with other viruses. If result is PRESUMPTIVE POSTIVE SARS-CoV-2 nucleic acids MAY BE PRESENT.   A presumptive positive result was obtained on the  submitted specimen  and confirmed on repeat testing.  While 2019 novel coronavirus  (SARS-CoV-2) nucleic acids may be present in the submitted sample  additional confirmatory testing may be necessary for epidemiological  and /  or clinical management purposes  to differentiate between  SARS-CoV-2 and other Sarbecovirus currently known to infect humans.  If clinically indicated additional testing with an alternate test  methodology [phone] ) is advised. The SARS-CoV-2 RNA is generally  detectable in upper and lower respiratory specimens during the acute  phase of infection. The expected result is Negative. Fact Sheet for Patients:  StrictlyIdeas.no Fact Sheet for Healthcare Providers: BankingDealers.co.za This test is not yet approved or cleared by the [place] FDA and has been authorized for detection and/or diagnosis of SARS-CoV-2 by FDA under an Emergency Use Authorization (EUA).  This EUA will remain in effect (meaning this test can be used) for the duration of the COVID-19 declaration under Section 564(b)(1) of the Act, 21 U.S.C. section 360bbb-3(b)(1), unless the authorization is terminated or revoked sooner. Performed at [facility], [place] [address] [place], [place] [address]   Urine culture     Status: None   Collection Time: 05/25/19 10:52 PM   Specimen: Urine, Random  Result Value Ref Range Status   Specimen Description   Final    URINE, RANDOM Performed at [facility] [address], [place], [place] [address]    Special Requests   Final    NONE Performed at [facility], [place] [address] [place], [place] [address]    Culture   Final    NO GROWTH Performed at [facility], [place] [address] [place], [place] [address]    Report Status 05/26/2019 FINAL  Final  Expectorated sputum assessment w rflx to resp cult     Status: None   Collection Time: 05/26/19   1:07 AM   Specimen: Sputum  Result Value Ref Range Status   Specimen Description SPUTUM  Final   Special Requests NONE  Final   Sputum evaluation   Final    THIS SPECIMEN IS ACCEPTABLE FOR SPUTUM CULTURE Performed at [facility], [place] [address] [place], [place] [address]    Report Status 05/26/2019 FINAL  Final  Culture, respiratory     Status: None   Collection Time: 05/26/19  1:07 AM   Specimen: SPU  Result Value Ref Range Status   Specimen Description   Final    SPUTUM Performed at [facility] [address], [place], [place] [address]    Special Requests   Final    NONE Reflexed from [phone] Performed at [facility], [place] [address] [place], [place] [address]    Gram Stain   Final    RARE WBC PRESENT, PREDOMINANTLY PMN FEW GRAM NEGATIVE RODS FEW GRAM POSITIVE COCCI IN CLUSTERS FEW GRAM NEGATIVE COCCOBACILLI    Culture   Final    MODERATE HAEMOPHILUS INFLUENZAE BETA LACTAMASE POSITIVE Performed at [facility], [address] [place], [place] [address]    Report Status 05/28/2019 FINAL  Final    Radiology Reports Ct Abdomen Pelvis Wo Contrast  Result Date: 05/25/2019 CLINICAL DATA:  73 year old male with history of smoking presenting with nausea vomiting and diarrhea. Patient was found to be hypoxic and COVID-19 positive. EXAM: CT ABDOMEN AND PELVIS WITHOUT CONTRAST TECHNIQUE: Multidetector CT imaging of the abdomen and pelvis was performed following the standard protocol without IV contrast. COMPARISON:  CT of the abdomen pelvis dated 08/21/2012. Chest radiograph dated 05/25/2019 FINDINGS: Evaluation of this exam is limited in the absence of intravenous contrast. Evaluation is also limited due to streak artifact caused by patient's arms  as well as respiratory motion artifact. Lower chest: There is emphysematous changes of the lungs. Left lung base densities, new since the study of 2013 and concerning for  superimposed infectious process. Clinical correlation is recommended. There is multi vessel coronary vascular calcification. No intra-abdominal free air or free fluid. Hepatobiliary: The liver is unremarkable. No intrahepatic biliary ductal dilatation. Cholecystectomy. Pancreas: Unremarkable. No pancreatic ductal dilatation or surrounding inflammatory changes. Spleen: Status post prior splenectomy with multiple residual splenic tissue. Adrenals/Urinary Tract: The adrenal glands are unremarkable. Postsurgical changes of prior left nephrectomy. Several right renal lesions which are not characterized on this unenhanced CT. The largest lesion in the upper pole of the right kidney measures up to 3.3 cm and demonstrates a fluid attenuation most consistent with a cyst. Several smaller high attenuating lesions are not characterized but may represent complex/hemorrhagic cysts. Ultrasound may provide better characterization. There is no hydronephrosis or nephrolithiasis. Renal vascular calcifications noted. The right ureter and urinary bladder appear unremarkable. Stomach/Bowel: There is no bowel obstruction or active inflammation. The appendix is not visualized with certainty. No inflammatory changes identified in the right lower quadrant. Vascular/Lymphatic: There is advanced atherosclerotic calcification of the aorta and iliac arteries. The IVC is unremarkable. No portal venous gas. There is no adenopathy. Reproductive: Mildly enlarged prostate gland measuring approximately 4.3 cm in transverse axial diameter. Other: Midline vertical anterior pelvic wall incisional scar. There is a small fat containing ventral hernia the left of the midline. The neck of the hernia defect measures approximately 17 mm in transverse axial diameter. There is small amount of fluid and mild stranding of the herniated fat. Correlation with clinical exam is recommended to exclude strangulation or incarceration. No herniated bowel identified.  Musculoskeletal: Advanced osteopenia. Multilevel degenerative changes of the spine with enthesopathy and findings consistent with ankylosing spondylitis. No acute fracture. IMPRESSION: 1. Increased left lung base densities concerning for developing infiltrate. Clinical correlation is recommended. 2. No acute intra-abdominal or pelvic pathology. No bowel obstruction or active inflammation. 3. Small fat containing ventral hernia with mild stranding of the herniated fat. Correlation with clinical exam is recommended to exclude strangulation or incarceration. No herniated bowel. 4. Aortic Atherosclerosis (ICD10-I70.0) and Emphysema (ICD10-J43.9). Electronically Signed   By: Anner Crete M.D.   On: 05/25/2019 21:43   Dg Chest Port 1 View  Result Date: 05/26/2019 CLINICAL DATA:  Hypoxia EXAM: PORTABLE CHEST 1 VIEW COMPARISON:  CT 04/12/2014, radiograph 05/25/2019. FINDINGS: Worsening interstitial opacities throughout both lungs, favored to reflect edema on a background of emphysematous change seen on prior CT. Apical pleuroparenchymal scarring is present. Abundant pericardial fat pad in the left lung base. The heart appears slightly enlarged from prior portable technique as do the central pulmonary arteries. Surgical clips are present in the left upper quadrant. Bones are diffusely demineralized. No acute osseous or soft tissue abnormality. IMPRESSION: Worsening interstitial opacities throughout both lungs, favored to reflect worsening edema on a background of emphysematous change seen on prior CT. Electronically Signed   By: Lovena Le M.D.   On: 05/26/2019 14:33   Dg Chest Port 1 View  Result Date: 05/25/2019 CLINICAL DATA:  Increased shortness of breath EXAM: PORTABLE CHEST 1 VIEW COMPARISON:  04/11/2019 FINDINGS: Cardiac shadow is stable. Aortic calcifications are noted. Increased interstitial markings are seen particularly on the left likely related to underlying edema. No sizable effusion is seen. No  bony abnormality is noted. IMPRESSION: Increasing interstitial changes likely related to underlying edema. Electronically Signed   By: Linus Mako.D.  On: 05/25/2019 10:46    Emeline Gins Kamaria Lucia M.D on 05/28/2019 at 1:40 PM  Between 7am to 7pm - Pager - [phone]  After 7pm go to [email or web address] - password [facility]  [facility] -  Office  [phone]

## 2019-05-29 LAB — CBC WITH DIFFERENTIAL/PLATELET
Abs Immature Granulocytes: 0.14 10*3/uL — ABNORMAL HIGH (ref 0.00–0.07)
Basophils Absolute: 0 10*3/uL (ref 0.0–0.1)
Basophils Relative: 0 %
Eosinophils Absolute: 0 10*3/uL (ref 0.0–0.5)
Eosinophils Relative: 0 %
HCT: 47.9 % (ref 39.0–52.0)
Hemoglobin: 15.4 g/dL (ref 13.0–17.0)
Immature Granulocytes: 1 %
Lymphocytes Relative: 3 %
Lymphs Abs: 0.5 10*3/uL — ABNORMAL LOW (ref 0.7–4.0)
MCH: 29.3 pg (ref 26.0–34.0)
MCHC: 32.2 g/dL (ref 30.0–36.0)
MCV: 91.2 fL (ref 80.0–100.0)
Monocytes Absolute: 2.1 10*3/uL — ABNORMAL HIGH (ref 0.1–1.0)
Monocytes Relative: 12 %
Neutro Abs: 15.4 10*3/uL — ABNORMAL HIGH (ref 1.7–7.7)
Neutrophils Relative %: 84 %
Platelets: 351 10*3/uL (ref 150–400)
RBC: 5.25 MIL/uL (ref 4.22–5.81)
RDW: 14.8 % (ref 11.5–15.5)
WBC: 18.2 10*3/uL — ABNORMAL HIGH (ref 4.0–10.5)
nRBC: 0 % (ref 0.0–0.2)

## 2019-05-29 LAB — COMPREHENSIVE METABOLIC PANEL
ALT: 17 U/L (ref 0–44)
AST: 19 U/L (ref 15–41)
Albumin: 2.9 g/dL — ABNORMAL LOW (ref 3.5–5.0)
Alkaline Phosphatase: 66 U/L (ref 38–126)
Anion gap: 9 (ref 5–15)
BUN: 51 mg/dL — ABNORMAL HIGH (ref 8–23)
CO2: 24 mmol/L (ref 22–32)
Calcium: 8.5 mg/dL — ABNORMAL LOW (ref 8.9–10.3)
Chloride: 112 mmol/L — ABNORMAL HIGH (ref 98–111)
Creatinine, Ser: 1.09 mg/dL (ref 0.61–1.24)
GFR calc Af Amer: >60 mL/min (ref >60)
GFR calc non Af Amer: >60 mL/min (ref >60)
Glucose, Bld: 121 mg/dL — ABNORMAL HIGH (ref 70–99)
Potassium: 4.3 mmol/L (ref 3.5–5.1)
Sodium: 145 mmol/L (ref 135–145)
Total Bilirubin: 0.5 mg/dL (ref 0.3–1.2)
Total Protein: 6.4 g/dL — ABNORMAL LOW (ref 6.5–8.1)

## 2019-05-29 LAB — FERRITIN: Ferritin: 211 ng/mL (ref 24–336)

## 2019-05-29 LAB — TRIGLYCERIDES: Triglycerides: 64 mg/dL (ref <150)

## 2019-05-29 LAB — D-DIMER, QUANTITATIVE: D-Dimer, Quant: 1.2 ug/mL-FEU — ABNORMAL HIGH (ref 0.00–0.50)

## 2019-05-29 LAB — PHOSPHORUS: Phosphorus: 1.9 mg/dL — ABNORMAL LOW (ref 2.5–4.6)

## 2019-05-29 LAB — MAGNESIUM: Magnesium: 2 mg/dL (ref 1.7–2.4)

## 2019-05-29 LAB — C-REACTIVE PROTEIN: CRP: 4.6 mg/dL — ABNORMAL HIGH (ref <1.0)

## 2019-05-29 MED ORDER — K PHOS MONO-SOD PHOS DI & MONO 155-852-130 MG PO TABS
500.0000 mg | ORAL_TABLET | Freq: Three times a day (TID) | ORAL | Status: DC
  Administered 2019-05-29 – 2019-05-30 (×4): 500 mg via ORAL
  Filled 2019-05-29 (×4): qty 2

## 2019-05-29 MED ORDER — POLYETHYLENE GLYCOL 3350 17 G PO PACK
34.0000 g | PACK | Freq: Once | ORAL | Status: AC
  Administered 2019-05-29: 34 g via ORAL
  Filled 2019-05-29: qty 2

## 2019-05-29 MED ORDER — PANTOPRAZOLE SODIUM 40 MG PO TBEC
40.0000 mg | DELAYED_RELEASE_TABLET | Freq: Every day | ORAL | Status: DC
  Administered 2019-05-29 – 2019-06-03 (×6): 40 mg via ORAL
  Filled 2019-05-29 (×6): qty 1

## 2019-05-29 MED ORDER — CARBAMIDE PEROXIDE 6.5 % OT SOLN
10.0000 [drp] | Freq: Two times a day (BID) | OTIC | Status: DC
  Administered 2019-05-29 – 2019-05-31 (×4): 10 [drp] via OTIC
  Filled 2019-05-29: qty 15

## 2019-05-29 NOTE — Progress Notes (Signed)
Called son Cristie Hem and updated, No questions at this time.

## 2019-05-29 NOTE — Progress Notes (Signed)
PROGRESS NOTE                                                                                                                                                                                                             Patient Demographics:    Kristopher [name], is a 73 y.o. male, DOB - 11/02/1945, [id number]  Admit date - 05/25/2019   Admitting Physician A Melven Sartorius., MD  Outpatient Primary MD for the patient is Alvester Chou, NP  LOS - 4   Chief Complaint  Patient presents with  . Shortness of Breath  . Weakness  . 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  As well found to be in renal failure, with a creatinine of 4, and he is with significant dysphagia as well, he was transferred to [facility] for further management.   Subjective:    Kristopher [name] today has, No headache, No chest pain, cough is improving, still productive, but less phlegm, reports had a good night sleep yesterday .    Assessment  & Plan :    Active Problems:   COVID-19 virus infection   AKI (acute kidney injury) ([place])   Acute hypoxic respiratory failure secondary to COVID-19 for pneumonia -Patient hypoxic 88% on presentation, this morning he was up to 6 L nasal cannula, I have lowered to 3 L nasal cannula with saturation around 90%. -He was encouraged to use incentive spirometry and flutter valve -Imaging significant for bilateral opacities.  As well hypoxic, started on IV Remdesivir 8/13 -Continue with IV steroids, will start tapering -Continue to trend inflammatory markers, D-dimers and CRP trending down -So far we will hold on Actemra and giving improvement respiratory status, and he is with known history of splenectomy. -Continue with zinc and vitamin C -  leukocytosis in the steroids, afebrile, nontoxic-appearing   COVID-19 Labs  Recent Labs    05/27/19 0326 05/28/19 0140 05/29/19 0310  DDIMER 2.88* 1.78* 1.20*  FERRITIN 158 168 211  CRP 8.7* 6.6* 4.6*    Lab Results  Component Value Date   SARSCOV2NAA POSITIVE (A) 05/25/2019   AKI -In the setting of solitary kidney, status  post left nephrectomy(traumatic) -This is most likely  prerenal, improving with IV fluids, hold nephrotoxic medication -Renal failure improving, creatinine of 4 on admission, significantly improved, it is at 1.09 today, currently off IV fluids  Dysphagia -Seen by SLP, advance to regular diet  Hyperkalemia -The setting of renal failure, resolved  Bradycardia -Have stopped carvedilol, resolved -EKG withLBBB, peers to be old, denies any chest pain  Hypertension/hypotension -Blood pressure on the lower side on admission, continue to hold Coreg and lisinopril  Protein calorie malnutrition -Continue with supplement, nutritionist consulted  Elevated lipase Lipase on presentation 127, trended down to 43  History of splenectomy High risk for bacterial infection  Chronic anxiety/depression Continue home medications  Hyperlipidemia Continue statin No transaminitis Continue to monitor LFTs  Hypophosphatemia -Started on Neutra-Phos  Tobacco use disorder Tobacco cessation counseling at bedside. Nicotine patch as needed  Code Status : Full code  Family Communication  : Discussed with son Cristie Hem per patient request [phone] on 8/15  Disposition Plan  : Home  Barriers For Discharge : Remains on oxygen, fluids, Remdesivir and steroids  Consults  :  none  Procedures  : none  DVT Prophylaxis  : Subcu heparin  Lab Results  Component Value Date   PLT 351 05/29/2019    Antibiotics  :    Anti-infectives (From admission, onward)   Start     Dose/Rate Route Frequency Ordered Stop   05/26/19 1700  remdesivir 100 mg in sodium chloride  0.9 % 250 mL IVPB     100 mg 500 mL/hr over 30 Minutes Intravenous Every 24 hours 05/25/19 1520 05/30/19 1659   05/25/19 1700  remdesivir 200 mg in sodium chloride 0.9 % 250 mL IVPB     200 mg 500 mL/hr over 30 Minutes Intravenous Once 05/25/19 1520 05/25/19 1725        Objective:   Vitals:   05/29/19 0856 05/29/19 0900 05/29/19 1000 05/29/19 1100  BP:  (!) 160/73 (!) 151/64 (!) 149/73  Pulse:  71 67 63  Resp:      Temp:      TempSrc:      SpO2: (!) 87% 95% 93% 96%  Weight:        Wt Readings from Last 3 Encounters:  05/27/19 61.3 kg  02/12/18 66.1 kg  04/17/17 63 kg     Intake/Output Summary (Last 24 hours) at 05/29/2019 1431 Last data filed at 05/29/2019 1000 Gross per 24 hour  Intake 940 ml  Output 1700 ml  Net -760 ml     Physical Exam  Awake Alert, Oriented X 3, No new F.N deficits, Normal affect, thin appearing, chronically ill-appearing male Symmetrical Chest wall movement, Good air movement bilaterally, CTAB RRR,No Gallops,Rubs or new Murmurs, No Parasternal Heave +ve B.Sounds, Abd Soft, No tenderness, No rebound - guarding or rigidity. No Cyanosis, Clubbing or edema, No new Rash or bruise      Data Review:    CBC Recent Labs  Lab 05/25/19 1022 05/26/19 0105 05/27/19 0326 05/28/19 0140 05/29/19 0310  WBC 12.4* 9.3 20.0* 16.8* 18.2*  HGB 16.4 14.1 14.4 14.1 15.4  HCT 50.6 45.6 44.8 43.6 47.9  PLT 405* 419* 358 344 351  MCV 92.5 94.6 92.9 92.2 91.2  MCH 30.0 29.3 29.9 29.8 29.3  MCHC 32.4 30.9 32.1 32.3 32.2  RDW 14.6 14.6 14.7 14.9 14.8  LYMPHSABS 1.2 0.4* 0.7 0.4* 0.5*  MONOABS 1.0 0.1 0.5 0.8 2.1*  EOSABS 0.1 0.0 0.0 0.0 0.0  BASOSABS 0.0 0.0  0.0 0.0 0.0    Chemistries  Recent Labs  Lab 05/25/19 1022  05/25/19 1951  05/26/19 0105  05/26/19 1430 05/26/19 1904 05/27/19 0326 05/28/19 0140 05/29/19 0310  NA 135   < > 135  --  138  --   --   --  145 143 145  K 5.7*   < > 6.5*   < > 5.6*   < > 5.3* 5.0 4.2 4.2 4.3  CL 95*   < > 104   --  109  --   --   --  112* 112* 112*  CO2 25   < > 21*  --  22  --   --   --  23 25 24   GLUCOSE 123*   < > 253*  --  121*  --   --   --  121* 157* 121*  BUN 69*   < > 63*  --  58*  --   --   --  45* 51* 51*  CREATININE 3.99*   < > 3.41*  --  2.69*  --   --   --  1.68* 1.37* 1.09  CALCIUM 9.1   < > 7.9*  --  8.4*  --   --   --  8.5* 8.3* 8.5*  MG  --   --   --   --  2.6*  --   --   --  2.3 2.2 2.0  AST 24  --   --   --  17  --   --   --  13* 16 19  ALT 22  --   --   --  17  --   --   --  13 14 17   ALKPHOS 83  --   --   --  71  --   --   --  68 66 66  BILITOT 0.8  --   --   --  0.5  --   --   --  0.4 0.4 0.5   < > = values in this interval not displayed.   ------------------------------------------------------------------------------------------------------------------ Recent Labs    05/28/19 0140 05/29/19 0310  TRIG 60 64    No results found for: HGBA1C ------------------------------------------------------------------------------------------------------------------ No results for input(s): TSH, T4TOTAL, T3FREE, THYROIDAB in the last 72 hours.  Invalid input(s): FREET3 ------------------------------------------------------------------------------------------------------------------ Recent Labs    05/28/19 0140 05/29/19 0310  FERRITIN 168 211    Coagulation profile No results for input(s): INR, PROTIME in the last 168 hours.  Recent Labs    05/28/19 0140 05/29/19 0310  DDIMER 1.78* 1.20*    Cardiac Enzymes No results for input(s): CKMB, TROPONINI, MYOGLOBIN in the last 168 hours.  Invalid input(s): CK ------------------------------------------------------------------------------------------------------------------    Component Value Date/Time   BNP 33.4 05/25/2019 1022    Inpatient Medications  Scheduled Meds: . Chlorhexidine Gluconate Cloth  6 each Topical Daily  . cholecalciferol  1,000 Units Oral Daily  . feeding supplement (ENSURE ENLIVE)  237 mL Oral  BID BM  . FLUoxetine  20 mg Oral QHS  . guaiFENesin  1,200 mg Oral BID  . heparin  5,000 Units Subcutaneous Q8H  . ipratropium  2 puff Inhalation Q8H  . levalbuterol  2 puff Inhalation Q8H  . loratadine  10 mg Oral Daily  . methylPREDNISolone (SOLU-MEDROL) injection  60 mg Intravenous Q12H  . nicotine  7 mg Transdermal QHS  . pantoprazole  40 mg Oral Daily  . phosphorus  500 mg Oral TID  .  polyethylene glycol  17 g Oral QHS  . simvastatin  20 mg Oral QHS  . tamsulosin  0.4 mg Oral QPC supper  . vitamin C  500 mg Oral Daily  . zinc sulfate  220 mg Oral Daily   Continuous Infusions: . remdesivir 100 mg in NS 250 mL 100 mg (05/28/19 1630)   PRN Meds:.acetaminophen **OR** acetaminophen, ALPRAZolam, chlorpheniramine-HYDROcodone, guaiFENesin-dextromethorphan, nicotine polacrilex, ondansetron (ZOFRAN) IV, oxyCODONE  Micro Results Recent Results (from the past 240 hour(s))  Blood Culture (routine x 2)     Status: None (Preliminary result)   Collection Time: 05/25/19 10:22 AM   Specimen: BLOOD  Result Value Ref Range Status   Specimen Description   Final    BLOOD LEFT ANTECUBITAL Performed at [place] [address], [place], [place] [address]    Special Requests   Final    BOTTLES DRAWN AEROBIC AND ANAEROBIC Blood Culture adequate volume Performed at [place] [address], [place], [place] [address]    Culture   Final    NO GROWTH 4 DAYS Performed at [facility], [place] [address] [place], [place] [address]    Report Status PENDING  Incomplete  Blood Culture (routine x 2)     Status: None (Preliminary result)   Collection Time: 05/25/19 10:22 AM   Specimen: BLOOD LEFT FOREARM  Result Value Ref Range Status   Specimen Description   Final    BLOOD LEFT FOREARM Performed at [facility], [place] [address], [place], [place] [address]    Special Requests   Final    BOTTLES DRAWN AEROBIC AND ANAEROBIC Blood Culture adequate  volume Performed at [place] [address], [place], [place] [address]    Culture   Final    NO GROWTH 4 DAYS Performed at [facility], [place] [address], [place], [place] [address]    Report Status PENDING  Incomplete  SARS Coronavirus 2 [facility] order, Performed in [facility] hospital lab) Nasopharyngeal Nasopharyngeal Swab     Status: Abnormal   Collection Time: 05/25/19 10:58 AM   Specimen: Nasopharyngeal Swab  Result Value Ref Range Status   SARS Coronavirus 2 POSITIVE (A) NEGATIVE Final    Comment: RESULT CALLED TO, READ BACK BY AND VERIFIED WITH: TIM SMITH,RN IN:[id number] @ [id number] BY J SCOTTON (NOTE) If result is NEGATIVE SARS-CoV-2 target nucleic acids are NOT DETECTED. The SARS-CoV-2 RNA is generally detectable in upper and lower  respiratory specimens during the acute phase of infection. The lowest  concentration of SARS-CoV-2 viral copies this assay can detect is 250  copies / mL. A negative result does not preclude SARS-CoV-2 infection  and should not be used as the sole basis for treatment or other  patient management decisions.  A negative result may occur with  improper specimen collection / handling, submission of specimen other  than nasopharyngeal swab, presence of viral mutation(s) within the  areas targeted by this assay, and inadequate number of viral copies  (<250 copies / mL). A negative result must be combined with clinical  observations, patient history, and epidemiological information. If result is POSITIVE SARS-CoV-2 target nucleic acids are DETECTED . The SARS-CoV-2 RNA is generally detectable in upper and lower  respiratory specimens during the acute phase of infection.  Positive  results are indicative of active infection with SARS-CoV-2.  Clinical  correlation with patient history and other diagnostic information is  necessary to determine patient infection status.  Positive results do  not rule out bacterial infection or  co-infection with other viruses. If result is PRESUMPTIVE POSTIVE SARS-CoV-2 nucleic acids MAY BE PRESENT.   A presumptive positive result was obtained on the submitted specimen  and confirmed on repeat testing.  While 2019 novel coronavirus  (SARS-CoV-2) nucleic acids may be present in the submitted sample  additional confirmatory testing may be necessary for epidemiological  and / or clinical management purposes  to differentiate between  SARS-CoV-2 and other Sarbecovirus currently known to infect humans.  If clinically indicated additional testing with an alternate test  methodology [phone] ) is advised. The SARS-CoV-2 RNA is generally  detectable in upper and lower respiratory specimens during the acute  phase of infection. The expected result is Negative. Fact Sheet for Patients:  StrictlyIdeas.no Fact Sheet for Healthcare Providers: BankingDealers.co.za This test is not yet approved or cleared by the [place] FDA and has been authorized for detection and/or diagnosis of SARS-CoV-2 by FDA under an Emergency Use Authorization (EUA).  This EUA will remain in effect (meaning this test can be used) for the duration of the COVID-19 declaration under Section 564(b)(1) of the Act, 21 U.S.C. section 360bbb-3(b)(1), unless the authorization is terminated or revoked sooner. Performed at [facility], [place] [address] [place], [place] [address]   Urine culture     Status: None   Collection Time: 05/25/19 10:52 PM   Specimen: Urine, Random  Result Value Ref Range Status   Specimen Description   Final    URINE, RANDOM Performed at [place] [address], [place], [place] [address]    Special Requests   Final    NONE Performed at [facility], [place] [address] [place], [place] [address]    Culture   Final    NO GROWTH Performed at [facility], [place] [address],  [place], [place] [address]    Report Status 05/26/2019 FINAL  Final  Expectorated sputum assessment w rflx to resp cult     Status: None   Collection Time: 05/26/19  1:07 AM   Specimen: Sputum  Result Value Ref Range Status   Specimen Description SPUTUM  Final   Special Requests NONE  Final   Sputum evaluation   Final    THIS SPECIMEN IS ACCEPTABLE FOR SPUTUM CULTURE Performed at [facility], [place] [address], [place], [place] [address]    Report Status 05/26/2019 FINAL  Final  Culture, respiratory     Status: None   Collection Time: 05/26/19  1:07 AM   Specimen: SPU  Result Value Ref Range Status   Specimen Description   Final    SPUTUM Performed at [place] [address], [place], [place] [address]    Special Requests   Final    NONE Reflexed from [phone] Performed at [facility], [place] [address] [place], [place] [address]    Gram Stain   Final    RARE WBC PRESENT, PREDOMINANTLY PMN FEW GRAM NEGATIVE RODS FEW GRAM POSITIVE COCCI IN CLUSTERS FEW GRAM NEGATIVE COCCOBACILLI    Culture   Final    MODERATE HAEMOPHILUS INFLUENZAE BETA LACTAMASE POSITIVE Performed at [facility], [address] [place], [place] [address]    Report Status 05/28/2019 FINAL  Final    Radiology Reports Ct Abdomen Pelvis Wo Contrast  Result Date: 05/25/2019 CLINICAL DATA:  73 year old male with history of smoking presenting with nausea vomiting and diarrhea.  Patient was found to be hypoxic and COVID-19 positive. EXAM: CT ABDOMEN AND PELVIS WITHOUT CONTRAST TECHNIQUE: Multidetector CT imaging of the abdomen and pelvis was performed following the standard protocol without IV contrast. COMPARISON:  CT of the abdomen pelvis dated 08/21/2012. Chest radiograph dated 05/25/2019 FINDINGS: Evaluation of this exam is limited in the absence of intravenous contrast. Evaluation is also limited due to streak artifact caused by patient's arms as well  as respiratory motion artifact. Lower chest: There is emphysematous changes of the lungs. Left lung base densities, new since the study of 2013 and concerning for superimposed infectious process. Clinical correlation is recommended. There is multi vessel coronary vascular calcification. No intra-abdominal free air or free fluid. Hepatobiliary: The liver is unremarkable. No intrahepatic biliary ductal dilatation. Cholecystectomy. Pancreas: Unremarkable. No pancreatic ductal dilatation or surrounding inflammatory changes. Spleen: Status post prior splenectomy with multiple residual splenic tissue. Adrenals/Urinary Tract: The adrenal glands are unremarkable. Postsurgical changes of prior left nephrectomy. Several right renal lesions which are not characterized on this unenhanced CT. The largest lesion in the upper pole of the right kidney measures up to 3.3 cm and demonstrates a fluid attenuation most consistent with a cyst. Several smaller high attenuating lesions are not characterized but may represent complex/hemorrhagic cysts. Ultrasound may provide better characterization. There is no hydronephrosis or nephrolithiasis. Renal vascular calcifications noted. The right ureter and urinary bladder appear unremarkable. Stomach/Bowel: There is no bowel obstruction or active inflammation. The appendix is not visualized with certainty. No inflammatory changes identified in the right lower quadrant. Vascular/Lymphatic: There is advanced atherosclerotic calcification of the aorta and iliac arteries. The IVC is unremarkable. No portal venous gas. There is no adenopathy. Reproductive: Mildly enlarged prostate gland measuring approximately 4.3 cm in transverse axial diameter. Other: Midline vertical anterior pelvic wall incisional scar. There is a small fat containing ventral hernia the left of the midline. The neck of the hernia defect measures approximately 17 mm in transverse axial diameter. There is small amount of fluid and  mild stranding of the herniated fat. Correlation with clinical exam is recommended to exclude strangulation or incarceration. No herniated bowel identified. Musculoskeletal: Advanced osteopenia. Multilevel degenerative changes of the spine with enthesopathy and findings consistent with ankylosing spondylitis. No acute fracture. IMPRESSION: 1. Increased left lung base densities concerning for developing infiltrate. Clinical correlation is recommended. 2. No acute intra-abdominal or pelvic pathology. No bowel obstruction or active inflammation. 3. Small fat containing ventral hernia with mild stranding of the herniated fat. Correlation with clinical exam is recommended to exclude strangulation or incarceration. No herniated bowel. 4. Aortic Atherosclerosis (ICD10-I70.0) and Emphysema (ICD10-J43.9). Electronically Signed   By: Anner Crete M.D.   On: 05/25/2019 21:43   Dg Chest Port 1 View  Result Date: 05/26/2019 CLINICAL DATA:  Hypoxia EXAM: PORTABLE CHEST 1 VIEW COMPARISON:  CT 04/12/2014, radiograph 05/25/2019. FINDINGS: Worsening interstitial opacities throughout both lungs, favored to reflect edema on a background of emphysematous change seen on prior CT. Apical pleuroparenchymal scarring is present. Abundant pericardial fat pad in the left lung base. The heart appears slightly enlarged from prior portable technique as do the central pulmonary arteries. Surgical clips are present in the left upper quadrant. Bones are diffusely demineralized. No acute osseous or soft tissue abnormality. IMPRESSION: Worsening interstitial opacities throughout both lungs, favored to reflect worsening edema on a background of emphysematous change seen on prior CT. Electronically Signed   By: Lovena Le M.D.   On: 05/26/2019 14:33   Dg Chest Port 1  View  Result Date: 05/25/2019 CLINICAL DATA:  Increased shortness of breath EXAM: PORTABLE CHEST 1 VIEW COMPARISON:  04/11/2019 FINDINGS: Cardiac shadow is stable. Aortic  calcifications are noted. Increased interstitial markings are seen particularly on the left likely related to underlying edema. No sizable effusion is seen. No bony abnormality is noted. IMPRESSION: Increasing interstitial changes likely related to underlying edema. Electronically Signed   By: Inez Catalina M.D.   On: 05/25/2019 10:46    Phillips Climes M.D on 05/29/2019 at 2:31 PM  Between 7am to 7pm - Pager - [phone]  After 7pm go to [email or web address] - password [facility]  [facility] -  Office  [phone]

## 2019-05-29 NOTE — Progress Notes (Signed)
PHARMACIST - PHYSICIAN COMMUNICATION  DR:   Elgergawy CONCERNING: IV to Oral Route Change Policy  RECOMMENDATION: This patient is receiving Protonix by the intravenous route.  Based on criteria approved by the Pharmacy and Therapeutics Committee, the intravenous medication(s) is/are being converted to the equivalent oral dose form(s).   DESCRIPTION: These criteria include:  The patient is eating (either orally or via tube) and/or has been taking other orally administered medications for a least 24 hours  The patient has no evidence of active gastrointestinal bleeding or impaired GI absorption (gastrectomy, short bowel, patient on TNA or NPO).  If you have questions about this conversion, please contact the [place] PharmD, BCPS Clinical pharmacist phone [place]: [phone] 05/29/2019 8:05 AM

## 2019-05-30 ENCOUNTER — Inpatient Hospital Stay (HOSPITAL_COMMUNITY): Payer: Medicare Other

## 2019-05-30 LAB — COMPREHENSIVE METABOLIC PANEL
ALT: 18 U/L (ref 0–44)
AST: 16 U/L (ref 15–41)
Albumin: 2.7 g/dL — ABNORMAL LOW (ref 3.5–5.0)
Alkaline Phosphatase: 66 U/L (ref 38–126)
Anion gap: 9 (ref 5–15)
BUN: 50 mg/dL — ABNORMAL HIGH (ref 8–23)
CO2: 27 mmol/L (ref 22–32)
Calcium: 8.3 mg/dL — ABNORMAL LOW (ref 8.9–10.3)
Chloride: 109 mmol/L (ref 98–111)
Creatinine, Ser: 1.13 mg/dL (ref 0.61–1.24)
GFR calc Af Amer: >60 mL/min (ref >60)
GFR calc non Af Amer: >60 mL/min (ref >60)
Glucose, Bld: 130 mg/dL — ABNORMAL HIGH (ref 70–99)
Potassium: 4.4 mmol/L (ref 3.5–5.1)
Sodium: 145 mmol/L (ref 135–145)
Total Bilirubin: 0.8 mg/dL (ref 0.3–1.2)
Total Protein: 5.9 g/dL — ABNORMAL LOW (ref 6.5–8.1)

## 2019-05-30 LAB — CBC WITH DIFFERENTIAL/PLATELET
Abs Immature Granulocytes: 0.08 10*3/uL — ABNORMAL HIGH (ref 0.00–0.07)
Basophils Absolute: 0 10*3/uL (ref 0.0–0.1)
Basophils Relative: 0 %
Eosinophils Absolute: 0 10*3/uL (ref 0.0–0.5)
Eosinophils Relative: 0 %
HCT: 42.9 % (ref 39.0–52.0)
Hemoglobin: 14.2 g/dL (ref 13.0–17.0)
Immature Granulocytes: 1 %
Lymphocytes Relative: 3 %
Lymphs Abs: 0.3 10*3/uL — ABNORMAL LOW (ref 0.7–4.0)
MCH: 29.4 pg (ref 26.0–34.0)
MCHC: 33.1 g/dL (ref 30.0–36.0)
MCV: 88.8 fL (ref 80.0–100.0)
Monocytes Absolute: 0.9 10*3/uL (ref 0.1–1.0)
Monocytes Relative: 8 %
Neutro Abs: 10.2 10*3/uL — ABNORMAL HIGH (ref 1.7–7.7)
Neutrophils Relative %: 88 %
Platelets: 311 10*3/uL (ref 150–400)
RBC: 4.83 MIL/uL (ref 4.22–5.81)
RDW: 14.5 % (ref 11.5–15.5)
WBC: 11.5 10*3/uL — ABNORMAL HIGH (ref 4.0–10.5)
nRBC: 0 % (ref 0.0–0.2)

## 2019-05-30 LAB — CULTURE, BLOOD (ROUTINE X 2)
Culture: NO GROWTH
Culture: NO GROWTH
Special Requests: ADEQUATE
Special Requests: ADEQUATE

## 2019-05-30 LAB — PHOSPHORUS: Phosphorus: 2.9 mg/dL (ref 2.5–4.6)

## 2019-05-30 LAB — C-REACTIVE PROTEIN: CRP: 7.6 mg/dL — ABNORMAL HIGH (ref <1.0)

## 2019-05-30 LAB — FERRITIN: Ferritin: 203 ng/mL (ref 24–336)

## 2019-05-30 LAB — D-DIMER, QUANTITATIVE: D-Dimer, Quant: 0.9 ug/mL-FEU — ABNORMAL HIGH (ref 0.00–0.50)

## 2019-05-30 LAB — MAGNESIUM
Magnesium: 1.9 mg/dL (ref 1.7–2.4)
Magnesium: 1.9 mg/dL (ref 1.7–2.4)

## 2019-05-30 LAB — BRAIN NATRIURETIC PEPTIDE: B Natriuretic Peptide: 1026.4 pg/mL — ABNORMAL HIGH (ref 0.0–100.0)

## 2019-05-30 LAB — TRIGLYCERIDES: Triglycerides: 19 mg/dL (ref <150)

## 2019-05-30 LAB — PROCALCITONIN: Procalcitonin: <0.1 ng/mL

## 2019-05-30 MED ORDER — K PHOS MONO-SOD PHOS DI & MONO 155-852-130 MG PO TABS
500.0000 mg | ORAL_TABLET | Freq: Every day | ORAL | Status: DC
  Administered 2019-05-31 – 2019-06-03 (×4): 500 mg via ORAL
  Filled 2019-05-30 (×4): qty 2

## 2019-05-30 MED ORDER — FUROSEMIDE 10 MG/ML IJ SOLN
40.0000 mg | Freq: Four times a day (QID) | INTRAMUSCULAR | Status: AC
  Administered 2019-05-30 (×2): 40 mg via INTRAVENOUS
  Filled 2019-05-30 (×2): qty 4

## 2019-05-30 MED ORDER — SODIUM CHLORIDE 0.9 % IV SOLN
1.0000 g | INTRAVENOUS | Status: AC
  Administered 2019-05-30 – 2019-06-03 (×5): 1 g via INTRAVENOUS
  Filled 2019-05-30 (×5): qty 10

## 2019-05-30 MED ORDER — ENOXAPARIN SODIUM 40 MG/0.4ML ~~LOC~~ SOLN
40.0000 mg | SUBCUTANEOUS | Status: DC
  Administered 2019-05-30 – 2019-06-02 (×4): 40 mg via SUBCUTANEOUS
  Filled 2019-05-30 (×4): qty 0.4

## 2019-05-30 MED ORDER — AMLODIPINE BESYLATE 5 MG PO TABS
10.0000 mg | ORAL_TABLET | Freq: Every day | ORAL | Status: DC
  Administered 2019-05-30 – 2019-06-03 (×5): 10 mg via ORAL
  Filled 2019-05-30 (×6): qty 2

## 2019-05-30 NOTE — Progress Notes (Signed)
Patient is in normal sinus with frequent PACs.  Patient is asymptomatic.  Dr. Jenne Pane gave a one time telephone readback order for Mg draw on patient NOW.

## 2019-05-30 NOTE — Progress Notes (Signed)
PROGRESS NOTE                                                                                                                                                                                                             Patient Demographics:    Kristopher [name], is a 73 y.o. 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  As well found to be in renal failure, with a creatinine of 4, and he is with significant dysphagia as well, he was transferred to [facility] for further management.   Subjective:    Kristopher [name] today has, No headache, No chest pain, cough is improving, less productive, creased oxygen requirement to 6 L today.    Assessment  & Plan :    Active Problems:   COVID-19 virus infection   AKI (acute kidney injury) ([place])   Acute hypoxic respiratory failure secondary to COVID-19 for pneumonia -Patient patient hypoxic 88% on presentation, patient oxygen requirement increased from 3 to 6 L overnight. -He was encouraged to use incentive spirometry and flutter valve -Imaging significant for bilateral opacities.  As well hypoxic, started on IV Remdesivir 8/13 -Sent with increased oxygen requirement this morning, as well has worsening right lung opacity, NP was obtained, it is elevated, as well has some lower extremity edema, will give IV Lasix 40 mg IV x2 -Sputum culture growing H influenza, given his increased oxygen requirement and worsening opacity he will be started on  Rocephin -Continue with IV steroids, will start tapering -Continue to trend inflammatory markers, CRP is trending up today, continue to monitor. -So far we will hold on Actemra and giving improvement respiratory status, and he is with known history of splenectomy. -Continue with zinc and vitamin C - leukocytosis in the steroids, afebrile, nontoxic-appearing   COVID-19 Labs  Recent Labs    05/28/19 0140 05/29/19 0310  05/30/19 0445  DDIMER 1.78* 1.20* 0.90*  FERRITIN 168 211 203  CRP 6.6* 4.6* 7.6*    Lab Results  Component Value Date   SARSCOV2NAA POSITIVE (A) 05/25/2019   AKI -In the setting of solitary kidney, status post left nephrectomy(traumatic) -This is most likely  prerenal, improving with IV fluids, hold nephrotoxic medication -Renal failure improving, creatinine of 4 on admission, significantly improved, it is at 1.09 today, currently off IV fluids  Dysphagia -Seen by SLP, advanced to regular diet  Hyperkalemia -The setting of renal failure, resolved  Bradycardia -Have stopped carvedilol, resolved -EKG withLBBB,appears to be old, denies any chest pain  Hypertension/hypotension -Blood pressure on the lower side on admission, antihypertensive medications has been held -Blood pressure started to increase today, will start on Norvasc  Protein calorie malnutrition -Continue with supplement, nutritionist consulted  Elevated lipase Lipase on presentation 127, trended down to 43  History of splenectomy High risk for bacterial infection  Chronic anxiety/depression Continue home medications  Hyperlipidemia Continue statin No transaminitis Continue to monitor LFTs  Hypophosphatemia -Started on Neutra-Phos  Tobacco use disorder Tobacco cessation counseling at bedside. Nicotine patch as needed  Code Status : Full code  Family Communication  : Discussed with son Cristie Hem per patient request [phone] on 8/15  Disposition Plan  : Home  Barriers  For Discharge : Remains on oxygen, fluids, Remdesivir and steroids  Consults  :  none  Procedures  : none  DVT Prophylaxis  : Subcu heparin  Lab Results  Component Value Date   PLT 311 05/30/2019    Antibiotics  :    Anti-infectives (From admission, onward)   Start     Dose/Rate Route Frequency Ordered Stop   05/30/19 1000  cefTRIAXone (ROCEPHIN) 1 g in sodium chloride 0.9 % 100 mL IVPB     1 g 200 mL/hr over 30 Minutes Intravenous Every 24 hours 05/30/19 0951 06/04/19 0959   05/26/19 1700  remdesivir 100 mg in sodium chloride 0.9 % 250 mL IVPB     100 mg 500 mL/hr over 30 Minutes Intravenous Every 24 hours 05/25/19 1520 05/29/19 1649   05/25/19 1700  remdesivir 200 mg in sodium chloride 0.9 % 250 mL IVPB     200 mg 500 mL/hr over 30 Minutes Intravenous Once 05/25/19 1520 05/25/19 1725        Objective:   Vitals:   05/29/19 2000 05/30/19 0405 05/30/19 0823 05/30/19 1139  BP: (!) 153/77 (!) 145/79 (!) 161/97   Pulse: 67 75 60   Resp: 20 (!) 22 (!) 22   Temp: 98.3 F (36.8 C) 98.7 F (37.1 C)  (!) 97.5 F (36.4 C)  TempSrc: Oral Oral  Oral  SpO2: (!) 87% 90% 92%   Weight:        Wt Readings from Last 3 Encounters:  05/27/19 61.3 kg  02/12/18 66.1 kg  04/17/17 63 kg     Intake/Output Summary (Last 24 hours) at 05/30/2019 1402 Last data filed at 05/30/2019 1321 Gross per 24 hour  Intake 730 ml  Output 1350 ml  Net -620 ml     Physical Exam  Awake Alert, Oriented X 3, No new F.N deficits, Normal affect,  thin appearing, chronically ill-appearing male Symmetrical Chest wall movement, Good air movement bilaterally, CTAB RRR,No Gallops,Rubs or new Murmurs, No Parasternal Heave +ve B.Sounds, Abd Soft, No tenderness, No rebound - guarding or rigidity. No Cyanosis, Clubbing or edema, No new Rash or bruise      Data Review:  CBC Recent Labs  Lab 05/26/19 0105 05/27/19 0326 05/28/19 0140 05/29/19 0310 05/30/19 0445  WBC 9.3 20.0* 16.8* 18.2* 11.5*   HGB 14.1 14.4 14.1 15.4 14.2  HCT 45.6 44.8 43.6 47.9 42.9  PLT 419* 358 344 351 311  MCV 94.6 92.9 92.2 91.2 88.8  MCH 29.3 29.9 29.8 29.3 29.4  MCHC 30.9 32.1 32.3 32.2 33.1  RDW 14.6 14.7 14.9 14.8 14.5  LYMPHSABS 0.4* 0.7 0.4* 0.5* 0.3*  MONOABS 0.1 0.5 0.8 2.1* 0.9  EOSABS 0.0 0.0 0.0 0.0 0.0  BASOSABS 0.0 0.0 0.0 0.0 0.0    Chemistries  Recent Labs  Lab 05/26/19 0105  05/26/19 1904 05/27/19 0326 05/28/19 0140 05/29/19 0310 05/30/19 0445  NA 138  --   --  145 143 145 145  K 5.6*   < > 5.0 4.2 4.2 4.3 4.4  CL 109  --   --  112* 112* 112* 109  CO2 22  --   --  23 25 24 27   GLUCOSE 121*  --   --  121* 157* 121* 130*  BUN 58*  --   --  45* 51* 51* 50*  CREATININE 2.69*  --   --  1.68* 1.37* 1.09 1.13  CALCIUM 8.4*  --   --  8.5* 8.3* 8.5* 8.3*  MG 2.6*  --   --  2.3 2.2 2.0 1.9  AST 17  --   --  13* 16 19 16   ALT 17  --   --  13 14 17 18   ALKPHOS 71  --   --  68 66 66 66  BILITOT 0.5  --   --  0.4 0.4 0.5 0.8   < > = values in this interval not displayed.   ------------------------------------------------------------------------------------------------------------------ Recent Labs    05/29/19 0310 05/30/19 0445  TRIG 64 19    No results found for: HGBA1C ------------------------------------------------------------------------------------------------------------------ No results for input(s): TSH, T4TOTAL, T3FREE, THYROIDAB in the last 72 hours.  Invalid input(s): FREET3 ------------------------------------------------------------------------------------------------------------------ Recent Labs    05/29/19 0310 05/30/19 0445  FERRITIN 211 203    Coagulation profile No results for input(s): INR, PROTIME in the last 168 hours.  Recent Labs    05/29/19 0310 05/30/19 0445  DDIMER 1.20* 0.90*    Cardiac Enzymes No results for input(s): CKMB, TROPONINI, MYOGLOBIN in the last 168 hours.  Invalid input(s):  CK ------------------------------------------------------------------------------------------------------------------    Component Value Date/Time   BNP 1,026.4 (H) 05/30/2019 0445    Inpatient Medications  Scheduled Meds:  amLODipine  10 mg Oral Daily   carbamide peroxide  10 drop Both EARS BID   Chlorhexidine Gluconate Cloth  6 each Topical Daily   cholecalciferol  1,000 Units Oral Daily   enoxaparin (LOVENOX) injection  40 mg Subcutaneous Q24H   feeding supplement (ENSURE ENLIVE)  237 mL Oral BID BM   FLUoxetine  20 mg Oral QHS   furosemide  40 mg Intravenous Q6H   guaiFENesin  1,200 mg Oral BID   ipratropium  2 puff Inhalation Q8H   levalbuterol  2 puff Inhalation Q8H   loratadine  10 mg Oral Daily   methylPREDNISolone (SOLU-MEDROL) injection  60 mg Intravenous Q12H   nicotine  7 mg Transdermal QHS   pantoprazole  40 mg Oral Daily   phosphorus  500 mg Oral TID   polyethylene glycol  17 g Oral QHS   simvastatin  20 mg Oral QHS   tamsulosin  0.4 mg Oral QPC supper   vitamin C  500 mg Oral Daily   zinc sulfate  220 mg Oral Daily   Continuous Infusions:  cefTRIAXone (ROCEPHIN)  IV 1 g (05/30/19 1207)   PRN Meds:.acetaminophen **OR** acetaminophen, ALPRAZolam, chlorpheniramine-HYDROcodone, guaiFENesin-dextromethorphan, nicotine polacrilex, ondansetron (ZOFRAN) IV, oxyCODONE  Micro Results Recent Results (from the past 240 hour(s))  Blood Culture (routine x 2)     Status: None   Collection Time: 05/25/19 10:22 AM   Specimen: BLOOD  Result Value Ref Range Status   Specimen Description   Final    BLOOD LEFT ANTECUBITAL Performed at [place] [address], [place], [place] [address]    Special Requests   Final    BOTTLES DRAWN AEROBIC AND ANAEROBIC Blood Culture adequate volume Performed at [facility] [address], [place], [place] [address]    Culture   Final    NO GROWTH 5 DAYS Performed at [facility], [place] [address] [place], [place] [address]    Report Status 05/30/2019 FINAL  Final  Blood Culture (routine x 2)     Status: None   Collection Time: 05/25/19 10:22 AM   Specimen: BLOOD LEFT FOREARM  Result Value Ref Range Status   Specimen Description   Final    BLOOD LEFT FOREARM Performed at [facility], [place] [address] [place], [place] [address]    Special Requests   Final    BOTTLES DRAWN AEROBIC AND ANAEROBIC Blood Culture adequate volume Performed at [place] [address] [place], [place] [address]    Culture   Final    NO GROWTH 5 DAYS Performed at [facility], [place] [address], [place], [place] [address]    Report Status 05/30/2019 FINAL  Final  SARS Coronavirus 2 [facility] order, Performed in [facility] hospital lab) Nasopharyngeal Nasopharyngeal Swab     Status: Abnormal   Collection Time: 05/25/19 10:58 AM   Specimen: Nasopharyngeal Swab  Result Value Ref Range Status   SARS Coronavirus 2 POSITIVE (A) NEGATIVE Final    Comment: RESULT CALLED TO, READ BACK BY AND VERIFIED WITH: TIM SMITH,RN [id number] @ [id number] BY J SCOTTON (NOTE) If result is NEGATIVE SARS-CoV-2 target nucleic acids are NOT DETECTED. The SARS-CoV-2 RNA is generally detectable in upper and lower  respiratory specimens during the acute phase of infection. The lowest  concentration of SARS-CoV-2 viral copies this assay can detect is 250  copies / mL. A negative result does not preclude SARS-CoV-2 infection  and should not be used as the sole basis for treatment or other  patient management decisions.  A negative result may occur with  improper specimen collection / handling, submission of specimen other  than nasopharyngeal swab, presence of viral mutation(s) within the  areas targeted by this assay, and inadequate number of viral copies  (<250 copies / mL). A negative result must be combined with clinical  observations, patient history, and  epidemiological information. If result is POSITIVE SARS-CoV-2 target nucleic acids are DETECTED . The SARS-CoV-2 RNA is generally detectable in upper and lower  respiratory specimens during the acute phase of infection.  Positive  results are indicative of active infection with SARS-CoV-2.  Clinical  correlation with patient history and other diagnostic information is  necessary to determine patient infection status.  Positive results do  not rule out bacterial infection or co-infection with other viruses. If result is PRESUMPTIVE POSTIVE SARS-CoV-2 nucleic acids MAY BE PRESENT.   A presumptive positive result was obtained  on the submitted specimen  and confirmed on repeat testing.  While 2019 novel coronavirus  (SARS-CoV-2) nucleic acids may be present in the submitted sample  additional confirmatory testing may be necessary for epidemiological  and / or clinical management purposes  to differentiate between  SARS-CoV-2 and other Sarbecovirus currently known to infect humans.  If clinically indicated additional testing with an alternate test  methodology [id number] ) is advised. The SARS-CoV-2 RNA is generally  detectable in upper and lower respiratory specimens during the acute  phase of infection. The expected result is Negative. Fact Sheet for Patients:  StrictlyIdeas.no Fact Sheet for Healthcare Providers: BankingDealers.co.za This test is not yet approved or cleared by the [place] FDA and has been authorized for detection and/or diagnosis of SARS-CoV-2 by FDA under an Emergency Use Authorization (EUA).  This EUA will remain in effect (meaning this test can be used) for the duration of the COVID-19 declaration under Section 564(b)(1) of the Act, 21 U.S.C. section 360bbb-3(b)(1), unless the authorization is terminated or revoked sooner. Performed at [facility], [place] [address], [place], [place] [address]     Urine culture     Status: None   Collection Time: 05/25/19 10:52 PM   Specimen: Urine, Random  Result Value Ref Range Status   Specimen Description   Final    URINE, RANDOM Performed at [place] [address] [place], [place] [address]    Special Requests   Final    NONE Performed at [facility], [place] [address] [place], [place] [address]    Culture   Final    NO GROWTH Performed at [facility], [place] [address], [place], [place] [address]    Report Status 05/26/2019 FINAL  Final  Expectorated sputum assessment w rflx to resp cult     Status: None   Collection Time: 05/26/19  1:07 AM   Specimen: Sputum  Result Value Ref Range Status   Specimen Description SPUTUM  Final   Special Requests NONE  Final   Sputum evaluation   Final    THIS SPECIMEN IS ACCEPTABLE FOR SPUTUM CULTURE Performed at [facility], [place] [address], [place], [place] [address]    Report Status 05/26/2019 FINAL  Final  Culture, respiratory     Status: None   Collection Time: 05/26/19  1:07 AM   Specimen: SPU  Result Value Ref Range Status   Specimen Description   Final    SPUTUM Performed at [place] [address] [place], [place] [address]    Special Requests   Final    NONE Reflexed from [id number] Performed at [facility], [place] [address] [place], [place] [address]    Gram Stain   Final    RARE WBC PRESENT, PREDOMINANTLY PMN FEW GRAM NEGATIVE RODS FEW GRAM POSITIVE COCCI IN CLUSTERS FEW GRAM NEGATIVE COCCOBACILLI    Culture   Final    MODERATE HAEMOPHILUS INFLUENZAE BETA LACTAMASE POSITIVE Performed at [facility], [address], [place], [place] [address]    Report Status 05/28/2019 FINAL  Final    Radiology Reports Ct Abdomen Pelvis Wo Contrast  Result Date: 05/25/2019 CLINICAL DATA:  73 year old male with history of smoking presenting with nausea vomiting and  diarrhea. Patient was found to be hypoxic and COVID-19 positive. EXAM: CT ABDOMEN AND PELVIS WITHOUT CONTRAST TECHNIQUE: Multidetector CT imaging of the abdomen and pelvis was performed following the standard protocol without  IV contrast. COMPARISON:  CT of the abdomen pelvis dated 08/21/2012. Chest radiograph dated 05/25/2019 FINDINGS: Evaluation of this exam is limited in the absence of intravenous contrast. Evaluation is also limited due to streak artifact caused by patient's arms as well as respiratory motion artifact. Lower chest: There is emphysematous changes of the lungs. Left lung base densities, new since the study of 2013 and concerning for superimposed infectious process. Clinical correlation is recommended. There is multi vessel coronary vascular calcification. No intra-abdominal free air or free fluid. Hepatobiliary: The liver is unremarkable. No intrahepatic biliary ductal dilatation. Cholecystectomy. Pancreas: Unremarkable. No pancreatic ductal dilatation or surrounding inflammatory changes. Spleen: Status post prior splenectomy with multiple residual splenic tissue. Adrenals/Urinary Tract: The adrenal glands are unremarkable. Postsurgical changes of prior left nephrectomy. Several right renal lesions which are not characterized on this unenhanced CT. The largest lesion in the upper pole of the right kidney measures up to 3.3 cm and demonstrates a fluid attenuation most consistent with a cyst. Several smaller high attenuating lesions are not characterized but may represent complex/hemorrhagic cysts. Ultrasound may provide better characterization. There is no hydronephrosis or nephrolithiasis. Renal vascular calcifications noted. The right ureter and urinary bladder appear unremarkable. Stomach/Bowel: There is no bowel obstruction or active inflammation. The appendix is not visualized with certainty. No inflammatory changes identified in the right lower quadrant. Vascular/Lymphatic: There is advanced  atherosclerotic calcification of the aorta and iliac arteries. The IVC is unremarkable. No portal venous gas. There is no adenopathy. Reproductive: Mildly enlarged prostate gland measuring approximately 4.3 cm in transverse axial diameter. Other: Midline vertical anterior pelvic wall incisional scar. There is a small fat containing ventral hernia the left of the midline. The neck of the hernia defect measures approximately 17 mm in transverse axial diameter. There is small amount of fluid and mild stranding of the herniated fat. Correlation with clinical exam is recommended to exclude strangulation or incarceration. No herniated bowel identified. Musculoskeletal: Advanced osteopenia. Multilevel degenerative changes of the spine with enthesopathy and findings consistent with ankylosing spondylitis. No acute fracture. IMPRESSION: 1. Increased left lung base densities concerning for developing infiltrate. Clinical correlation is recommended. 2. No acute intra-abdominal or pelvic pathology. No bowel obstruction or active inflammation. 3. Small fat containing ventral hernia with mild stranding of the herniated fat. Correlation with clinical exam is recommended to exclude strangulation or incarceration. No herniated bowel. 4. Aortic Atherosclerosis (ICD10-I70.0) and Emphysema (ICD10-J43.9). Electronically Signed   By: Anner Crete M.D.   On: 05/25/2019 21:43   Dg Chest Port 1 View  Result Date: 05/30/2019 CLINICAL DATA:  Hypoxia. EXAM: PORTABLE CHEST 1 VIEW COMPARISON:  05/26/2019. FINDINGS: The heart remains normal in size. Diffusely increased prominence of the interstitial markings in the right lung with no significant change in diffuse prominence of the interstitial markings in the left lung. Small left pleural effusion with improvement. Diffuse osteopenia. Left upper quadrant abdominal surgical clips. IMPRESSION: Mildly progressive diffuse interstitial prominence on the right, stable on the left. Differential  considerations include viral pneumonitis, drug reaction and pulmonary edema. Electronically Signed   By: Claudie Revering M.D.   On: 05/30/2019 08:18   Dg Chest Port 1 View  Result Date: 05/26/2019 CLINICAL DATA:  Hypoxia EXAM: PORTABLE CHEST 1 VIEW COMPARISON:  CT 04/12/2014, radiograph 05/25/2019. FINDINGS: Worsening interstitial opacities throughout both lungs, favored to reflect edema on a background of emphysematous change seen on prior CT. Apical pleuroparenchymal scarring is present. Abundant pericardial fat pad in the left lung base. The heart  appears slightly enlarged from prior portable technique as do the central pulmonary arteries. Surgical clips are present in the left upper quadrant. Bones are diffusely demineralized. No acute osseous or soft tissue abnormality. IMPRESSION: Worsening interstitial opacities throughout both lungs, favored to reflect worsening edema on a background of emphysematous change seen on prior CT. Electronically Signed   By: Lovena Le M.D.   On: 05/26/2019 14:33   Dg Chest Port 1 View  Result Date: 05/25/2019 CLINICAL DATA:  Increased shortness of breath EXAM: PORTABLE CHEST 1 VIEW COMPARISON:  04/11/2019 FINDINGS: Cardiac shadow is stable. Aortic calcifications are noted. Increased interstitial markings are seen particularly on the left likely related to underlying edema. No sizable effusion is seen. No bony abnormality is noted. IMPRESSION: Increasing interstitial changes likely related to underlying edema. Electronically Signed   By: Inez Catalina M.D.   On: 05/25/2019 10:46    Phillips Climes M.D on 05/30/2019 at 2:02 PM  Between 7am to 7pm - Pager - [phone]  After 7pm go to [email or web address] - password [facility]  [facility] -  Office  [phone]

## 2019-05-30 NOTE — Progress Notes (Signed)
His scr has trended down and CrC>30ml. We will change his heparin to lovenox 40mg  qday. His sputum cx grew out h.flu with beta lactamase neg. We will start him on 5d of ceftriaxone. D/w case with Dr. Waldron Labs.   Onnie Boer, PharmD, BCIDP, AAHIVP, CPP Infectious Disease Pharmacist 05/30/2019 10:03 AM

## 2019-05-30 NOTE — Progress Notes (Signed)
Initial Nutrition Assessment RD working remotely.  DOCUMENTATION CODES:   Not applicable  INTERVENTION:    Ensure Enlive po BID, each supplement provides 350 kcal and 20 grams of protein   Pt receiving Hormel Shake daily with Breakfast which provides 520 kcals and 22 g of protein and Magic cup BID with lunch and dinner, each supplement provides 290 kcal and 9 grams of protein, automatically on meal trays to optimize nutritional intake.   NUTRITION DIAGNOSIS:   Increased nutrient needs related to acute illness(COVID) as evidenced by estimated needs.  GOAL:   Patient will meet greater than or equal to 90% of their needs  MONITOR:   PO intake, Supplement acceptance, Labs  REASON FOR ASSESSMENT:   Consult Assessment of nutrition requirement/status  ASSESSMENT:   73 yo male admitted with COVID-19. PMH includes smoking, HF, anxiety/depression, L nephrectomy.   Unable to speak with patient today, no answer when phone number called.  Patient had significant dysphagia on admission. SLP following and has advanced diet to regular.   Patient is consuming 25-100% of meals.   Labs reviewed.   Medications reviewed and include vitamin D-3, lasix, solumedrol, flomax, miralax, vitamin C, and zinc sulfate.   NUTRITION - FOCUSED PHYSICAL EXAM:  unable to complete  Diet Order:   Diet Order            Diet regular Room service appropriate? Yes; Fluid consistency: Thin  Diet effective now              EDUCATION NEEDS:   Not appropriate for education at this time  Skin:  Skin Assessment: Reviewed RN Assessment  Last BM:  8/13 (type 7)  Height:   Ht Readings from Last 1 Encounters:  05/30/19 5\' 7"  (1.702 m)    Weight:   Wt Readings from Last 1 Encounters:  05/27/19 61.3 kg    Ideal Body Weight:  67.3 kg  BMI:  Body mass index is 21.17 kg/m.  Estimated Nutritional Needs:   Kcal:  1900-2100  Protein:  85-105 gm  Fluid:  >/= 2 L    Molli Barrows,  RD, LDN, [place] Pager [phone] After Hours Pager [phone]

## 2019-05-31 LAB — CBC
HCT: 48.1 % (ref 39.0–52.0)
Hemoglobin: 16.2 g/dL (ref 13.0–17.0)
MCH: 29.5 pg (ref 26.0–34.0)
MCHC: 33.7 g/dL (ref 30.0–36.0)
MCV: 87.5 fL (ref 80.0–100.0)
Platelets: 331 10*3/uL (ref 150–400)
RBC: 5.5 MIL/uL (ref 4.22–5.81)
RDW: 14.1 % (ref 11.5–15.5)
WBC: 9.4 10*3/uL (ref 4.0–10.5)
nRBC: 0 % (ref 0.0–0.2)

## 2019-05-31 LAB — COMPREHENSIVE METABOLIC PANEL
ALT: 21 U/L (ref 0–44)
AST: 16 U/L (ref 15–41)
Albumin: 3 g/dL — ABNORMAL LOW (ref 3.5–5.0)
Alkaline Phosphatase: 69 U/L (ref 38–126)
Anion gap: 12 (ref 5–15)
BUN: 55 mg/dL — ABNORMAL HIGH (ref 8–23)
CO2: 33 mmol/L — ABNORMAL HIGH (ref 22–32)
Calcium: 8.7 mg/dL — ABNORMAL LOW (ref 8.9–10.3)
Chloride: 96 mmol/L — ABNORMAL LOW (ref 98–111)
Creatinine, Ser: 1.12 mg/dL (ref 0.61–1.24)
GFR calc Af Amer: >60 mL/min (ref >60)
GFR calc non Af Amer: >60 mL/min (ref >60)
Glucose, Bld: 152 mg/dL — ABNORMAL HIGH (ref 70–99)
Potassium: 4.3 mmol/L (ref 3.5–5.1)
Sodium: 141 mmol/L (ref 135–145)
Total Bilirubin: 0.9 mg/dL (ref 0.3–1.2)
Total Protein: 6.9 g/dL (ref 6.5–8.1)

## 2019-05-31 LAB — BRAIN NATRIURETIC PEPTIDE: B Natriuretic Peptide: 458.9 pg/mL — ABNORMAL HIGH (ref 0.0–100.0)

## 2019-05-31 LAB — C-REACTIVE PROTEIN: CRP: 5.1 mg/dL — ABNORMAL HIGH (ref <1.0)

## 2019-05-31 LAB — PROCALCITONIN: Procalcitonin: <0.1 ng/mL

## 2019-05-31 MED ORDER — CARVEDILOL 3.125 MG PO TABS
3.1250 mg | ORAL_TABLET | Freq: Two times a day (BID) | ORAL | Status: DC
  Administered 2019-05-31 – 2019-06-03 (×6): 3.125 mg via ORAL
  Filled 2019-05-31 (×6): qty 1

## 2019-05-31 MED ORDER — METHYLPREDNISOLONE SODIUM SUCC 40 MG IJ SOLR
40.0000 mg | Freq: Every day | INTRAMUSCULAR | Status: DC
  Administered 2019-06-01 – 2019-06-03 (×3): 40 mg via INTRAVENOUS
  Filled 2019-05-31 (×3): qty 1

## 2019-05-31 MED ORDER — FUROSEMIDE 10 MG/ML IJ SOLN
60.0000 mg | Freq: Once | INTRAMUSCULAR | Status: AC
  Administered 2019-05-31: 16:00:00 60 mg via INTRAVENOUS
  Filled 2019-05-31: qty 6

## 2019-05-31 NOTE — Progress Notes (Signed)
Pt. Had 3 beats of v-tach and some non-conductive PACs.  Pt. Remains asymptomatic.  Dr. Shanon Brow page.

## 2019-05-31 NOTE — Progress Notes (Signed)
Pt had 5 beat run of vtach. Notified David MD. Magnesium ordered. VSS. Will continue to monitor.

## 2019-05-31 NOTE — Progress Notes (Signed)
PROGRESS NOTE                                                                                                                                                                                                             Patient Demographics:    Kristopher [name], is a 73 y.o. 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  As well found to be in renal failure, with a creatinine of 4, and he is with significant dysphagia as well, he was transferred to [facility] for further management.   Subjective:    Candyce Churn today ports he is feeling better, dyspnea has improved, as well his cough is less productive currently, no chest pain, nausea, or vomiting    Assessment  & Plan :    Active Problems:   COVID-19 virus infection   AKI (acute kidney injury) ([place])   Acute hypoxic respiratory failure secondary to COVID-19 for pneumonia -Patient patient hypoxic 88% on presentation, this morning he is on 4 L nasal cannula, continue to wean as tolerated. -He was encouraged to use incentive spirometry and flutter valve -Imaging significant for bilateral opacities.  As well hypoxic, started on IV Remdesivir 8/13 -Sputum culture growing H influenza, started on Rocephin, to finish total of 5 days -Continue with IV steroids, will start tapering -Continue to trend inflammatory markers, CRP is trending up today, continue to monitor. -So far we will hold on Actemra and giving improvement respiratory status, and he is  with known history of splenectomy. -Continue with zinc and vitamin C - leukocytosis in the steroids, afebrile, nontoxic-appearing   COVID-19 Labs  Recent Labs    05/29/19 0310 05/30/19 0445 05/31/19 0312  DDIMER 1.20* 0.90*  --   FERRITIN 211 203  --   CRP 4.6* 7.6* 5.1*    Lab Results  Component Value Date  SARSCOV2NAA POSITIVE (A) 05/25/2019   AKI -In the setting of solitary kidney, status post left nephrectomy(traumatic) -This is most likely  prerenal, improving with IV fluids, hold nephrotoxic medication -Renal failure improving, creatinine of 4 on admission, significantly improved.  Continue to monitor closely as on IV diuresis  Dysphagia -Seen by SLP, advanced to regular diet  Hyperkalemia -The setting of renal failure, resolved  Bradycardia -Have stopped carvedilol, resolved, currently heart rate in the 80s to 90s, he will be started on low-dose Coreg specially he had few beats of V. tach today. -EKG withLBBB,appears to be old, denies any chest pain  Hypertension/hypotension -Blood pressure on the lower side on admission, antihypertensive medications has been held -Blood pressure started to increase, started on Norvasc, remains elevated, given heart rate now in the 80s he will be resumed on low-dose Norvasc.  Protein calorie malnutrition -Continue with supplement, nutritionist consulted  Elevated lipase Lipase on presentation 127, trended down to 43  History of splenectomy High risk for bacterial infection  Chronic anxiety/depression Continue home medications  Hyperlipidemia Continue statin No transaminitis Continue to monitor LFTs  Hypophosphatemia -Started on Neutra-Phos  Tobacco use disorder Tobacco cessation counseling at bedside. Nicotine patch as needed  Code Status : Full code  Family Communication  : Discussed with son Cristie Hem per patient request [phone] on 8/15  Disposition Plan  : Home  Barriers For Discharge : Remains on  oxygen, fluids, Remdesivir and steroids  Consults  :  none  Procedures  : none  DVT Prophylaxis  : Subcu heparin  Lab Results  Component Value Date   PLT 331 05/31/2019    Antibiotics  :    Anti-infectives (From admission, onward)   Start     Dose/Rate Route Frequency Ordered Stop   05/30/19 1000  cefTRIAXone (ROCEPHIN) 1 g in sodium chloride 0.9 % 100 mL IVPB     1 g 200 mL/hr over 30 Minutes Intravenous Every 24 hours 05/30/19 0951 06/04/19 0959   05/26/19 1700  remdesivir 100 mg in sodium chloride 0.9 % 250 mL IVPB     100 mg 500 mL/hr over 30 Minutes Intravenous Every 24 hours 05/25/19 1520 05/29/19 1649   05/25/19 1700  remdesivir 200 mg in sodium chloride 0.9 % 250 mL IVPB     200 mg 500 mL/hr over 30 Minutes Intravenous Once 05/25/19 1520 05/25/19 1725        Objective:   Vitals:   05/31/19 0417 05/31/19 0740 05/31/19 0849 05/31/19 1132  BP: (!) 149/73 (!) 142/80 138/85 138/76  Pulse: 71 71    Resp: 20 (!) 23    Temp: 97.8 F (36.6 C) 97.7 F (36.5 C)  98.8 F (37.1 C)  TempSrc: Oral Oral    SpO2: 91% 92%    Weight:      Height:        Wt Readings from Last 3 Encounters:  05/27/19 61.3 kg  02/12/18 66.1 kg  04/17/17 63 kg     Intake/Output Summary (Last 24 hours) at 05/31/2019 1503 Last data filed at 05/31/2019 1033 Gross per 24 hour  Intake 820 ml  Output 2900 ml  Net -2080 ml     Physical Exam  Awake Alert, Oriented X 3, No new F.N deficits, Normal affect, patient is frail, thin appearing Symmetrical Chest wall movement, Good air movement bilaterally, CTAB RRR,No Gallops,Rubs or new Murmurs, No Parasternal Heave +ve B.Sounds, Abd Soft, No tenderness, No rebound - guarding or rigidity. No Cyanosis, Clubbing or edema, No  new Rash or bruise       Data Review:    CBC Recent Labs  Lab 05/26/19 0105 05/27/19 0326 05/28/19 0140 05/29/19 0310 05/30/19 0445 05/31/19 0312  WBC 9.3 20.0* 16.8* 18.2* 11.5* 9.4  HGB 14.1 14.4 14.1 15.4  14.2 16.2  HCT 45.6 44.8 43.6 47.9 42.9 48.1  PLT 419* 358 344 351 311 331  MCV 94.6 92.9 92.2 91.2 88.8 87.5  MCH 29.3 29.9 29.8 29.3 29.4 29.5  MCHC 30.9 32.1 32.3 32.2 33.1 33.7  RDW 14.6 14.7 14.9 14.8 14.5 14.1  LYMPHSABS 0.4* 0.7 0.4* 0.5* 0.3*  --   MONOABS 0.1 0.5 0.8 2.1* 0.9  --   EOSABS 0.0 0.0 0.0 0.0 0.0  --   BASOSABS 0.0 0.0 0.0 0.0 0.0  --     Chemistries  Recent Labs  Lab 05/27/19 0326 05/28/19 0140 05/29/19 0310 05/30/19 0445 05/30/19 2110 05/31/19 0312  NA 145 143 145 145  --  141  K 4.2 4.2 4.3 4.4  --  4.3  CL 112* 112* 112* 109  --  96*  CO2 23 25 24 27   --  33*  GLUCOSE 121* 157* 121* 130*  --  152*  BUN 45* 51* 51* 50*  --  55*  CREATININE 1.68* 1.37* 1.09 1.13  --  1.12  CALCIUM 8.5* 8.3* 8.5* 8.3*  --  8.7*  MG 2.3 2.2 2.0 1.9 1.9  --   AST 13* 16 19 16   --  16  ALT 13 14 17 18   --  21  ALKPHOS 68 66 66 66  --  69  BILITOT 0.4 0.4 0.5 0.8  --  0.9   ------------------------------------------------------------------------------------------------------------------ Recent Labs    05/29/19 0310 05/30/19 0445  TRIG 64 19    No results found for: HGBA1C ------------------------------------------------------------------------------------------------------------------ No results for input(s): TSH, T4TOTAL, T3FREE, THYROIDAB in the last 72 hours.  Invalid input(s): FREET3 ------------------------------------------------------------------------------------------------------------------ Recent Labs    05/29/19 0310 05/30/19 0445  FERRITIN 211 203    Coagulation profile No results for input(s): INR, PROTIME in the last 168 hours.  Recent Labs    05/29/19 0310 05/30/19 0445  DDIMER 1.20* 0.90*    Cardiac Enzymes No results for input(s): CKMB, TROPONINI, MYOGLOBIN in the last 168 hours.  Invalid input(s): CK ------------------------------------------------------------------------------------------------------------------      Component Value Date/Time   BNP 458.9 (H) 05/31/2019 0312    Inpatient Medications  Scheduled Meds:  amLODipine  10 mg Oral Daily   carbamide peroxide  10 drop Both EARS BID   carvedilol  3.125 mg Oral BID WC   Chlorhexidine Gluconate Cloth  6 each Topical Daily   cholecalciferol  1,000 Units Oral Daily   enoxaparin (LOVENOX) injection  40 mg Subcutaneous Q24H   feeding supplement (ENSURE ENLIVE)  237 mL Oral BID BM   FLUoxetine  20 mg Oral QHS   furosemide  60 mg Intravenous Once   guaiFENesin  1,200 mg Oral BID   ipratropium  2 puff Inhalation Q8H   levalbuterol  2 puff Inhalation Q8H   loratadine  10 mg Oral Daily   methylPREDNISolone (SOLU-MEDROL) injection  60 mg Intravenous Q12H   nicotine  7 mg Transdermal QHS   pantoprazole  40 mg Oral Daily   phosphorus  500 mg Oral Daily   polyethylene glycol  17 g Oral QHS   simvastatin  20 mg Oral QHS   tamsulosin  0.4 mg Oral QPC supper   vitamin C  500 mg  Oral Daily   zinc sulfate  220 mg Oral Daily   Continuous Infusions:  cefTRIAXone (ROCEPHIN)  IV 1 g (05/31/19 0854)   PRN Meds:.acetaminophen **OR** acetaminophen, ALPRAZolam, chlorpheniramine-HYDROcodone, guaiFENesin-dextromethorphan, nicotine polacrilex, ondansetron (ZOFRAN) IV, oxyCODONE  Micro Results Recent Results (from the past 240 hour(s))  Blood Culture (routine x 2)     Status: None   Collection Time: 05/25/19 10:22 AM   Specimen: BLOOD  Result Value Ref Range Status   Specimen Description   Final    BLOOD LEFT ANTECUBITAL Performed at [facility] [address] [place], [place] [address]    Special Requests   Final    BOTTLES DRAWN AEROBIC AND ANAEROBIC Blood Culture adequate volume Performed at [facility] [address], [place], [place] [address]    Culture   Final    NO GROWTH 5 DAYS Performed at [facility], [place] [address] [place], [place] [address]    Report Status  05/30/2019 FINAL  Final  Blood Culture (routine x 2)     Status: None   Collection Time: 05/25/19 10:22 AM   Specimen: BLOOD LEFT FOREARM  Result Value Ref Range Status   Specimen Description   Final    BLOOD LEFT FOREARM Performed at [facility], [place] [address] [place], [place] [address]    Special Requests   Final    BOTTLES DRAWN AEROBIC AND ANAEROBIC Blood Culture adequate volume Performed at [facility] [address], [place], [place] [address]    Culture   Final    NO GROWTH 5 DAYS Performed at [facility], [place] [address] [place], [place] [address]    Report Status 05/30/2019 FINAL  Final  SARS Coronavirus 2 [facility] order, Performed in [facility] hospital lab) Nasopharyngeal Nasopharyngeal Swab     Status: Abnormal   Collection Time: 05/25/19 10:58 AM   Specimen: Nasopharyngeal Swab  Result Value Ref Range Status   SARS Coronavirus 2 POSITIVE (A) NEGATIVE Final    Comment: RESULT CALLED TO, READ BACK BY AND VERIFIED WITH: TIM SMITH,RN IN:[id number] @ [id number] BY J SCOTTON (NOTE) If result is NEGATIVE SARS-CoV-2 target nucleic acids are NOT DETECTED. The SARS-CoV-2 RNA is generally detectable in upper and lower  respiratory specimens during the acute phase of infection. The lowest  concentration of SARS-CoV-2 viral copies this assay can detect is 250  copies / mL. A negative result does not preclude SARS-CoV-2 infection  and should not be used as the sole basis for treatment or other  patient management decisions.  A negative result may occur with  improper specimen collection / handling, submission of specimen other  than nasopharyngeal swab, presence of viral mutation(s) within the  areas targeted by this assay, and inadequate number of viral copies  (<250 copies / mL). A negative result must be combined with clinical  observations, patient history, and epidemiological information. If result is POSITIVE SARS-CoV-2 target nucleic acids are  DETECTED . The SARS-CoV-2 RNA is generally detectable in upper and lower  respiratory specimens during the acute phase of infection.  Positive  results are indicative of active infection with SARS-CoV-2.  Clinical  correlation with patient history and other diagnostic information is  necessary to determine patient infection status.  Positive results do  not rule out bacterial infection or co-infection with other viruses. If result is PRESUMPTIVE POSTIVE SARS-CoV-2 nucleic acids MAY BE PRESENT.   A presumptive positive result was obtained on the  submitted specimen  and confirmed on repeat testing.  While 2019 novel coronavirus  (SARS-CoV-2) nucleic acids may be present in the submitted sample  additional confirmatory testing may be necessary for epidemiological  and / or clinical management purposes  to differentiate between  SARS-CoV-2 and other Sarbecovirus currently known to infect humans.  If clinically indicated additional testing with an alternate test  methodology [phone] ) is advised. The SARS-CoV-2 RNA is generally  detectable in upper and lower respiratory specimens during the acute  phase of infection. The expected result is Negative. Fact Sheet for Patients:  StrictlyIdeas.no Fact Sheet for Healthcare Providers: BankingDealers.co.za This test is not yet approved or cleared by the [place] FDA and has been authorized for detection and/or diagnosis of SARS-CoV-2 by FDA under an Emergency Use Authorization (EUA).  This EUA will remain in effect (meaning this test can be used) for the duration of the COVID-19 declaration under Section 564(b)(1) of the Act, 21 U.S.C. section 360bbb-3(b)(1), unless the authorization is terminated or revoked sooner. Performed at [facility], [place] [address] [place], [place] [address]   Urine culture     Status: None   Collection Time: 05/25/19 10:52 PM   Specimen: Urine,  Random  Result Value Ref Range Status   Specimen Description   Final    URINE, RANDOM Performed at [facility] [address], [place], [place] [address]    Special Requests   Final    NONE Performed at [facility], [place] [address] [place], [place] [address]    Culture   Final    NO GROWTH Performed at [facility], [place] [address], [place], [place] [address]    Report Status 05/26/2019 FINAL  Final  Expectorated sputum assessment w rflx to resp cult     Status: None   Collection Time: 05/26/19  1:07 AM   Specimen: Sputum  Result Value Ref Range Status   Specimen Description SPUTUM  Final   Special Requests NONE  Final   Sputum evaluation   Final    THIS SPECIMEN IS ACCEPTABLE FOR SPUTUM CULTURE Performed at [facility], [place] [address], [place], [place] [address]    Report Status 05/26/2019 FINAL  Final  Culture, respiratory     Status: None   Collection Time: 05/26/19  1:07 AM   Specimen: SPU  Result Value Ref Range Status   Specimen Description   Final    SPUTUM Performed at [facility] [address], [place], [place] [address]    Special Requests   Final    NONE Reflexed from [id number] Performed at [facility], [place] [address] [place], [place] [address]    Gram Stain   Final    RARE WBC PRESENT, PREDOMINANTLY PMN FEW GRAM NEGATIVE RODS FEW GRAM POSITIVE COCCI IN CLUSTERS FEW GRAM NEGATIVE COCCOBACILLI    Culture   Final    MODERATE HAEMOPHILUS INFLUENZAE BETA LACTAMASE POSITIVE Performed at [facility], [address] [place], [place] [address]    Report Status 05/28/2019 FINAL  Final    Radiology Reports Ct Abdomen Pelvis Wo Contrast  Result Date: 05/25/2019 CLINICAL DATA:  73 year old male with history of smoking presenting with nausea vomiting and diarrhea. Patient was found to be hypoxic and COVID-19 positive. EXAM: CT ABDOMEN AND PELVIS  WITHOUT CONTRAST TECHNIQUE: Multidetector CT imaging of the abdomen and pelvis was performed following the standard protocol without IV contrast. COMPARISON:  CT of the abdomen pelvis dated 08/21/2012. Chest radiograph dated 05/25/2019 FINDINGS: Evaluation of this exam is limited in the absence of intravenous contrast. Evaluation is also limited due to streak artifact caused by patient's arms as well as respiratory motion artifact. Lower chest: There is emphysematous changes of the lungs. Left lung base densities, new since the study of 2013 and concerning for superimposed infectious process. Clinical correlation is recommended. There is multi vessel coronary vascular calcification. No intra-abdominal free air or free fluid. Hepatobiliary: The liver is unremarkable. No intrahepatic biliary ductal dilatation. Cholecystectomy. Pancreas: Unremarkable. No pancreatic ductal dilatation or surrounding inflammatory changes. Spleen: Status post prior splenectomy with multiple residual splenic tissue. Adrenals/Urinary Tract: The adrenal glands are unremarkable. Postsurgical changes of prior left nephrectomy. Several right renal lesions which are not characterized on this unenhanced CT. The largest lesion in the upper pole of the right kidney measures up to 3.3 cm and demonstrates a fluid attenuation most consistent with a cyst. Several smaller high attenuating lesions are not characterized but may represent complex/hemorrhagic cysts. Ultrasound may provide better characterization. There is no hydronephrosis or nephrolithiasis. Renal vascular calcifications noted. The right ureter and urinary bladder appear unremarkable. Stomach/Bowel: There is no bowel obstruction or active inflammation. The appendix is not visualized with certainty. No inflammatory changes identified in the right lower quadrant. Vascular/Lymphatic: There is advanced atherosclerotic calcification of the aorta and iliac arteries. The IVC is unremarkable. No  portal venous gas. There is no adenopathy. Reproductive: Mildly enlarged prostate gland measuring approximately 4.3 cm in transverse axial diameter. Other: Midline vertical anterior pelvic wall incisional scar. There is a small fat containing ventral hernia the left of the midline. The neck of the hernia defect measures approximately 17 mm in transverse axial diameter. There is small amount of fluid and mild stranding of the herniated fat. Correlation with clinical exam is recommended to exclude strangulation or incarceration. No herniated bowel identified. Musculoskeletal: Advanced osteopenia. Multilevel degenerative changes of the spine with enthesopathy and findings consistent with ankylosing spondylitis. No acute fracture. IMPRESSION: 1. Increased left lung base densities concerning for developing infiltrate. Clinical correlation is recommended. 2. No acute intra-abdominal or pelvic pathology. No bowel obstruction or active inflammation. 3. Small fat containing ventral hernia with mild stranding of the herniated fat. Correlation with clinical exam is recommended to exclude strangulation or incarceration. No herniated bowel. 4. Aortic Atherosclerosis (ICD10-I70.0) and Emphysema (ICD10-J43.9). Electronically Signed   By: Anner Crete M.D.   On: 05/25/2019 21:43   Dg Chest Port 1 View  Result Date: 05/30/2019 CLINICAL DATA:  Hypoxia. EXAM: PORTABLE CHEST 1 VIEW COMPARISON:  05/26/2019. FINDINGS: The heart remains normal in size. Diffusely increased prominence of the interstitial markings in the right lung with no significant change in diffuse prominence of the interstitial markings in the left lung. Small left pleural effusion with improvement. Diffuse osteopenia. Left upper quadrant abdominal surgical clips. IMPRESSION: Mildly progressive diffuse interstitial prominence on the right, stable on the left. Differential considerations include viral pneumonitis, drug reaction and pulmonary edema. Electronically  Signed   By: Claudie Revering M.D.   On: 05/30/2019 08:18   Dg Chest Port 1 View  Result Date: 05/26/2019 CLINICAL DATA:  Hypoxia EXAM: PORTABLE CHEST 1 VIEW COMPARISON:  CT 04/12/2014, radiograph 05/25/2019. FINDINGS: Worsening interstitial opacities throughout both lungs, favored to reflect edema on a background of emphysematous change seen on prior CT. Apical pleuroparenchymal scarring is present. Abundant pericardial fat pad in the left lung base. The heart appears slightly enlarged from  prior portable technique as do the central pulmonary arteries. Surgical clips are present in the left upper quadrant. Bones are diffusely demineralized. No acute osseous or soft tissue abnormality. IMPRESSION: Worsening interstitial opacities throughout both lungs, favored to reflect worsening edema on a background of emphysematous change seen on prior CT. Electronically Signed   By: Lovena Le M.D.   On: 05/26/2019 14:33   Dg Chest Port 1 View  Result Date: 05/25/2019 CLINICAL DATA:  Increased shortness of breath EXAM: PORTABLE CHEST 1 VIEW COMPARISON:  04/11/2019 FINDINGS: Cardiac shadow is stable. Aortic calcifications are noted. Increased interstitial markings are seen particularly on the left likely related to underlying edema. No sizable effusion is seen. No bony abnormality is noted. IMPRESSION: Increasing interstitial changes likely related to underlying edema. Electronically Signed   By: Inez Catalina M.D.   On: 05/25/2019 10:46    Phillips Climes M.D on 05/31/2019 at 3:03 PM  Between 7am to 7pm - Pager - [phone]  After 7pm go to [email or web address] - password [facility]  [facility] -  Office  [phone]

## 2019-06-01 ENCOUNTER — Inpatient Hospital Stay (HOSPITAL_COMMUNITY): Payer: Medicare Other

## 2019-06-01 LAB — COMPREHENSIVE METABOLIC PANEL
ALT: 20 U/L (ref 0–44)
AST: 17 U/L (ref 15–41)
Albumin: 2.9 g/dL — ABNORMAL LOW (ref 3.5–5.0)
Alkaline Phosphatase: 65 U/L (ref 38–126)
Anion gap: 12 (ref 5–15)
BUN: 56 mg/dL — ABNORMAL HIGH (ref 8–23)
CO2: 33 mmol/L — ABNORMAL HIGH (ref 22–32)
Calcium: 8.7 mg/dL — ABNORMAL LOW (ref 8.9–10.3)
Chloride: 97 mmol/L — ABNORMAL LOW (ref 98–111)
Creatinine, Ser: 1.22 mg/dL (ref 0.61–1.24)
GFR calc Af Amer: >60 mL/min (ref >60)
GFR calc non Af Amer: 58 mL/min — ABNORMAL LOW (ref >60)
Glucose, Bld: 153 mg/dL — ABNORMAL HIGH (ref 70–99)
Potassium: 4 mmol/L (ref 3.5–5.1)
Sodium: 142 mmol/L (ref 135–145)
Total Bilirubin: 0.8 mg/dL (ref 0.3–1.2)
Total Protein: 6.5 g/dL (ref 6.5–8.1)

## 2019-06-01 LAB — C-REACTIVE PROTEIN: CRP: 2.7 mg/dL — ABNORMAL HIGH (ref <1.0)

## 2019-06-01 LAB — CBC
HCT: 47.7 % (ref 39.0–52.0)
Hemoglobin: 16.2 g/dL (ref 13.0–17.0)
MCH: 29.8 pg (ref 26.0–34.0)
MCHC: 34 g/dL (ref 30.0–36.0)
MCV: 87.8 fL (ref 80.0–100.0)
Platelets: 333 10*3/uL (ref 150–400)
RBC: 5.43 MIL/uL (ref 4.22–5.81)
RDW: 14.5 % (ref 11.5–15.5)
WBC: 12.1 10*3/uL — ABNORMAL HIGH (ref 4.0–10.5)
nRBC: 0 % (ref 0.0–0.2)

## 2019-06-01 LAB — MAGNESIUM: Magnesium: 1.9 mg/dL (ref 1.7–2.4)

## 2019-06-01 LAB — PROCALCITONIN: Procalcitonin: <0.1 ng/mL

## 2019-06-01 NOTE — Progress Notes (Addendum)
Physical Therapy Evaluation Patient Details Name: Kristopher [name] MRN: [id number] DOB: [birth year] Today's Date: 06/01/2019   History of Present Illness  73 y.o. male admitted on 05/25/19 for SOB, weakness, emesis.  Pt dx with COVID 19 PNA, AKI, hyperkalemia, bradycardia, hypotension.  Pt with significant PMH of HOH (R ear better than L), HTN, depression, cardiomyopathy.    Clinical Impression  Pt is generally weak and debilitated from a week in the hospital.  He reports this is his first walk further than the chair.  He needed min hand held assist for balance and O2 sats decreased to 87% on 3 L O2 [place].  He took one standing rest break for DOE 2/4 during gait.  O2 sats decreased to 83% on RA at rest.   He is adamant he is going home tomorrow and is agreeable to home therapy.  [facility] services would be advisable as well as home O2.      Follow Up Recommendations Home health PT;Other (comment)(HHOT and aide if he can)    Equipment Recommendations  Other (comment);3in1 (PT)(home O2)    Recommendations for Other Services OT consult     Precautions / Restrictions Precautions Precautions: Fall;Other (comment) Precaution Comments: monitor O2      Mobility  Bed Mobility Overal bed mobility: Modified Independent             General bed mobility comments: slow, extra time  Transfers Overall transfer level: Needs assistance Equipment used: 1 person hand held assist Transfers: Sit to/from Stand Sit to Stand: Min assist         General transfer comment: Min hand held assist to come to standing for balance.   Ambulation/Gait Ambulation/Gait assistance: Min assist Gait Distance (Feet): 65 Feet Assistive device: 1 person hand held assist Gait Pattern/deviations: Step-through pattern;Staggering left;Staggering right Gait velocity: decreased   General Gait Details: Pt needs min assist for balance for gait down the hallway.  He reports a RW cannot fit around his tight trailer, he can  only use a cane at home.  His O2 sats decreased to 87% on 3 L O2 [place] with finger probe nellcor      Balance Overall balance assessment: Needs assistance Sitting-balance support: Feet supported;No upper extremity supported Sitting balance-Leahy Scale: Good     Standing balance support: Single extremity supported Standing balance-Leahy Scale: Poor Standing balance comment: needs external assist in standing.                              Pertinent Vitals/Pain Pain Assessment: No/denies pain    Home Living Family/patient expects to be discharged to:: Private residence Living Arrangements: Alone Available Help at Discharge: Friend(s);Available PRN/intermittently Type of Home: Mobile home Home Access: Stairs to enter Entrance Stairs-Rails: None Entrance Stairs-Number of Steps: 2 Home Layout: One level Home Equipment: Cane - single point      Prior Function Level of Independence: Independent         Comments: Pt reports he still works on [employer] "when I want to" and likes to go fishing        Extremity/Trunk Assessment   Upper Extremity Assessment Upper Extremity Assessment: Defer to OT evaluation    Lower Extremity Assessment Lower Extremity Assessment: Generalized weakness    Cervical / Trunk Assessment Cervical / Trunk Assessment: Other exceptions Cervical / Trunk Exceptions: forward head  Communication   Communication: HOH(R ear is better than L ear)  Cognition Arousal/Alertness: Awake/alert  Behavior During Therapy: WFL for tasks assessed/performed Overall Cognitive Status: No family/caregiver present to determine baseline cognitive functioning                                 General Comments: Conversation normal, low health IQ as I discussed with him why he could not go to the grocery store at d/c because he is COVID positive.       General Comments General comments (skin integrity, edema, etc.): Pt has not walked more than to  the chair the entire time he has been admitted (7 days)        Assessment/Plan    PT Assessment Patient needs continued PT services  PT Problem List Decreased strength;Decreased activity tolerance;Decreased balance;Decreased mobility;Decreased knowledge of use of DME;Decreased knowledge of precautions;Cardiopulmonary status limiting activity       PT Treatment Interventions DME instruction;Gait training;Stair training;Functional mobility training;Therapeutic activities;Therapeutic exercise;Balance training;Patient/family education    PT Goals (Current goals can be found in the Care Plan section)  Acute Rehab PT Goals Patient Stated Goal: to go home tomorrow PT Goal Formulation: With patient Time For Goal Achievement: 06/15/19 Potential to Achieve Goals: Good    Frequency Min 3X/week    AM-PAC PT "6 Clicks" Mobility  Outcome Measure Help needed turning from your back to your side while in a flat bed without using bedrails?: A Little Help needed moving from lying on your back to sitting on the side of a flat bed without using bedrails?: A Little Help needed moving to and from a bed to a chair (including a wheelchair)?: A Little Help needed standing up from a chair using your arms (e.g., wheelchair or bedside chair)?: A Little Help needed to walk in hospital room?: A Little Help needed climbing 3-5 steps with a railing? : A Little 6 Click Score: 18    End of Session Equipment Utilized During Treatment: Gait belt;Oxygen Activity Tolerance: Patient limited by fatigue Patient left: in chair;with call bell/phone within reach   PT Visit Diagnosis: Muscle weakness (generalized) (M62.81);Difficulty in walking, not elsewhere classified (R26.2);Unsteadiness on feet (R26.81)    Time: [id number] PT Time Calculation (min) (ACUTE ONLY): 28 min   Charges:   PT Evaluation $PT Eval Moderate Complexity: 1 Mod PT Treatments $Gait Training: 8-22 mins        Perez Dirico B. Dustine Stickler, PT,  DPT  Acute Rehabilitation [phone] pager [phone]) [phone] office  @ [facility]: [phone]

## 2019-06-01 NOTE — Progress Notes (Signed)
PROGRESS NOTE                                                                                                                                                                                                             Patient Demographics:    Kristopher [name], is a 73 y.o. 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  As well found to be in renal failure, with a creatinine of 4, and he is with significant dysphagia as well, he was transferred to [facility] for further management.   Subjective:    Kristopher [name] today reports dyspnea has improved, cough less productive, denies any chest pain, nausea or vomiting, reports his appetite has improved .   Assessment  & Plan :    Active Problems:   COVID-19 virus infection   AKI (acute kidney injury) ([place])   Acute hypoxic respiratory failure secondary to COVID-19 for pneumonia -Patient  hypoxic 88% on presentation, this morning he is on 6 L nasal cannula, I was able to decrease to 4 L nasal cannula . -He was encouraged to use incentive spirometry and flutter valve -Imaging significant for bilateral opacities.  As well hypoxic, treated IV Remdesivir 8/13 -Sputum culture growing H influenza, started on Rocephin 8/17, to finish total of 5 days -Continue with IV steroids -Continue to trend inflammatory markers, CRP is trending , continue to monitor. -So far we will hold on Actemra and giving improvement respiratory status, and he is with known history  of splenectomy. -Continue with zinc and vitamin C - leukocytosis in the steroids, afebrile, nontoxic-appearing -Continue with IV Lasix as needed   COVID-19 Labs  Recent Labs    05/30/19 0445 05/31/19 0312 06/01/19 0330  DDIMER 0.90*  --   --   FERRITIN 203  --   --   CRP 7.6* 5.1* 2.7*  Lab Results  Component Value Date   SARSCOV2NAA POSITIVE (A) 05/25/2019   AKI -In the setting of solitary kidney, status post left nephrectomy(traumatic) -This is most likely  prerenal, improving with IV fluids, hold nephrotoxic medication -Renal failure improving, creatinine of 4 on admission, significantly improved.  Continue to monitor closely as on IV diuresis  Dysphagia -Seen by SLP, advanced to regular diet  Hyperkalemia -The setting of renal failure, resolved  Bradycardia -Have stopped carvedilol, resolved, currently heart rate in the 80s to 90s, he will be started on low-dose Coreg specially he had few beats of V. tach today. -EKG withLBBB,appears to be old, denies any chest pain  Hypertension/hypotension -Blood pressure on the lower side on admission, antihypertensive medications has been held -Blood pressure started to increase, started on Norvasc, remains elevated, given heart rate now in the 80s he was resumed on low-dose Norvasc.  Protein calorie malnutrition -Continue with supplement, nutritionist consulted  Elevated lipase Lipase on presentation 127, trended down to 43  History of splenectomy High risk for bacterial infection  Chronic anxiety/depression Continue home medications  Hyperlipidemia Continue statin No transaminitis Continue to monitor LFTs  Hypophosphatemia -Started on Neutra-Phos  Tobacco use disorder Tobacco cessation counseling at bedside. Nicotine patch as needed  Code Status : Full code  Family Communication  : Discussed with son Cristie Hem per patient request [phone] on 8/15  Disposition Plan  : Home  Barriers For Discharge :  Remains on oxygen, fluids, Remdesivir and steroids  Consults  :  none  Procedures  : none  DVT Prophylaxis  : Subcu heparin  Lab Results  Component Value Date   PLT 333 06/01/2019    Antibiotics  :    Anti-infectives (From admission, onward)   Start     Dose/Rate Route Frequency Ordered Stop   05/30/19 1000  cefTRIAXone (ROCEPHIN) 1 g in sodium chloride 0.9 % 100 mL IVPB     1 g 200 mL/hr over 30 Minutes Intravenous Every 24 hours 05/30/19 0951 06/04/19 0959   05/26/19 1700  remdesivir 100 mg in sodium chloride 0.9 % 250 mL IVPB     100 mg 500 mL/hr over 30 Minutes Intravenous Every 24 hours 05/25/19 1520 05/29/19 1649   05/25/19 1700  remdesivir 200 mg in sodium chloride 0.9 % 250 mL IVPB     200 mg 500 mL/hr over 30 Minutes Intravenous Once 05/25/19 1520 05/25/19 1725        Objective:   Vitals:   06/01/19 0500 06/01/19 0600 06/01/19 0751 06/01/19 1156  BP: (!) 143/71 112/73 (!) 143/84 126/75  Pulse:   66 61  Resp:   (!) 25 (!) 30  Temp:   (!) 97.5 F (36.4 C) (!) 96.5 F (35.8 C)  TempSrc:   Oral Axillary  SpO2:   98% 90%  Weight:      Height:        Wt Readings from Last 3 Encounters:  05/27/19 61.3 kg  02/12/18 66.1 kg  04/17/17 63 kg     Intake/Output Summary (Last 24 hours) at 06/01/2019 1557 Last data filed at 06/01/2019 1438 Gross per 24 hour  Intake 1560 ml  Output 2275 ml  Net -715 ml     Physical Exam  Awake Alert, Oriented X 3, No new F.N deficits, Normal affect Symmetrical Chest wall movement, Good air movement bilaterally, CTAB RRR,No Gallops,Rubs or new Murmurs, No Parasternal Heave +ve B.Sounds, Abd Soft, No tenderness, No rebound - guarding or rigidity. No Cyanosis, Clubbing or  edema, No new Rash or bruise         Data Review:    CBC Recent Labs  Lab 05/26/19 0105 05/27/19 0326 05/28/19 0140 05/29/19 0310 05/30/19 0445 05/31/19 0312 06/01/19 0330  WBC 9.3 20.0* 16.8* 18.2* 11.5* 9.4 12.1*  HGB 14.1 14.4 14.1 15.4  14.2 16.2 16.2  HCT 45.6 44.8 43.6 47.9 42.9 48.1 47.7  PLT 419* 358 344 351 311 331 333  MCV 94.6 92.9 92.2 91.2 88.8 87.5 87.8  MCH 29.3 29.9 29.8 29.3 29.4 29.5 29.8  MCHC 30.9 32.1 32.3 32.2 33.1 33.7 34.0  RDW 14.6 14.7 14.9 14.8 14.5 14.1 14.5  LYMPHSABS 0.4* 0.7 0.4* 0.5* 0.3*  --   --   MONOABS 0.1 0.5 0.8 2.1* 0.9  --   --   EOSABS 0.0 0.0 0.0 0.0 0.0  --   --   BASOSABS 0.0 0.0 0.0 0.0 0.0  --   --     Chemistries  Recent Labs  Lab 05/28/19 0140 05/29/19 0310 05/30/19 0445 05/30/19 2110 05/31/19 0312 05/31/19 2310 06/01/19 0330  NA 143 145 145  --  141  --  142  K 4.2 4.3 4.4  --  4.3  --  4.0  CL 112* 112* 109  --  96*  --  97*  CO2 25 24 27   --  33*  --  33*  GLUCOSE 157* 121* 130*  --  152*  --  153*  BUN 51* 51* 50*  --  55*  --  56*  CREATININE 1.37* 1.09 1.13  --  1.12  --  1.22  CALCIUM 8.3* 8.5* 8.3*  --  8.7*  --  8.7*  MG 2.2 2.0 1.9 1.9  --  1.9  --   AST 16 19 16   --  16  --  17  ALT 14 17 18   --  21  --  20  ALKPHOS 66 66 66  --  69  --  65  BILITOT 0.4 0.5 0.8  --  0.9  --  0.8   ------------------------------------------------------------------------------------------------------------------ Recent Labs    05/30/19 0445  TRIG 19    No results found for: HGBA1C ------------------------------------------------------------------------------------------------------------------ No results for input(s): TSH, T4TOTAL, T3FREE, THYROIDAB in the last 72 hours.  Invalid input(s): FREET3 ------------------------------------------------------------------------------------------------------------------ Recent Labs    05/30/19 0445  FERRITIN 203    Coagulation profile No results for input(s): INR, PROTIME in the last 168 hours.  Recent Labs    05/30/19 0445  DDIMER 0.90*    Cardiac Enzymes No results for input(s): CKMB, TROPONINI, MYOGLOBIN in the last 168 hours.  Invalid input(s):  CK ------------------------------------------------------------------------------------------------------------------    Component Value Date/Time   BNP 458.9 (H) 05/31/2019 0312    Inpatient Medications  Scheduled Meds:  amLODipine  10 mg Oral Daily   carvedilol  3.125 mg Oral BID WC   Chlorhexidine Gluconate Cloth  6 each Topical Daily   cholecalciferol  1,000 Units Oral Daily   enoxaparin (LOVENOX) injection  40 mg Subcutaneous Q24H   feeding supplement (ENSURE ENLIVE)  237 mL Oral BID BM   FLUoxetine  20 mg Oral QHS   guaiFENesin  1,200 mg Oral BID   ipratropium  2 puff Inhalation Q8H   levalbuterol  2 puff Inhalation Q8H   loratadine  10 mg Oral Daily   methylPREDNISolone (SOLU-MEDROL) injection  40 mg Intravenous Daily   nicotine  7 mg Transdermal QHS   pantoprazole  40 mg Oral Daily  phosphorus  500 mg Oral Daily   polyethylene glycol  17 g Oral QHS   simvastatin  20 mg Oral QHS   tamsulosin  0.4 mg Oral QPC supper   vitamin C  500 mg Oral Daily   zinc sulfate  220 mg Oral Daily   Continuous Infusions:  cefTRIAXone (ROCEPHIN)  IV 1 g (06/01/19 1051)   PRN Meds:.acetaminophen **OR** acetaminophen, ALPRAZolam, chlorpheniramine-HYDROcodone, guaiFENesin-dextromethorphan, nicotine polacrilex, ondansetron (ZOFRAN) IV, oxyCODONE  Micro Results Recent Results (from the past 240 hour(s))  Blood Culture (routine x 2)     Status: None   Collection Time: 05/25/19 10:22 AM   Specimen: BLOOD  Result Value Ref Range Status   Specimen Description   Final    BLOOD LEFT ANTECUBITAL Performed at [place] [address] [place], [place] [address]    Special Requests   Final    BOTTLES DRAWN AEROBIC AND ANAEROBIC Blood Culture adequate volume Performed at [place] [address] [place], [place] [address]    Culture   Final    NO GROWTH 5 DAYS Performed at [facility], [place] [address]  [place], [place] [address]    Report Status 05/30/2019 FINAL  Final  Blood Culture (routine x 2)     Status: None   Collection Time: 05/25/19 10:22 AM   Specimen: BLOOD LEFT FOREARM  Result Value Ref Range Status   Specimen Description   Final    BLOOD LEFT FOREARM Performed at [facility], [place] [address] [place], [place] [address]    Special Requests   Final    BOTTLES DRAWN AEROBIC AND ANAEROBIC Blood Culture adequate volume Performed at [place] [address], [place], [place] [address]    Culture   Final    NO GROWTH 5 DAYS Performed at [facility], [place] [address], [place], [place] [address]    Report Status 05/30/2019 FINAL  Final  SARS Coronavirus 2 [facility] order, Performed in [facility] hospital lab) Nasopharyngeal Nasopharyngeal Swab     Status: Abnormal   Collection Time: 05/25/19 10:58 AM   Specimen: Nasopharyngeal Swab  Result Value Ref Range Status   SARS Coronavirus 2 POSITIVE (A) NEGATIVE Final    Comment: RESULT CALLED TO, READ BACK BY AND VERIFIED WITH: TIM SMITH,RN [id number] @ [id number] BY J SCOTTON (NOTE) If result is NEGATIVE SARS-CoV-2 target nucleic acids are NOT DETECTED. The SARS-CoV-2 RNA is generally detectable in upper and lower  respiratory specimens during the acute phase of infection. The lowest  concentration of SARS-CoV-2 viral copies this assay can detect is 250  copies / mL. A negative result does not preclude SARS-CoV-2 infection  and should not be used as the sole basis for treatment or other  patient management decisions.  A negative result may occur with  improper specimen collection / handling, submission of specimen other  than nasopharyngeal swab, presence of viral mutation(s) within the  areas targeted by this assay, and inadequate number of viral copies  (<250 copies / mL). A negative result must be combined with clinical  observations, patient history, and epidemiological information. If result is  POSITIVE SARS-CoV-2 target nucleic acids are DETECTED . The SARS-CoV-2 RNA is generally detectable in upper and lower  respiratory specimens during the acute phase of infection.  Positive  results are indicative of active infection with SARS-CoV-2.  Clinical  correlation with patient history and other diagnostic information is  necessary to determine  patient infection status.  Positive results do  not rule out bacterial infection or co-infection with other viruses. If result is PRESUMPTIVE POSTIVE SARS-CoV-2 nucleic acids MAY BE PRESENT.   A presumptive positive result was obtained on the submitted specimen  and confirmed on repeat testing.  While 2019 novel coronavirus  (SARS-CoV-2) nucleic acids may be present in the submitted sample  additional confirmatory testing may be necessary for epidemiological  and / or clinical management purposes  to differentiate between  SARS-CoV-2 and other Sarbecovirus currently known to infect humans.  If clinically indicated additional testing with an alternate test  methodology [phone] ) is advised. The SARS-CoV-2 RNA is generally  detectable in upper and lower respiratory specimens during the acute  phase of infection. The expected result is Negative. Fact Sheet for Patients:  StrictlyIdeas.no Fact Sheet for Healthcare Providers: BankingDealers.co.za This test is not yet approved or cleared by the [place] FDA and has been authorized for detection and/or diagnosis of SARS-CoV-2 by FDA under an Emergency Use Authorization (EUA).  This EUA will remain in effect (meaning this test can be used) for the duration of the COVID-19 declaration under Section 564(b)(1) of the Act, 21 U.S.C. section 360bbb-3(b)(1), unless the authorization is terminated or revoked sooner. Performed at [facility], [place] [address] [place], [place] [address]   Urine culture     Status: None    Collection Time: 05/25/19 10:52 PM   Specimen: Urine, Random  Result Value Ref Range Status   Specimen Description   Final    URINE, RANDOM Performed at [place] [address] [place], [place] [address]    Special Requests   Final    NONE Performed at [facility], [place] [address] [place], [place] [address]    Culture   Final    NO GROWTH Performed at [facility], [place] [address], [place], [place] [address]    Report Status 05/26/2019 FINAL  Final  Expectorated sputum assessment w rflx to resp cult     Status: None   Collection Time: 05/26/19  1:07 AM   Specimen: Sputum  Result Value Ref Range Status   Specimen Description SPUTUM  Final   Special Requests NONE  Final   Sputum evaluation   Final    THIS SPECIMEN IS ACCEPTABLE FOR SPUTUM CULTURE Performed at [facility], [place] [address], [place], [place] [address]    Report Status 05/26/2019 FINAL  Final  Culture, respiratory     Status: None   Collection Time: 05/26/19  1:07 AM   Specimen: SPU  Result Value Ref Range Status   Specimen Description   Final    SPUTUM Performed at [place] [address] [place], [place] [address]    Special Requests   Final    NONE Reflexed from [id number] Performed at [facility], [place] [address] [place], [place] [address]    Gram Stain   Final    RARE WBC PRESENT, PREDOMINANTLY PMN FEW GRAM NEGATIVE RODS FEW GRAM POSITIVE COCCI IN CLUSTERS FEW GRAM NEGATIVE COCCOBACILLI    Culture   Final    MODERATE HAEMOPHILUS INFLUENZAE BETA LACTAMASE POSITIVE Performed at [facility], [address] [place], [place] [address]    Report Status 05/28/2019 FINAL  Final    Radiology Reports Ct Abdomen Pelvis Wo Contrast  Result Date: 05/25/2019 CLINICAL DATA:  73 year old male with history of smoking presenting with nausea  vomiting and diarrhea. Patient was found to be hypoxic  and COVID-19 positive. EXAM: CT ABDOMEN AND PELVIS WITHOUT CONTRAST TECHNIQUE: Multidetector CT imaging of the abdomen and pelvis was performed following the standard protocol without IV contrast. COMPARISON:  CT of the abdomen pelvis dated 08/21/2012. Chest radiograph dated 05/25/2019 FINDINGS: Evaluation of this exam is limited in the absence of intravenous contrast. Evaluation is also limited due to streak artifact caused by patient's arms as well as respiratory motion artifact. Lower chest: There is emphysematous changes of the lungs. Left lung base densities, new since the study of 2013 and concerning for superimposed infectious process. Clinical correlation is recommended. There is multi vessel coronary vascular calcification. No intra-abdominal free air or free fluid. Hepatobiliary: The liver is unremarkable. No intrahepatic biliary ductal dilatation. Cholecystectomy. Pancreas: Unremarkable. No pancreatic ductal dilatation or surrounding inflammatory changes. Spleen: Status post prior splenectomy with multiple residual splenic tissue. Adrenals/Urinary Tract: The adrenal glands are unremarkable. Postsurgical changes of prior left nephrectomy. Several right renal lesions which are not characterized on this unenhanced CT. The largest lesion in the upper pole of the right kidney measures up to 3.3 cm and demonstrates a fluid attenuation most consistent with a cyst. Several smaller high attenuating lesions are not characterized but may represent complex/hemorrhagic cysts. Ultrasound may provide better characterization. There is no hydronephrosis or nephrolithiasis. Renal vascular calcifications noted. The right ureter and urinary bladder appear unremarkable. Stomach/Bowel: There is no bowel obstruction or active inflammation. The appendix is not visualized with certainty. No inflammatory changes identified in the right lower quadrant. Vascular/Lymphatic: There is advanced atherosclerotic calcification of the aorta  and iliac arteries. The IVC is unremarkable. No portal venous gas. There is no adenopathy. Reproductive: Mildly enlarged prostate gland measuring approximately 4.3 cm in transverse axial diameter. Other: Midline vertical anterior pelvic wall incisional scar. There is a small fat containing ventral hernia the left of the midline. The neck of the hernia defect measures approximately 17 mm in transverse axial diameter. There is small amount of fluid and mild stranding of the herniated fat. Correlation with clinical exam is recommended to exclude strangulation or incarceration. No herniated bowel identified. Musculoskeletal: Advanced osteopenia. Multilevel degenerative changes of the spine with enthesopathy and findings consistent with ankylosing spondylitis. No acute fracture. IMPRESSION: 1. Increased left lung base densities concerning for developing infiltrate. Clinical correlation is recommended. 2. No acute intra-abdominal or pelvic pathology. No bowel obstruction or active inflammation. 3. Small fat containing ventral hernia with mild stranding of the herniated fat. Correlation with clinical exam is recommended to exclude strangulation or incarceration. No herniated bowel. 4. Aortic Atherosclerosis (ICD10-I70.0) and Emphysema (ICD10-J43.9). Electronically Signed   By: Anner Crete M.D.   On: 05/25/2019 21:43   Dg Chest Port 1 View  Result Date: 06/01/2019 CLINICAL DATA:  Hypoxia. EXAM: PORTABLE CHEST 1 VIEW COMPARISON:  Radiograph of May 30, 2019. FINDINGS: The heart size and mediastinal contours are within normal limits. No pneumothorax or pleural effusion is noted. Bilateral lung opacities noted on prior exam have nearly resolved. The visualized skeletal structures are unremarkable. IMPRESSION: Bilateral lung opacities noted on prior exam have nearly resolved. Aortic Atherosclerosis (ICD10-I70.0). Electronically Signed   By: Marijo Conception M.D.   On: 06/01/2019 08:41   Dg Chest Port 1  View  Result Date: 05/30/2019 CLINICAL DATA:  Hypoxia. EXAM: PORTABLE CHEST 1 VIEW COMPARISON:  05/26/2019. FINDINGS: The heart remains normal in size. Diffusely increased prominence of the interstitial markings in the right lung with no significant  change in diffuse prominence of the interstitial markings in the left lung. Small left pleural effusion with improvement. Diffuse osteopenia. Left upper quadrant abdominal surgical clips. IMPRESSION: Mildly progressive diffuse interstitial prominence on the right, stable on the left. Differential considerations include viral pneumonitis, drug reaction and pulmonary edema. Electronically Signed   By: Claudie Revering M.D.   On: 05/30/2019 08:18   Dg Chest Port 1 View  Result Date: 05/26/2019 CLINICAL DATA:  Hypoxia EXAM: PORTABLE CHEST 1 VIEW COMPARISON:  CT 04/12/2014, radiograph 05/25/2019. FINDINGS: Worsening interstitial opacities throughout both lungs, favored to reflect edema on a background of emphysematous change seen on prior CT. Apical pleuroparenchymal scarring is present. Abundant pericardial fat pad in the left lung base. The heart appears slightly enlarged from prior portable technique as do the central pulmonary arteries. Surgical clips are present in the left upper quadrant. Bones are diffusely demineralized. No acute osseous or soft tissue abnormality. IMPRESSION: Worsening interstitial opacities throughout both lungs, favored to reflect worsening edema on a background of emphysematous change seen on prior CT. Electronically Signed   By: Lovena Le M.D.   On: 05/26/2019 14:33   Dg Chest Port 1 View  Result Date: 05/25/2019 CLINICAL DATA:  Increased shortness of breath EXAM: PORTABLE CHEST 1 VIEW COMPARISON:  04/11/2019 FINDINGS: Cardiac shadow is stable. Aortic calcifications are noted. Increased interstitial markings are seen particularly on the left likely related to underlying edema. No sizable effusion is seen. No bony abnormality is noted.  IMPRESSION: Increasing interstitial changes likely related to underlying edema. Electronically Signed   By: Inez Catalina M.D.   On: 05/25/2019 10:46    Phillips Climes M.D on 06/01/2019 at 3:57 PM  Between 7am to 7pm - Pager - [phone]  After 7pm go to [email or web address] - password [facility]  [facility] -  Office  [phone]

## 2019-06-01 NOTE — Progress Notes (Signed)
Alex called. No answer voicemail left to call back the nurses station.

## 2019-06-01 NOTE — Plan of Care (Signed)
Progressing at all goals at this time.

## 2019-06-02 DIAGNOSIS — R0602 Shortness of breath: Secondary | ICD-10-CM

## 2019-06-02 DIAGNOSIS — R112 Nausea with vomiting, unspecified: Secondary | ICD-10-CM

## 2019-06-02 DIAGNOSIS — Z720 Tobacco use: Secondary | ICD-10-CM

## 2019-06-02 DIAGNOSIS — R531 Weakness: Secondary | ICD-10-CM

## 2019-06-02 DIAGNOSIS — R197 Diarrhea, unspecified: Secondary | ICD-10-CM

## 2019-06-02 DIAGNOSIS — J9601 Acute respiratory failure with hypoxia: Secondary | ICD-10-CM

## 2019-06-02 DIAGNOSIS — Z905 Acquired absence of kidney: Secondary | ICD-10-CM

## 2019-06-02 LAB — CBC
HCT: 49.3 % (ref 39.0–52.0)
Hemoglobin: 16.1 g/dL (ref 13.0–17.0)
MCH: 29.3 pg (ref 26.0–34.0)
MCHC: 32.7 g/dL (ref 30.0–36.0)
MCV: 89.6 fL (ref 80.0–100.0)
Platelets: 335 10*3/uL (ref 150–400)
RBC: 5.5 MIL/uL (ref 4.22–5.81)
RDW: 14.6 % (ref 11.5–15.5)
WBC: 16.8 10*3/uL — ABNORMAL HIGH (ref 4.0–10.5)
nRBC: 0 % (ref 0.0–0.2)

## 2019-06-02 LAB — COMPREHENSIVE METABOLIC PANEL
ALT: 24 U/L (ref 0–44)
AST: 18 U/L (ref 15–41)
Albumin: 3.1 g/dL — ABNORMAL LOW (ref 3.5–5.0)
Alkaline Phosphatase: 70 U/L (ref 38–126)
Anion gap: 13 (ref 5–15)
BUN: 51 mg/dL — ABNORMAL HIGH (ref 8–23)
CO2: 34 mmol/L — ABNORMAL HIGH (ref 22–32)
Calcium: 8.7 mg/dL — ABNORMAL LOW (ref 8.9–10.3)
Chloride: 99 mmol/L (ref 98–111)
Creatinine, Ser: 1 mg/dL (ref 0.61–1.24)
GFR calc Af Amer: >60 mL/min (ref >60)
GFR calc non Af Amer: >60 mL/min (ref >60)
Glucose, Bld: 112 mg/dL — ABNORMAL HIGH (ref 70–99)
Potassium: 4 mmol/L (ref 3.5–5.1)
Sodium: 146 mmol/L — ABNORMAL HIGH (ref 135–145)
Total Bilirubin: 1 mg/dL (ref 0.3–1.2)
Total Protein: 6.7 g/dL (ref 6.5–8.1)

## 2019-06-02 LAB — C-REACTIVE PROTEIN: CRP: 1.4 mg/dL — ABNORMAL HIGH (ref <1.0)

## 2019-06-02 NOTE — TOC Initial Note (Signed)
Transition of Care [facility]) - Initial/Assessment Note    Patient Details  Name: Kristopher [name] MRN: [id number] Date of Birth: 09-02-1946  Transition of Care [facility]) CM/SW Contact:    Ninfa Meeker, RN Phone Number: [phone] (working remotely) 06/02/2019, 11:14 AM  Clinical Narrative:   73 yr old gentleman admitted and being treated for COVID 19. Thankfully patient is improving and will discharge home soon. Case manager spoke with patient via telephone to discuss [place] and needs at discharge. Choice for Home Health agency offered, referral called to Candi Leash, [facility]. Patient says he lives alone but his neighbor Vaughan Basta will be assisting him and will be available to receive oxygen. Case Manager called [phone], to confirm. She definitely will be at the patient's home for oxygen delivery. Vaughan Basta states that she is not able to pick patient up at discharge, she was exposed to patient prior to admission and had her COVID test on Tuesday.Patient will need PTAR transport home. Vaughan Basta said that she has cleaned and disinfected his home and will send clothing to the hospital for discharge. Case manager contacted Learta Codding with [employer] to arrange for home oxygen.            Expected Discharge Plan: [place] Barriers to Discharge: No Barriers Identified   Patient Goals and CMS Choice Patient states their goals for this hospitalization and ongoing recovery are:: get better CMS Medicare.gov Compare Post Acute Care list provided to:: Patient Choice offered to / list presented to : Patient  Expected Discharge Plan and Services Expected Discharge Plan: [place] In-house Referral: NA Discharge Planning Services: CM Consult Post Acute Care Choice: Durable Medical Equipment, Home Health                   DME Arranged: Oxygen   Date DME Agency Contacted: 06/02/19 Time DME Agency Contacted: 1112 Representative spoke with at  DME Agency: Learta Codding HH Arranged: PT, OT, Nurse's Aide [place] Agency: [place] Date [place]: 06/02/19 Time [place]: [id number] Representative spoke with at [place]: Candi Leash  Prior Living Arrangements/Services   Lives with:: Self Patient language and need for interpreter reviewed:: No Do you feel safe going back to the place where you live?: Yes      Need for Family Participation in Patient Care: Yes (Comment) Care giver support system in place?: Yes (comment)   Criminal Activity/Legal Involvement Pertinent to Current Situation/Hospitalization: No - Comment as needed  Activities of Daily Living      Permission Sought/Granted Permission sought to share information with : Case Manager, Family Supports Permission granted to share information with : Yes, Verbal Permission Granted  Share Information with NAME: Vaughan Basta - neighbor/friend           Emotional Assessment   Attitude/Demeanor/Rapport: Gracious   Orientation: : Oriented to Self, Oriented to Place, Oriented to  Time, Oriented to Situation Alcohol / Substance Use: Not Applicable    Admission diagnosis:  Shortness of breath [R06.02] Hyperkalemia [E87.5] Weakness [R53.1] AKI (acute kidney injury) ([place]) [N17.9] Nausea vomiting and diarrhea [R11.2, R19.7] COVID-19 virus infection [U07.1] Patient Active Problem List   Diagnosis Date Noted  . COVID-19 virus infection 05/25/2019  . AKI (acute kidney injury) ([place])   . PNA (pneumonia) 08/20/2012  . Nausea & vomiting 08/20/2012  . Grief at loss of child 08/20/2012  . Healthcare maintenance 11/29/2011  . Anorexia 04/17/2011  . TOBACCO ABUSE 11/23/2009  .  Dyslipidemia 05/01/2009  . Essential hypertension 05/01/2009  . CARDIOMYOPATHY 05/01/2009   PCP:  Alvester Chou, NP Pharmacy:   [facility] [address] [place] [address] Phone: [phone] Fax: [place],  [place] [address] Phone: [phone] Fax: [place], [place] [address] [place] [address] Phone: [phone] Fax: [phone]     Social Determinants of Health (SDOH) Interventions    Readmission Risk Interventions No flowsheet data found.

## 2019-06-02 NOTE — Progress Notes (Signed)
SATURATION QUALIFICATIONS: (This note is used to comply with regulatory documentation for home oxygen)  Patient Saturations on Room Air at Rest = 94%  Patient Saturations on Room Air while Ambulating = 85%  Patient Saturations on 2 Liters of oxygen while Ambulating = 91%  Please briefly explain why patient needs home oxygen:  

## 2019-06-02 NOTE — Progress Notes (Signed)
Spoke with Cristie Hem. All questions about discharge planning answered.

## 2019-06-02 NOTE — Progress Notes (Signed)
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  As well found to be in renal failure, with a creatinine of 4, and he is with significant dysphagia as well, he was transferred to [facility] for further management   Subjective: 8/20 A/O x4, positive S OB, negative N/V, negative abdominal pain.  Patient extremely weak however states will not go to SNF.   Assessment & Plan:   Active Problems:   COVID-19 virus infection   AKI (acute kidney injury) ([place])   Active Problems:   COVID-19 virus infection   AKI (acute kidney injury) ([place])   Acute respiratory failure with hypoxia/COVID pneumonia/positive H influenza pneumonia - On presentation patient hypoxic SPO2 88%, on 6 L via nasal cannula.  Titrated down to 4 L via nasal cannula - Incentive spirometry and flutter valve - 8/13 patient met criteria of new onset O2 requirement, CXR with bilateral opacifications started on Remdesivir -Sputum culture positive H influenza; complete 5-day course of Rocephin -Does not meet criteria for Actemra - Continue COVID vitamin protocol -NOTE; Hx splenectomy - Ambulatory SPO2 pending - OT recommends SNF however patient refuses - 8/20 consult to social work; can patient receive PT/OT, home health being COVID positive? - On exertion SPO2 decreases to 84% on room air - Solu-Medrol 40 mg daily - Atrovent TID -Xopenex TID Recent Labs  Lab 05/29/19 0310 05/30/19 0445 05/31/19 0312 06/01/19 0330 06/02/19 0305  CRP 4.6* 7.6* 5.1* 2.7* 1.4*   Recent Labs  Lab 05/27/19 0326 05/28/19 0140 05/29/19 0310 05/30/19 0445  DDIMER 2.88* 1.78* 1.20* 0.90*    Acute kidney injury (patient with single kidney; s/p LEFT  nephrectomy traumatic) - Baseline Cr 1.14 (taken on 03/17/2016) - Hold all nephrotoxic medication - Monitor closely Recent Labs  Lab 05/29/19 0310 05/30/19 0445 05/31/19 0312 06/01/19 0330 06/02/19 0305  CREATININE 1.09 1.13 1.12 1.22 1.00  - Creatinine significant improvement better than previous creatinine reading 2017 -Patient on diuretic at home (Lasix 40 mg daily).  Currently on hold - Strict in and out -Daily weight Filed Weights   05/25/19 1059 05/26/19 0500 05/27/19 0600  Weight: 63.5 kg 61.5 kg 61.3 kg   Hyperkalemia - In setting of renal failure resolved  Essential HTN/hypotension - BP on lower side on admission antihypertensive medication held - BP began to increase - Norvasc 10 mg daily - Coreg 3.125 mg BID  Bradycardia - Resolved - EKG with LBBB appears to be old, negative CP  Dysphasia -Seen by SLP, advanced to regular diet  Moderate/severe protein calorie malnutrition - Continue with supplement.,  Nutritionist consulted  Elevated lipase -Per EMR was 127--> 43 - Lipase pending 8/21  Hx splenectomy - High risk bacterial infection - Has he received pneumococcal vaccination?  Anxiety/depression -Continue home meds  HLD - Simvastatin 20 mg daily - Monitor LFTs  Hypophosphatemia -Started on Neutra-Phos -Phosphorus pending 8/21   Tobacco abuse - Tobacco cessation counseling at bedside -Nicotine patch -Since patient will most likely need O2 at home will counsel patient in a.m. concerning risk of DEATH with smoking and O2 use    DVT prophylaxis: Lovenox Code Status: Full Family Communication: None Disposition Plan: TBD   Consultants:    Procedures/Significant Events:     I have  personally reviewed and interpreted all radiology studies and my findings are as above.  VENTILATOR SETTINGS:    Cultures   Antimicrobials: Anti-infectives (From admission, onward)   Start     Stop   05/30/19 1000  cefTRIAXone (ROCEPHIN) 1 g in  sodium chloride 0.9 % 100 mL IVPB     06/04/19 0959   05/26/19 1700  remdesivir 100 mg in sodium chloride 0.9 % 250 mL IVPB     05/29/19 1649   05/25/19 1700  remdesivir 200 mg in sodium chloride 0.9 % 250 mL IVPB     05/25/19 1725       Devices    LINES / TUBES:      Continuous Infusions:  cefTRIAXone (ROCEPHIN)  IV 1 g (06/02/19 0843)     Objective: Vitals:   06/01/19 1718 06/01/19 2015 06/02/19 0500 06/02/19 0833  BP:  127/80    Pulse:  76  63  Resp:  (!) 27  (!) 26  Temp:  97.8 F (36.6 C) (!) 97.3 F (36.3 C)   TempSrc:  Oral    SpO2: (!) 87% 95%  91%  Weight:      Height:        Intake/Output Summary (Last 24 hours) at 06/02/2019 0914 Last data filed at 06/02/2019 0000 Gross per 24 hour  Intake 1440 ml  Output 1200 ml  Net 240 ml   Filed Weights   05/25/19 1059 05/26/19 0500 05/27/19 0600  Weight: 63.5 kg 61.5 kg 61.3 kg    Examination:  General: A/O x4, positive acute respiratory distress, cachectic Eyes: negative scleral hemorrhage, negative anisocoria, negative icterus ENT: Negative Runny nose, negative gingival bleeding, Neck:  Negative scars, masses, torticollis, lymphadenopathy, JVD Lungs: Tachypneic, clear to auscultation bilaterally without wheezes or crackles Cardiovascular: Regular rate and rhythm without murmur gallop or rub normal S1 and S2 Abdomen: negative abdominal pain, nondistended, positive soft, bowel sounds, no rebound, no ascites, no appreciable mass Extremities: No significant cyanosis, clubbing, or edema bilateral lower extremities Skin: Negative rashes, lesions, ulcers Psychiatric:  Negative depression, negative anxiety, negative fatigue, negative mania- patient DOES NOT appreciate seriousness of illness Central nervous system:  Cranial nerves II through XII intact, tongue/uvula midline, all extremities muscle strength 5/5, sensation intact throughout,  negative dysarthria, negative expressive aphasia, negative receptive  aphasia.  .     Data Reviewed: Care during the described time interval was provided by me .  I have reviewed this patient's available data, including medical history, events of note, physical examination, and all test results as part of my evaluation.   CBC: Recent Labs  Lab 05/27/19 0326 05/28/19 0140 05/29/19 0310 05/30/19 0445 05/31/19 0312 06/01/19 0330 06/02/19 0305  WBC 20.0* 16.8* 18.2* 11.5* 9.4 12.1* 16.8*  NEUTROABS 18.5* 15.4* 15.4* 10.2*  --   --   --   HGB 14.4 14.1 15.4 14.2 16.2 16.2 16.1  HCT 44.8 43.6 47.9 42.9 48.1 47.7 49.3  MCV 92.9 92.2 91.2 88.8 87.5 87.8 89.6  PLT 358 344 351 311 331 333 965   Basic Metabolic Panel: Recent Labs  Lab 05/27/19 0326 05/28/19 0140 05/29/19 0310 05/30/19 0445 05/30/19 2110 05/31/19 0312 05/31/19 2310 06/01/19 0330 06/02/19 0305  NA 145 143 145 145  --  141  --  142 146*  K 4.2 4.2 4.3 4.4  --  4.3  --  4.0 4.0  CL 112* 112* 112* 109  --  96*  --  97* 99  CO2 _0 --  33*  --  33* 34*  GLUCOSE 121* 157* 121* 130*  --  152*  --  153* 112*  BUN 45* 51* 51* 50*  --  55*  --  56* 51*  CREATININE 1.68* 1.37* 1.09 1.13  --  1.12  --  1.22 1.00  CALCIUM 8.5* 8.3* 8.5* 8.3*  --  8.7*  --  8.7* 8.7*  MG 2.3 2.2 2.0 1.9 1.9  --  1.9  --   --   PHOS 4.6 2.6 1.9* 2.9  --   --   --   --   --    GFR: Estimated Creatinine Clearance: 57 mL/min (by C-G formula based on SCr of 1 mg/dL). Liver Function Tests: Recent Labs  Lab 05/29/19 0310 05/30/19 0445 05/31/19 0312 06/01/19 0330 06/02/19 0305  AST _0 ALT _1 ALKPHOS 66 66 69 65 70  BILITOT 0.5 0.8 0.9 0.8 1.0  PROT 6.4* 5.9* 6.9 6.5 6.7  ALBUMIN 2.9* 2.7* 3.0* 2.9* 3.1*   No results for input(s): LIPASE, AMYLASE in the last 168 hours. No results for input(s): AMMONIA in the last 168 hours. Coagulation Profile: No results for input(s): INR, PROTIME in the last 168 hours. Cardiac Enzymes: No results for input(s): CKTOTAL, CKMB,  CKMBINDEX, TROPONINI in the last 168 hours. BNP (last 3 results) No results for input(s): PROBNP in the last 8760 hours. HbA1C: No results for input(s): HGBA1C in the last 72 hours. CBG: Recent Labs  Lab 05/26/19 0933 05/26/19 1700  GLUCAP 103* 186*   Lipid Profile: No results for input(s): CHOL, HDL, LDLCALC, TRIG, CHOLHDL, LDLDIRECT in the last 72 hours. Thyroid Function Tests: No results for input(s): TSH, T4TOTAL, FREET4, T3FREE, THYROIDAB in the last 72 hours. Anemia Panel: No results for input(s): VITAMINB12, FOLATE, FERRITIN, TIBC, IRON, RETICCTPCT in the last 72 hours. Urine analysis:    Component Value Date/Time   COLORURINE YELLOW 05/25/2019 2252   APPEARANCEUR HAZY (A) 05/25/2019 2252   LABSPEC 1.013 05/25/2019 2252   PHURINE 5.0 05/25/2019 2252   GLUCOSEU NEGATIVE 05/25/2019 2252   HGBUR NEGATIVE 05/25/2019 2252   BILIRUBINUR NEGATIVE 05/25/2019 2252   KETONESUR NEGATIVE 05/25/2019 2252   PROTEINUR NEGATIVE 05/25/2019 2252   UROBILINOGEN 0.2 10/18/2012 0733   NITRITE NEGATIVE 05/25/2019 2252   LEUKOCYTESUR NEGATIVE 05/25/2019 2252   Sepsis Labs: _2 (procalcitonin:4,lacticidven:4)  ) Recent Results (from the past 240 hour(s))  Blood Culture (routine x 2)     Status: None   Collection Time: 05/25/19 10:22 AM   Specimen: BLOOD  Result Value Ref Range Status   Specimen Description   Final    BLOOD LEFT ANTECUBITAL Performed at [facility], [place] [address] [place], [place] [address]    Special Requests   Final    BOTTLES DRAWN AEROBIC AND ANAEROBIC Blood Culture adequate volume Performed at [place] [address], [place], [place] [address]    Culture   Final    NO GROWTH 5 DAYS Performed at [facility], [place] [address] [place], [place] [address]    Report Status 05/30/2019 FINAL  Final  Blood Culture (routine x 2)     Status: None   Collection Time: 05/25/19 10:22 AM   Specimen: BLOOD LEFT  FOREARM  Result Value Ref Range Status   Specimen Description   Final    BLOOD LEFT FOREARM Performed at [facility], [place] [address] [place], [place] [address]  Special Requests   Final    BOTTLES DRAWN AEROBIC AND ANAEROBIC Blood Culture adequate volume Performed at [place] [address], [place], [place] [address]    Culture   Final    NO GROWTH 5 DAYS Performed at [facility], [place] [address] [place], [place] [address]    Report Status 05/30/2019 FINAL  Final  SARS Coronavirus 2 [facility] order, Performed in [facility] hospital lab) Nasopharyngeal Nasopharyngeal Swab     Status: Abnormal   Collection Time: 05/25/19 10:58 AM   Specimen: Nasopharyngeal Swab  Result Value Ref Range Status   SARS Coronavirus 2 POSITIVE (A) NEGATIVE Final    Comment: RESULT CALLED TO, READ BACK BY AND VERIFIED WITH: TIM SMITH,RN [id number] @ 1478 BY J SCOTTON (NOTE) If result is NEGATIVE SARS-CoV-2 target nucleic acids are NOT DETECTED. The SARS-CoV-2 RNA is generally detectable in upper and lower  respiratory specimens during the acute phase of infection. The lowest  concentration of SARS-CoV-2 viral copies this assay can detect is 250  copies / mL. A negative result does not preclude SARS-CoV-2 infection  and should not be used as the sole basis for treatment or other  patient management decisions.  A negative result may occur with  improper specimen collection / handling, submission of specimen other  than nasopharyngeal swab, presence of viral mutation(s) within the  areas targeted by this assay, and inadequate number of viral copies  (<250 copies / mL). A negative result must be combined with clinical  observations, patient history, and epidemiological information. If result is POSITIVE SARS-CoV-2 target nucleic acids are DETECTED . The SARS-CoV-2 RNA is generally detectable in upper and lower  respiratory specimens during the acute phase of  infection.  Positive  results are indicative of active infection with SARS-CoV-2.  Clinical  correlation with patient history and other diagnostic information is  necessary to determine patient infection status.  Positive results do  not rule out bacterial infection or co-infection with other viruses. If result is PRESUMPTIVE POSTIVE SARS-CoV-2 nucleic acids MAY BE PRESENT.   A presumptive positive result was obtained on the submitted specimen  and confirmed on repeat testing.  While 2019 novel coronavirus  (SARS-CoV-2) nucleic acids may be present in the submitted sample  additional confirmatory testing may be necessary for epidemiological  and / or clinical management purposes  to differentiate between  SARS-CoV-2 and other Sarbecovirus currently known to infect humans.  If clinically indicated additional testing with an alternate test  methodology [phone] ) is advised. The SARS-CoV-2 RNA is generally  detectable in upper and lower respiratory specimens during the acute  phase of infection. The expected result is Negative. Fact Sheet for Patients:  StrictlyIdeas.no Fact Sheet for Healthcare Providers: BankingDealers.co.za This test is not yet approved or cleared by the [place] FDA and has been authorized for detection and/or diagnosis of SARS-CoV-2 by FDA under an Emergency Use Authorization (EUA).  This EUA will remain in effect (meaning this test can be used) for the duration of the COVID-19 declaration under Section 564(b)(1) of the Act, 21 U.S.C. section 360bbb-3(b)(1), unless the authorization is terminated or revoked sooner. Performed at [facility], [place] [address], [place],  [address]   Urine culture     Status: None   Collection Time: 05/25/19 10:52 PM   Specimen: Urine, Random  Result Value Ref Range Status   Specimen Description   Final    URINE, RANDOM Performed at [facility], [place] [address], [place], [place] [address]    Special Requests   Final    NONE Performed at [facility], [place] [address], [place], [place] [address]    Culture   Final    NO GROWTH Performed at [facility], [place] [address], [place], [place] [address]    Report Status 05/26/2019 FINAL  Final  Expectorated sputum assessment w rflx to resp cult     Status: None   Collection Time: 05/26/19  1:07 AM   Specimen: Sputum  Result Value Ref Range Status   Specimen Description SPUTUM  Final   Special Requests NONE  Final   Sputum evaluation   Final    THIS SPECIMEN IS ACCEPTABLE FOR SPUTUM CULTURE Performed at [facility], [place] [address], [place], [place] [address]    Report Status 05/26/2019 FINAL  Final  Culture, respiratory     Status: None   Collection Time: 05/26/19  1:07 AM   Specimen: SPU  Result Value Ref Range Status   Specimen Description   Final    SPUTUM Performed at [place] [address] [place], [place] [address]    Special Requests   Final    NONE Reflexed from [phone] Performed at [facility], [place] [address], [place] [employer] [place], [place] [address]    Gram Stain   Final    RARE WBC PRESENT, PREDOMINANTLY PMN FEW GRAM NEGATIVE RODS FEW GRAM POSITIVE COCCI IN CLUSTERS FEW GRAM NEGATIVE COCCOBACILLI    Culture   Final    MODERATE HAEMOPHILUS INFLUENZAE BETA LACTAMASE POSITIVE Performed at [facility], [address] [place], [place] [address]    Report Status 05/28/2019 FINAL  Final         Radiology Studies: Dg Chest Port 1 View  Result Date: 06/01/2019 CLINICAL DATA:  Hypoxia. EXAM: PORTABLE CHEST 1 VIEW COMPARISON:  Radiograph of May 30, 2019. FINDINGS: The heart size and mediastinal contours are within normal limits. No pneumothorax or pleural effusion is noted. Bilateral lung opacities noted on prior exam have nearly resolved. The visualized skeletal  structures are unremarkable. IMPRESSION: Bilateral lung opacities noted on prior exam have nearly resolved. Aortic Atherosclerosis (ICD10-I70.0). Electronically Signed   By: Marijo Conception M.D.   On: 06/01/2019 08:41        Scheduled Meds:  amLODipine  10 mg Oral Daily   carvedilol  3.125 mg Oral BID WC   Chlorhexidine Gluconate Cloth  6 each Topical Daily   cholecalciferol  1,000 Units Oral Daily   enoxaparin (LOVENOX) injection  40 mg Subcutaneous Q24H   feeding supplement (ENSURE ENLIVE)  237 mL Oral BID BM   FLUoxetine  20 mg Oral QHS   guaiFENesin  1,200 mg Oral BID   ipratropium  2 puff Inhalation Q8H   levalbuterol  2 puff Inhalation Q8H   loratadine  10 mg Oral Daily   methylPREDNISolone (SOLU-MEDROL) injection  40 mg Intravenous Daily   nicotine  7 mg Transdermal QHS   pantoprazole  40 mg Oral Daily   phosphorus  500 mg Oral Daily   polyethylene glycol  17 g Oral QHS   simvastatin  20 mg Oral QHS   tamsulosin  0.4 mg Oral QPC supper   vitamin C  500 mg Oral Daily   zinc sulfate  220 mg Oral Daily   Continuous Infusions:  cefTRIAXone (ROCEPHIN)  IV 1 g (06/02/19 0843)     LOS:  8 days   The patient is critically ill with multiple organ systems failure and requires high complexity decision making for assessment and support, frequent evaluation and titration of therapies, application of advanced monitoring technologies and extensive interpretation of multiple databases. Critical Care Time devoted to patient care services described in this note  Time spent: 40 minutes     Allyanna Appleman, Geraldo Docker, MD [facility] Pager [phone]  If 7PM-7AM, please contact night-coverage [email or web address] Password [email or web address] 06/02/2019, 9:14 AM

## 2019-06-02 NOTE — Evaluation (Signed)
Occupational Therapy Evaluation Patient Details Name: Kristopher [name] MRN: [id number] DOB: 04-Sep-1946 Today's Date: 06/02/2019    History of Present Illness 73 y.o. male admitted on 05/25/19 for SOB, weakness, emesis.  Pt dx with COVID 19 PNA, AKI, hyperkalemia, bradycardia, hypotension.  Pt with significant PMH of HOH (R ear better than L), HTN, depression, cardiomyopathy.     Clinical Impression   PTA Pt independent in ADL and mobility. Works sometimes at [employer]. Today Pt was on RA throughout session, initially 90-93 on RA, desaturates with activity to 84%, returns to >90 with pursed lip breathing and seated rest break. Pt much steadier today with SPC in room for mobility (with gait gelt and min guard for safety) but no LOB today. Pt adamant about returning home and getting therapy at home. Pt has a very small bathroom - he really needs to sit to bathe for safety and energy conservation but I do not think that one will fit in his bathroom. Pt is low health literacy and will need LOTS of continued education for safety to prevent re-admission/falls.  OT will continue to follow acutely.     Follow Up Recommendations  Home health OT;Supervision - Intermittent(Pt is currently refusing SNF)    Equipment Recommendations  Tub/shower seat    Recommendations for Other Services PT consult     Precautions / Restrictions Precautions Precautions: Fall;Other (comment) Precaution Comments: LOW HEALTH IQ Restrictions Weight Bearing Restrictions: No      Mobility Bed Mobility Overal bed mobility: Modified Independent             General bed mobility comments: slow, extra time  Transfers Overall transfer level: Needs assistance Equipment used: Straight cane Transfers: Sit to/from Stand Sit to Stand: Min assist         General transfer comment: Min assist to come to standing for balance and slight boost with repetition    Balance Overall balance assessment: Needs  assistance Sitting-balance support: Feet supported;No upper extremity supported Sitting balance-Leahy Scale: Good     Standing balance support: Single extremity supported Standing balance-Leahy Scale: Fair Standing balance comment: needs external assist in standing.                            ADL either performed or assessed with clinical judgement   ADL Overall ADL's : Needs assistance/impaired Eating/Feeding: Independent   Grooming: Wash/dry hands;Wash/dry face;Oral care;Min guard;Standing Grooming Details (indicate cue type and reason): standing at sink, O2 dropped to 84% on RA Upper Body Bathing: Set up;Sitting   Lower Body Bathing: Set up;Sitting/lateral leans   Upper Body Dressing : Set up;Sitting   Lower Body Dressing: Min guard;Sit to/from stand Lower Body Dressing Details (indicate cue type and reason): able to perform figure 4 to don socks, min guard for sit<>stand with clothing management Toilet Transfer: Min guard;Ambulation(SPC)   Toileting- Water quality scientist and Hygiene: Min guard;Sit to/from stand       Functional mobility during ADLs: Min guard General ADL Comments: desaturates with activity to mid 80's on RA     Vision Patient Visual Report: No change from baseline       Perception     Praxis      Pertinent Vitals/Pain Pain Assessment: No/denies pain Pain Intervention(s): Monitored during session;Repositioned     Hand Dominance Right   Extremity/Trunk Assessment Upper Extremity Assessment Upper Extremity Assessment: Overall WFL for tasks assessed   Lower Extremity Assessment Lower Extremity Assessment: Generalized weakness  Cervical / Trunk Assessment Cervical / Trunk Assessment: Other exceptions Cervical / Trunk Exceptions: forward head   Communication Communication Communication: HOH(R ear is better than L ear)   Cognition Arousal/Alertness: Awake/alert Behavior During Therapy: WFL for tasks  assessed/performed Overall Cognitive Status: Impaired/Different from baseline Area of Impairment: Safety/judgement;Awareness                         Safety/Judgement: Decreased awareness of safety;Decreased awareness of deficits Awareness: Emergent   General Comments: Conversation normal, LOW HEALTH IQ   General Comments  "I wouldn't be this bad if I'd have been able to move before yesterday"    Exercises     Shoulder Instructions      Home Living Family/patient expects to be discharged to:: Private residence Living Arrangements: Alone Available Help at Discharge: Friend(s);Available PRN/intermittently Type of Home: Mobile home Home Access: Stairs to enter Entrance Stairs-Number of Steps: 2 Entrance Stairs-Rails: None Home Layout: One level     Bathroom Shower/Tub: Tub/shower unit;Walk-in shower   Bathroom Toilet: Standard Bathroom Accessibility: No   Home Equipment: Cane - single point   Additional Comments: shower will not fit shower chair      Prior Functioning/Environment Level of Independence: Independent        Comments: Pt reports he still works on [employer] I want to" and likes to go fishing        OT Problem List: Decreased activity tolerance;Impaired balance (sitting and/or standing);Decreased safety awareness;Decreased cognition;Decreased knowledge of use of DME or AE;Decreased knowledge of precautions;Cardiopulmonary status limiting activity      OT Treatment/Interventions: Self-care/ADL training;Therapeutic exercise;Energy conservation;DME and/or AE instruction;Therapeutic activities;Patient/family education;Balance training    OT Goals(Current goals can be found in the care plan section) Acute Rehab OT Goals Patient Stated Goal: to go home and sit on his porch OT Goal Formulation: With patient Time For Goal Achievement: 06/16/19 Potential to Achieve Goals: Good ADL Goals Pt Will Perform Grooming: with modified  independence;standing Pt Will Perform Upper Body Bathing: with modified independence;standing Pt Will Perform Lower Body Bathing: with modified independence;sit to/from stand Pt Will Transfer to Toilet: with modified independence;ambulating Pt Will Perform Toileting - Clothing Manipulation and hygiene: with modified independence;sit to/from stand Additional ADL Goal #1: Pt will recall 3 ways of conserving energy during ADL at independent level  OT Frequency: Min 3X/week   Barriers to D/C: Decreased caregiver support  Pt lives alone but is currently refusing SNF       Co-evaluation              AM-PAC OT "6 Clicks" Daily Activity     Outcome Measure Help from another person eating meals?: None Help from another person taking care of personal grooming?: A Little Help from another person toileting, which includes using toliet, bedpan, or urinal?: A Little Help from another person bathing (including washing, rinsing, drying)?: A Little Help from another person to put on and taking off regular upper body clothing?: None Help from another person to put on and taking off regular lower body clothing?: A Little 6 Click Score: 20   End of Session Equipment Utilized During Treatment: Gait belt;Other (comment)(straight cane) Nurse Communication: Mobility status;Other (comment)(SpO2)  Activity Tolerance: Patient tolerated treatment well Patient left: in bed;with call bell/phone within reach;Other (comment)(MD in room)  OT Visit Diagnosis: Unsteadiness on feet (R26.81);Other abnormalities of gait and mobility (R26.89);Muscle weakness (generalized) (M62.81);Other symptoms and signs involving cognitive function  Time: [id number] OT Time Calculation (min): 32 min Charges:  OT General Charges $OT Visit: 1 Visit OT Evaluation $OT Eval Moderate Complexity: 1 Mod OT Treatments $Self Care/Home Management : 8-22 mins  Hulda Humphrey OTR/L Acute Rehabilitation Services Pager:  [phone] Office: [place] 06/02/2019, 2:46 PM

## 2019-06-03 DIAGNOSIS — J1289 Other viral pneumonia: Secondary | ICD-10-CM

## 2019-06-03 LAB — COMPREHENSIVE METABOLIC PANEL
ALT: 30 U/L (ref 0–44)
AST: 19 U/L (ref 15–41)
Albumin: 2.9 g/dL — ABNORMAL LOW (ref 3.5–5.0)
Alkaline Phosphatase: 73 U/L (ref 38–126)
Anion gap: 11 (ref 5–15)
BUN: 40 mg/dL — ABNORMAL HIGH (ref 8–23)
CO2: 29 mmol/L (ref 22–32)
Calcium: 8.7 mg/dL — ABNORMAL LOW (ref 8.9–10.3)
Chloride: 105 mmol/L (ref 98–111)
Creatinine, Ser: 0.94 mg/dL (ref 0.61–1.24)
GFR calc Af Amer: >60 mL/min (ref >60)
GFR calc non Af Amer: >60 mL/min (ref >60)
Glucose, Bld: 94 mg/dL (ref 70–99)
Potassium: 4.3 mmol/L (ref 3.5–5.1)
Sodium: 145 mmol/L (ref 135–145)
Total Bilirubin: 0.7 mg/dL (ref 0.3–1.2)
Total Protein: 6.3 g/dL — ABNORMAL LOW (ref 6.5–8.1)

## 2019-06-03 LAB — CBC
HCT: 48.6 % (ref 39.0–52.0)
Hemoglobin: 16.2 g/dL (ref 13.0–17.0)
MCH: 29.7 pg (ref 26.0–34.0)
MCHC: 33.3 g/dL (ref 30.0–36.0)
MCV: 89.2 fL (ref 80.0–100.0)
Platelets: 304 10*3/uL (ref 150–400)
RBC: 5.45 MIL/uL (ref 4.22–5.81)
RDW: 14.7 % (ref 11.5–15.5)
WBC: 18.3 10*3/uL — ABNORMAL HIGH (ref 4.0–10.5)
nRBC: 0 % (ref 0.0–0.2)

## 2019-06-03 LAB — PHOSPHORUS: Phosphorus: 3.1 mg/dL (ref 2.5–4.6)

## 2019-06-03 LAB — LIPASE, BLOOD: Lipase: 43 U/L (ref 11–51)

## 2019-06-03 LAB — C-REACTIVE PROTEIN: CRP: 1 mg/dL — ABNORMAL HIGH (ref <1.0)

## 2019-06-03 MED ORDER — LEVALBUTEROL TARTRATE 45 MCG/ACT IN AERO: 2.0000 | INHALATION_SPRAY | Freq: Three times a day (TID) | RESPIRATORY_TRACT | 0 refills | Status: DC

## 2019-06-03 MED ORDER — TAMSULOSIN HCL 0.4 MG PO CAPS: 0.4000 mg | ORAL_CAPSULE | Freq: Every day | ORAL | 0 refills | Status: DC

## 2019-06-03 MED ORDER — ASCORBIC ACID 500 MG PO TABS: 500.0000 mg | ORAL_TABLET | Freq: Every day | ORAL | 0 refills | Status: DC

## 2019-06-03 MED ORDER — IPRATROPIUM BROMIDE HFA 17 MCG/ACT IN AERS: 2.0000 | INHALATION_SPRAY | Freq: Three times a day (TID) | RESPIRATORY_TRACT | 0 refills | Status: DC

## 2019-06-03 MED ORDER — ZINC SULFATE 220 (50 ZN) MG PO CAPS: 220.0000 mg | ORAL_CAPSULE | Freq: Every day | ORAL | 0 refills | Status: DC

## 2019-06-03 MED ORDER — CARVEDILOL 3.125 MG PO TABS: 3.1250 mg | ORAL_TABLET | Freq: Two times a day (BID) | ORAL | 0 refills | Status: DC

## 2019-06-03 MED ORDER — NICOTINE 7 MG/24HR TD PT24: 7.0000 mg | MEDICATED_PATCH | Freq: Every day | TRANSDERMAL | 0 refills | Status: DC

## 2019-06-03 MED ORDER — GUAIFENESIN ER 600 MG PO TB12: 1200.0000 mg | ORAL_TABLET | Freq: Two times a day (BID) | ORAL | 0 refills | Status: DC

## 2019-06-03 MED ORDER — VITAMIN D3 25 MCG PO TABS: 1000.0000 [IU] | ORAL_TABLET | Freq: Every day | ORAL | 0 refills | Status: DC

## 2019-06-03 MED ORDER — AMLODIPINE BESYLATE 10 MG PO TABS: 10.0000 mg | ORAL_TABLET | Freq: Every day | ORAL | 0 refills | Status: DC

## 2019-06-03 MED ORDER — NICOTINE POLACRILEX 2 MG MT GUM: 2.0000 mg | CHEWING_GUM | OROMUCOSAL | 0 refills | Status: DC | PRN

## 2019-06-03 NOTE — Care Management (Signed)
Case manager has contacted [facility] to arrange transport home for patient. Bedside RN notified. Medical necessity form and facesheet printed to the unit.      Ricki Miller, RN BSN Case Manager [phone]

## 2019-06-03 NOTE — Progress Notes (Signed)
SATURATION QUALIFICATIONS: (This note is used to comply with regulatory documentation for home oxygen)  Patient Saturations on Room Air at Rest = 92%  Patient Saturations on Room Air while Ambulating = 82%  Patient Saturations on 2 Liters of oxygen while Ambulating = 90%  Please briefly explain why patient needs home oxygen: Pt desaturates on RA while moving.  Barbarann Ehlers Kenza Munar, PT, DPT  Acute Rehabilitation [phone] pager [phone]) [phone] office  @ [facility]: [phone]

## 2019-06-03 NOTE — Discharge Summary (Signed)
Physician Discharge Summary  Kristopher [name] CZY:[id number] DOB: [birth year] DOA: 05/25/2019  PCP: Alvester Chou, NP  Admit date: 05/25/2019 Discharge date: 06/03/2019  Time spent: 30 minutes  Recommendations for Outpatient Follow-up:   Acute respiratory failure with hypoxia/COVID pneumonia/positive H influenza pneumonia - On presentation patient hypoxic SPO2 88%, on 6 L via nasal cannula.  Titrated down to 2 L via nasal cannula.  Prior to discharge - Incentive spirometry and flutter valve - 8/13 patient met criteria of new onset O2 requirement, CXR with bilateral opacifications started on Remdesivir -Sputum culture positive H influenza; complete 5-day course of Rocephin -Does not meet criteria for Actemra - Continue COVID vitamin protocol -NOTE; Hx splenectomy - OT recommends SNF however patient refuses - Solu-Medrol 40 mg daily - Atrovent TID -Xopenex TID Last Labs          Recent Labs  Lab 05/30/19 0445 05/31/19 0312 06/01/19 0330 06/02/19 0305 06/03/19 0321  CRP 7.6* 5.1* 2.7* 1.4* 1.0*     Last Labs        Recent Labs  Lab 05/28/19 0140 05/29/19 0310 05/30/19 0445  DDIMER 1.78* 1.20* 0.90*    -Schedule follow-up appointment with NP Alvester Chou in 1 to 2 weeks,COVID-19 pneumonia/H influenza pneumonia, acute kidney injury SATURATION QUALIFICATIONS: (This note is used to comply with regulatory documentation for home oxygen) Patient Saturations on Room Air at Rest = 92% Patient Saturations on Room Air while Ambulating = 82% Patient Saturations on 2 Liters of oxygen while Ambulating = 90% Please briefly explain why patient needs home oxygen: Pt desaturates on RA while moving. - 2 L O2 with exertion.  Titrate to maintain SPO2> 89% - Provide Inogen O2 concentrator -Schedule follow-up appointment with NP Alvester Chou in 1 to 2 weeks,COVID-19 pneumonia/H influenza pneumonia, acute kidney injury  Acute kidney injury (patient with single kidney; s/p LEFT nephrectomy  traumatic) - Baseline Cr 1.14 (taken on 03/17/2016) - Hold all nephrotoxic medication - Monitor closely Last Labs          Recent Labs  Lab 05/30/19 0445 05/31/19 0312 06/01/19 0330 06/02/19 0305 06/03/19 0321  CREATININE 1.13 1.12 1.22 1.00 0.94    - Creatinine significant improvement better than previous creatinine reading 2017 -Patient on diuretic at home (Lasix 40 mg daily).  Currently on hold.  Will allow PCP to make adjustments - Strict in and out -Daily weight      Filed Weights   05/25/19 1059 05/26/19 0500 05/27/19 0600  Weight: 63.5 kg 61.5 kg 61.3 kg   Hyperkalemia - In setting of renal failure resolved  Essential HTN/hypotension - BP on lower side on admission antihypertensive medication held - BP began to increase - Norvasc 10 mg daily - Coreg 3.125 mg BID  Bradycardia - Resolved - EKG with LBBB appears to be old, negative CP  Dysphasia -Seen by SLP, advanced to regular diet  Moderate/severe protein calorie malnutrition - Continue with supplement.,  Nutritionist consulted  Elevated lipase -Per EMR was 127--> 43 -  8/21 lipase:= 43  Hx splenectomy - High risk bacterial infection - Has he received pneumococcal vaccination?  Anxiety/depression -Continue home meds  HLD - Simvastatin 20 mg daily - Monitor LFTs  Hypophosphatemia -Started on Neutra-Phos -8/21 phosphorus= 3.1   Tobacco abuse - Tobacco cessation counseling at bedside -Nicotine patch -Since patient will most likely need O2 at home will counsel patient in a.m. 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 As well found to be in renal failure, with a creatinine of 4, and he is with significant dysphagia as well, he was transferred to [facility] for further management During his hospitalization patient was treated for COVID pneumonia received high-dose steroids + Remdesivir patient responded well and his respiratory status improved.  However patient remains extremely weak, cachectic, and prone to falls.  Counseled patient recovery at SNF would be in his best interests but he ABSOLUTELY REFUSED.     Cultures   8/12 blood left antecubital negative final 8/12 LEFT forearm negative final 8/12 SARS coronavirus positive 8/13 respiratory culture positive Haemophilus influenza     Antibiotics Anti-infectives (From admission, onward)   Start     Stop   05/30/19 1000  cefTRIAXone (ROCEPHIN) 1 g in sodium chloride 0.9 % 100 mL IVPB     06/03/19 1053   05/26/19 1700  remdesivir 100 mg in sodium chloride 0.9 % 250 mL IVPB     05/29/19 1649   05/25/19 1700  remdesivir 200 mg in sodium chloride 0.9 % 250 mL IVPB     05/25/19 1725       Discharge Exam: Vitals:   06/03/19 0900 06/03/19 1000 06/03/19 1001 06/03/19 1100  BP:      Pulse: 85 90  82  Resp: (!) 39 (!) 29  (!) 27  Temp:      TempSrc:      SpO2: 95% (!) 78% (!) 82% 92%  Weight:      Height:        General:  A/O x4, positive acute respiratory distress Eyes: negative scleral hemorrhage, negative anisocoria, negative icterus ENT: Negative Runny nose, negative gingival bleeding, Neck:  Negative scars, masses, torticollis, lymphadenopathy, JVD Lungs:  Tachypneic with exertion, clear to auscultation bilaterally without wheezes or crackles Cardiovascular: Regular rate and rhythm without murmur gallop or rub normal S1 and S2    Discharge Instructions   Allergies as of  06/03/2019   No Known Allergies     Medication List    STOP taking these medications   amoxicillin 500 MG capsule Commonly known as: AMOXIL   furosemide 40 MG tablet Commonly known as: LASIX   lisinopril 20 MG tablet Commonly known as: ZESTRIL     TAKE these medications   ALPRAZolam 0.5 MG tablet Commonly known as: XANAX Take 1 tablet (0.5 mg total) by mouth 2 (two) times daily as needed for anxiety.   amLODipine 10 MG tablet Commonly known as: NORVASC Take 1 tablet (10 mg total) by mouth daily. Start taking on: June 04, 2019   ascorbic acid 500 MG tablet Commonly known as: VITAMIN C Take 1 tablet (500 mg total) by mouth daily.   carisoprodol 350 MG tablet Commonly known as: SOMA Take 350 mg by mouth at bedtime as needed for muscle spasms.   carvedilol 3.125 MG tablet Commonly known as: COREG Take 1 tablet (3.125 mg total) by mouth 2 (two) times daily with a meal. What changed:   medication strength  how much to take  cetirizine 10 MG tablet Commonly known as: ZYRTEC Take 10 mg by mouth daily.   FLUoxetine 20 MG capsule Commonly known as: PROZAC Take 1 capsule (20 mg total) by mouth daily. Please request next refill from PCP. Thank you   guaiFENesin 600 MG 12 hr tablet Commonly known as: MUCINEX Take 2 tablets (1,200 mg total) by mouth 2 (two) times daily.   ipratropium 17 MCG/ACT inhaler Commonly known as: ATROVENT HFA Inhale 2 puffs into the lungs every 8 (eight) hours.   levalbuterol 45 MCG/ACT inhaler Commonly known as: XOPENEX HFA Inhale 2 puffs into the lungs every 8 (eight) hours.   nicotine 7 mg/24hr patch Commonly known as: NICODERM CQ - dosed in mg/24 hr Place 1 patch (7 mg total) onto the skin at bedtime.   nicotine polacrilex 2 MG gum Commonly known as: NICORETTE Take 1 each (2 mg total) by mouth as needed for smoking cessation.   ondansetron 8 MG tablet Commonly known as: ZOFRAN Take 8 mg by mouth every 8 (eight) hours as needed  for nausea or vomiting.   potassium chloride SA 20 MEQ tablet Commonly known as: K-DUR TAKE 1 TABLET BY MOUTH ONCE A DAY   simvastatin 20 MG tablet Commonly known as: ZOCOR TAKE ONE TABLET BY MOUTH EVERY NIGHT AT BEDTIME   tamsulosin 0.4 MG Caps capsule Commonly known as: FLOMAX Take 1 capsule (0.4 mg total) by mouth daily after supper.   Vitamin D3 25 MCG tablet Commonly known as: Vitamin D Take 1 tablet (1,000 Units total) by mouth daily. Start taking on: June 04, 2019   zinc sulfate 220 (50 Zn) MG capsule Take 1 capsule (220 mg total) by mouth daily.            Durable Medical Equipment  (From admission, onward)         Start     Ordered   06/03/19 1208  For home use only DME oxygen  Once    Comments: SATURATION QUALIFICATIONS: (This note is used to comply with regulatory documentation for home oxygen) Patient Saturations on Room Air at Rest = 92% Patient Saturations on Room Air while Ambulating = 82% Patient Saturations on 2 Liters of oxygen while Ambulating = 90% Please briefly explain why patient needs home oxygen: Pt desaturates on RA while moving. - 2 L O2 with exertion.  Titrate to maintain SPO2> 89% - Provide Inogen O2 concentrator  Question Answer Comment  Length of Need Lifetime   Mode or (Route) Nasal cannula   Liters per Minute 2   Frequency Continuous (stationary and portable oxygen unit needed)   Oxygen conserving device Yes   Oxygen delivery system Gas      06/03/19 1207         No Known Allergies Follow-up Information    [facility] Follow up.   Specialty: [place] Why: A representative from [facility] will contact you to arrange start date and time for your therapy. Contact information: [place] [address] [phone]        [facility] Follow up.   Why: A representative from [facility] will deliver oxygen concentrator and tanks to your home. Should you have any questions or issues  with oxygen please call [employer] at the number listed on the tank. Contact information: [place] [address] [phone]        Alvester Chou, NP Follow up in 2 week(s).   Specialty: Nurse Practitioner Why: Schedule follow-up  appointment with NP Alvester Chou in 1 to 2 weeks,COVID-19 pneumonia/H influenza pneumonia, acute kidney injury Contact information: [place] [address] [place] [address] [phone]            The results of significant diagnostics from this hospitalization (including imaging, microbiology, ancillary and laboratory) are listed below for reference.    Significant Diagnostic Studies: Ct Abdomen Pelvis Wo Contrast  Result Date: 05/25/2019 CLINICAL DATA:  73 year old male with history of smoking presenting with nausea vomiting and diarrhea. Patient was found to be hypoxic and COVID-19 positive. EXAM: CT ABDOMEN AND PELVIS WITHOUT CONTRAST TECHNIQUE: Multidetector CT imaging of the abdomen and pelvis was performed following the standard protocol without IV contrast. COMPARISON:  CT of the abdomen pelvis dated 08/21/2012. Chest radiograph dated 05/25/2019 FINDINGS: Evaluation of this exam is limited in the absence of intravenous contrast. Evaluation is also limited due to streak artifact caused by patient's arms as well as respiratory motion artifact. Lower chest: There is emphysematous changes of the lungs. Left lung base densities, new since the study of 2013 and concerning for superimposed infectious process. Clinical correlation is recommended. There is multi vessel coronary vascular calcification. No intra-abdominal free air or free fluid. Hepatobiliary: The liver is unremarkable. No intrahepatic biliary ductal dilatation. Cholecystectomy. Pancreas: Unremarkable. No pancreatic ductal dilatation or surrounding inflammatory changes. Spleen: Status post prior splenectomy with multiple residual splenic tissue.  Adrenals/Urinary Tract: The adrenal glands are unremarkable. Postsurgical changes of prior left nephrectomy. Several right renal lesions which are not characterized on this unenhanced CT. The largest lesion in the upper pole of the right kidney measures up to 3.3 cm and demonstrates a fluid attenuation most consistent with a cyst. Several smaller high attenuating lesions are not characterized but may represent complex/hemorrhagic cysts. Ultrasound may provide better characterization. There is no hydronephrosis or nephrolithiasis. Renal vascular calcifications noted. The right ureter and urinary bladder appear unremarkable. Stomach/Bowel: There is no bowel obstruction or active inflammation. The appendix is not visualized with certainty. No inflammatory changes identified in the right lower quadrant. Vascular/Lymphatic: There is advanced atherosclerotic calcification of the aorta and iliac arteries. The IVC is unremarkable. No portal venous gas. There is no adenopathy. Reproductive: Mildly enlarged prostate gland measuring approximately 4.3 cm in transverse axial diameter. Other: Midline vertical anterior pelvic wall incisional scar. There is a small fat containing ventral hernia the left of the midline. The neck of the hernia defect measures approximately 17 mm in transverse axial diameter. There is small amount of fluid and mild stranding of the herniated fat. Correlation with clinical exam is recommended to exclude strangulation or incarceration. No herniated bowel identified. Musculoskeletal: Advanced osteopenia. Multilevel degenerative changes of the spine with enthesopathy and findings consistent with ankylosing spondylitis. No acute fracture. IMPRESSION: 1. Increased left lung base densities concerning for developing infiltrate. Clinical correlation is recommended. 2. No acute intra-abdominal or pelvic pathology. No bowel obstruction or active inflammation. 3. Small fat containing ventral hernia with mild  stranding of the herniated fat. Correlation with clinical exam is recommended to exclude strangulation or incarceration. No herniated bowel. 4. Aortic Atherosclerosis (ICD10-I70.0) and Emphysema (ICD10-J43.9). Electronically Signed   By: Anner Crete M.D.   On: 05/25/2019 21:43   Dg Chest Port 1 View  Result Date: 06/01/2019 CLINICAL DATA:  Hypoxia. EXAM: PORTABLE CHEST 1 VIEW COMPARISON:  Radiograph of May 30, 2019. FINDINGS: The heart size and mediastinal contours are within normal limits. No pneumothorax or pleural effusion is noted. Bilateral  lung opacities noted on prior exam have nearly resolved. The visualized skeletal structures are unremarkable. IMPRESSION: Bilateral lung opacities noted on prior exam have nearly resolved. Aortic Atherosclerosis (ICD10-I70.0). Electronically Signed   By: Marijo Conception M.D.   On: 06/01/2019 08:41   Dg Chest Port 1 View  Result Date: 05/30/2019 CLINICAL DATA:  Hypoxia. EXAM: PORTABLE CHEST 1 VIEW COMPARISON:  05/26/2019. FINDINGS: The heart remains normal in size. Diffusely increased prominence of the interstitial markings in the right lung with no significant change in diffuse prominence of the interstitial markings in the left lung. Small left pleural effusion with improvement. Diffuse osteopenia. Left upper quadrant abdominal surgical clips. IMPRESSION: Mildly progressive diffuse interstitial prominence on the right, stable on the left. Differential considerations include viral pneumonitis, drug reaction and pulmonary edema. Electronically Signed   By: Claudie Revering M.D.   On: 05/30/2019 08:18   Dg Chest Port 1 View  Result Date: 05/26/2019 CLINICAL DATA:  Hypoxia EXAM: PORTABLE CHEST 1 VIEW COMPARISON:  CT 04/12/2014, radiograph 05/25/2019. FINDINGS: Worsening interstitial opacities throughout both lungs, favored to reflect edema on a background of emphysematous change seen on prior CT. Apical pleuroparenchymal scarring is present. Abundant pericardial  fat pad in the left lung base. The heart appears slightly enlarged from prior portable technique as do the central pulmonary arteries. Surgical clips are present in the left upper quadrant. Bones are diffusely demineralized. No acute osseous or soft tissue abnormality. IMPRESSION: Worsening interstitial opacities throughout both lungs, favored to reflect worsening edema on a background of emphysematous change seen on prior CT. Electronically Signed   By: Lovena Le M.D.   On: 05/26/2019 14:33   Dg Chest Port 1 View  Result Date: 05/25/2019 CLINICAL DATA:  Increased shortness of breath EXAM: PORTABLE CHEST 1 VIEW COMPARISON:  04/11/2019 FINDINGS: Cardiac shadow is stable. Aortic calcifications are noted. Increased interstitial markings are seen particularly on the left likely related to underlying edema. No sizable effusion is seen. No bony abnormality is noted. IMPRESSION: Increasing interstitial changes likely related to underlying edema. Electronically Signed   By: Inez Catalina M.D.   On: 05/25/2019 10:46    Microbiology: Recent Results (from the past 240 hour(s))  Blood Culture (routine x 2)     Status: None   Collection Time: 05/25/19 10:22 AM   Specimen: BLOOD  Result Value Ref Range Status   Specimen Description   Final    BLOOD LEFT ANTECUBITAL Performed at [facility] [address] [place], [place] [address]    Special Requests   Final    BOTTLES DRAWN AEROBIC AND ANAEROBIC Blood Culture adequate volume Performed at [facility] [address] [place], [place] [address]    Culture   Final    NO GROWTH 5 DAYS Performed at [facility], [place] [address], [place], [place] [address]    Report Status 05/30/2019 FINAL  Final  Blood Culture (routine x 2)     Status: None   Collection Time: 05/25/19 10:22 AM   Specimen: BLOOD LEFT FOREARM  Result Value Ref Range Status   Specimen Description   Final    BLOOD LEFT FOREARM Performed at  [facility], [place] [address], [place], [place] [address]    Special Requests   Final    BOTTLES DRAWN AEROBIC AND ANAEROBIC Blood Culture adequate volume Performed at [facility] [address], [place], [place] [address]    Culture   Final  NO GROWTH 5 DAYS Performed at [facility], [place] [address], [place], [place] [address]    Report Status 05/30/2019 FINAL  Final  SARS Coronavirus 2 [facility] order, Performed in [facility] hospital lab) Nasopharyngeal Nasopharyngeal Swab     Status: Abnormal   Collection Time: 05/25/19 10:58 AM   Specimen: Nasopharyngeal Swab  Result Value Ref Range Status   SARS Coronavirus 2 POSITIVE (A) NEGATIVE Final    Comment: RESULT CALLED TO, READ BACK BY AND VERIFIED WITH: TIM SMITH,RN [id number] @ 8756 BY J SCOTTON (NOTE) If result is NEGATIVE SARS-CoV-2 target nucleic acids are NOT DETECTED. The SARS-CoV-2 RNA is generally detectable in upper and lower  respiratory specimens during the acute phase of infection. The lowest  concentration of SARS-CoV-2 viral copies this assay can detect is 250  copies / mL. A negative result does not preclude SARS-CoV-2 infection  and should not be used as the sole basis for treatment or other  patient management decisions.  A negative result may occur with  improper specimen collection / handling, submission of specimen other  than nasopharyngeal swab, presence of viral mutation(s) within the  areas targeted by this assay, and inadequate number of viral copies  (<250 copies / mL). A negative result must be combined with clinical  observations, patient history, and epidemiological information. If result is POSITIVE SARS-CoV-2 target nucleic acids are DETECTED . The SARS-CoV-2 RNA is generally detectable in upper and lower  respiratory specimens during the acute phase of infection.  Positive  results are indicative of active infection with SARS-CoV-2.  Clinical  correlation with  patient history and other diagnostic information is  necessary to determine patient infection status.  Positive results do  not rule out bacterial infection or co-infection with other viruses. If result is PRESUMPTIVE POSTIVE SARS-CoV-2 nucleic acids MAY BE PRESENT.   A presumptive positive result was obtained on the submitted specimen  and confirmed on repeat testing.  While 2019 novel coronavirus  (SARS-CoV-2) nucleic acids may be present in the submitted sample  additional confirmatory testing may be necessary for epidemiological  and / or clinical management purposes  to differentiate between  SARS-CoV-2 and other Sarbecovirus currently known to infect humans.  If clinically indicated additional testing with an alternate test  methodology [phone] ) is advised. The SARS-CoV-2 RNA is generally  detectable in upper and lower respiratory specimens during the acute  phase of infection. The expected result is Negative. Fact Sheet for Patients:  StrictlyIdeas.no Fact Sheet for Healthcare Providers: BankingDealers.co.za This test is not yet approved or cleared by the [place] FDA and has been authorized for detection and/or diagnosis of SARS-CoV-2 by FDA under an Emergency Use Authorization (EUA).  This EUA will remain in effect (meaning this test can be used) for the duration of the COVID-19 declaration under Section 564(b)(1) of the Act, 21 U.S.C. section 360bbb-3(b)(1), unless the authorization is terminated or revoked sooner. Performed at [facility], [place] [address], [place], [place] [address]   Urine culture     Status: None   Collection Time: 05/25/19 10:52 PM   Specimen: Urine, Random  Result Value Ref Range Status   Specimen Description   Final    URINE, RANDOM Performed at [facility] [address] [place], [place] [address]    Special Requests   Final    NONE Performed at  [facility], [place] [address], [place], [place] [address]    Culture  Final    NO GROWTH Performed at [facility], [place] [address] [place], [place] [address]    Report Status 05/26/2019 FINAL  Final  Expectorated sputum assessment w rflx to resp cult     Status: None   Collection Time: 05/26/19  1:07 AM   Specimen: Sputum  Result Value Ref Range Status   Specimen Description SPUTUM  Final   Special Requests NONE  Final   Sputum evaluation   Final    THIS SPECIMEN IS ACCEPTABLE FOR SPUTUM CULTURE Performed at [facility], [place] [address], [place], [place] [address]    Report Status 05/26/2019 FINAL  Final  Culture, respiratory     Status: None   Collection Time: 05/26/19  1:07 AM   Specimen: SPU  Result Value Ref Range Status   Specimen Description   Final    SPUTUM Performed at [facility] [address], [place], [place] [address]    Special Requests   Final    NONE Reflexed from [id number] Performed at [facility], [place] [address] [place], [place] [address]    Gram Stain   Final    RARE WBC PRESENT, PREDOMINANTLY PMN FEW GRAM NEGATIVE RODS FEW GRAM POSITIVE COCCI IN CLUSTERS FEW GRAM NEGATIVE COCCOBACILLI    Culture   Final    MODERATE HAEMOPHILUS INFLUENZAE BETA LACTAMASE POSITIVE Performed at [facility], [address], [place], [place] [address]    Report Status 05/28/2019 FINAL  Final     Labs: Basic Metabolic Panel: Recent Labs  Lab 05/28/19 0140 05/29/19 0310 05/30/19 0445 05/30/19 2110 05/31/19 0312 05/31/19 2310 06/01/19 0330 06/02/19 0305 06/03/19 0321  NA 143 145 145  --  141  --  142 146* 145  K 4.2 4.3 4.4  --  4.3  --  4.0 4.0 4.3  CL 112* 112* 109  --  96*  --  97* 99 105  CO2 25 24 27   --  33*  --  33* 34* 29  GLUCOSE 157* 121* 130*  --  152*  --  153* 112* 94  BUN 51* 51* 50*  --  55*  --  56* 51* 40*  CREATININE 1.37* 1.09 1.13  --  1.12  --  1.22  1.00 0.94  CALCIUM 8.3* 8.5* 8.3*  --  8.7*  --  8.7* 8.7* 8.7*  MG 2.2 2.0 1.9 1.9  --  1.9  --   --   --   PHOS 2.6 1.9* 2.9  --   --   --   --   --  3.1   Liver Function Tests: Recent Labs  Lab 05/30/19 0445 05/31/19 0312 06/01/19 0330 06/02/19 0305 06/03/19 0321  AST 16 16 17 18 19   ALT 18 21 20 24 30   ALKPHOS 66 69 65 70 73  BILITOT 0.8 0.9 0.8 1.0 0.7  PROT 5.9* 6.9 6.5 6.7 6.3*  ALBUMIN 2.7* 3.0* 2.9* 3.1* 2.9*   Recent Labs  Lab 06/03/19 0321  LIPASE 43   No results for input(s): AMMONIA in the last 168 hours. CBC: Recent Labs  Lab 05/28/19 0140 05/29/19 0310 05/30/19 0445 05/31/19 0312 06/01/19 0330 06/02/19 0305 06/03/19 0321  WBC 16.8* 18.2* 11.5* 9.4 12.1* 16.8* 18.3*  NEUTROABS 15.4* 15.4* 10.2*  --   --   --   --   HGB 14.1 15.4 14.2 16.2 16.2 16.1 16.2  HCT 43.6 47.9 42.9 48.1 47.7 49.3 48.6  MCV 92.2 91.2 88.8 87.5 87.8 89.6 89.2  PLT 344 351 311 331 333 335 304   Cardiac Enzymes: No results for input(s): CKTOTAL, CKMB, CKMBINDEX, TROPONINI in the last 168 hours. BNP: BNP (last 3 results) Recent Labs    05/25/19 1022 05/30/19 0445 05/31/19 0312  BNP 33.4 1,026.4* 458.9*    ProBNP (last 3 results) No results for input(s): PROBNP in the last 8760 hours.  CBG: No results for input(s): GLUCAP in the last 168 hours.     Signed:  Dia Crawford, MD [facility] [phone] pager

## 2019-06-03 NOTE — Progress Notes (Signed)
Occupational Therapy Treatment Patient Details Name: Kristopher [name] MRN: [id number] DOB: 1946/09/26 Today's Date: 06/03/2019    History of present illness 73 y.o. male admitted on 05/25/19 for SOB, weakness, emesis.  Pt dx with COVID 19 PNA, AKI, hyperkalemia, bradycardia, hypotension.  Pt with significant PMH of HOH (R ear better than L), HTN, depression, cardiomyopathy.     OT comments  Pt progressing towards OT goals this session. Pt able to perform toilet transfer at supervision level with [facility] today, on 2LO2 throughout session and spent time focused on how much better he feels and how much better it is for his body to maintain SpO2. Pt supervision for standing grooming tasks. PTAR arriving at end of session. OT is grateful for the opportunity to serve this patient.   Follow Up Recommendations  Home health OT;Supervision - Intermittent    Equipment Recommendations  Tub/shower seat    Recommendations for Other Services PT consult    Precautions / Restrictions Precautions Precautions: Fall;Other (comment) Precaution Comments: LOW HEALTH IQ, monitor O2 sats Restrictions Weight Bearing Restrictions: No       Mobility Bed Mobility               General bed mobility comments: Pt was OOB in the recliner chair at beginning and end of session  Transfers Overall transfer level: Needs assistance Equipment used: Straight cane Transfers: Sit to/from Stand Sit to Stand: Supervision         General transfer comment: supervision for safety, pt reliant on hands for transitions from recliner and toilet    Balance Overall balance assessment: Needs assistance Sitting-balance support: Feet supported;No upper extremity supported Sitting balance-Leahy Scale: Good     Standing balance support: Single extremity supported Standing balance-Leahy Scale: Poor Standing balance comment: needs at least one hand supported for balance in standing.                             ADL either performed or assessed with clinical judgement   ADL Overall ADL's : Needs assistance/impaired     Grooming: Wash/dry hands;Wash/dry face;Oral care;Min guard;Standing Grooming Details (indicate cue type and reason): standing at sink, better endurance with O2 - Pt verbalized agreement and understanding of need for increased O2 during activity                 Toilet Transfer: Supervision/safety;Ambulation;Regular Toilet(straight cane) Toilet Transfer Details (indicate cue type and reason): into bathroom Toileting- Clothing Manipulation and Hygiene: Supervision/safety;Sit to/from stand Toileting - Clothing Manipulation Details (indicate cue type and reason): standing for urination     Functional mobility during ADLs: Supervision/safety;Cane       Vision       Perception     Praxis      Cognition Arousal/Alertness: Awake/alert Behavior During Therapy: WFL for tasks assessed/performed Overall Cognitive Status: Within Functional Limits for tasks assessed                                 General Comments: Likely close to baseline        Exercises     Shoulder Instructions       General Comments      Pertinent Vitals/ Pain       Pain Assessment: No/denies pain  Home Living  Prior Functioning/Environment              Frequency  Min 3X/week        Progress Toward Goals  OT Goals(current goals can now be found in the care plan section)  Progress towards OT goals: Progressing toward goals  Acute Rehab OT Goals Patient Stated Goal: to go home and sit on his porch, go fishing OT Goal Formulation: With patient Time For Goal Achievement: 06/16/19 Potential to Achieve Goals: Good  Plan Discharge plan remains appropriate;Frequency remains appropriate    Co-evaluation                 AM-PAC OT "6 Clicks" Daily Activity     Outcome Measure   Help from another  person eating meals?: None Help from another person taking care of personal grooming?: A Little Help from another person toileting, which includes using toliet, bedpan, or urinal?: A Little Help from another person bathing (including washing, rinsing, drying)?: A Little Help from another person to put on and taking off regular upper body clothing?: None Help from another person to put on and taking off regular lower body clothing?: A Little 6 Click Score: 20    End of Session Equipment Utilized During Treatment: Gait belt;Other (comment)(straight cane)  OT Visit Diagnosis: Unsteadiness on feet (R26.81);Other abnormalities of gait and mobility (R26.89);Muscle weakness (generalized) (M62.81);Other symptoms and signs involving cognitive function   Activity Tolerance Patient tolerated treatment well   Patient Left Other (comment)(EMS staff coming in for transport home)   Nurse Communication Mobility status        Time: [id number] OT Time Calculation (min): 16 min  Charges: OT General Charges $OT Visit: 1 Visit OT Treatments $Self Care/Home Management : 8-22 mins  Hulda Humphrey OTR/L Acute Rehabilitation Services Pager: [phone] Office: [place] 06/03/2019, 5:51 PM

## 2019-06-03 NOTE — Progress Notes (Signed)
Physical Therapy Treatment Patient Details Name: Kristopher [name] MRN: [id number] DOB: 10/05/1946 Today's Date: 06/03/2019    History of Present Illness 73 y.o. male admitted on 05/25/19 for SOB, weakness, emesis.  Pt dx with COVID 19 PNA, AKI, hyperkalemia, bradycardia, hypotension.  Pt with significant PMH of HOH (R ear better than L), HTN, depression, cardiomyopathy.      PT Comments    Pt looking more steady on his feet with use of the cane today walking 130' with two standing rest breaks.  He does need supplemental O2 to maintain sats during mobility in the 90s (see separate O2 note).  He is excited at the possibility of going home today.  T-band UE/LE HEP issued and reviewed.  PT will continue to follow acutely for safe mobility progression  Follow Up Recommendations  Home health PT;Other (comment)(HHOT and an aide if he qualifies)     Equipment Recommendations  3in1 (PT)(home O2)    Recommendations for Other Services   NA     Precautions / Restrictions Precautions Precautions: Fall;Other (comment) Precaution Comments: LOW HEALTH IQ, monitor O2 sats    Mobility  Bed Mobility               General bed mobility comments: Pt was OOB in the recliner chair.  Transfers Overall transfer level: Needs assistance Equipment used: Straight cane Transfers: Sit to/from Stand Sit to Stand: Supervision         General transfer comment: supervision for safety, pt reliant on hands for transitions from recliner and toilet  Ambulation/Gait Ambulation/Gait assistance: Min guard Gait Distance (Feet): 130 Feet Assistive device: Straight cane Gait Pattern/deviations: Step-through pattern;Staggering left;Staggering right Gait velocity: decreased Gait velocity interpretation: <1.8 ft/sec, indicate of risk for recurrent falls General Gait Details: Pt with mildly staggering gait pattern, much improved over last session with me two days ago, he took two standing rest breaks and did  require 2 L O2 [place] to keep O2 sats in the 90s during mobility as measured by pediatric finger probe          Balance Overall balance assessment: Needs assistance Sitting-balance support: Feet supported;No upper extremity supported Sitting balance-Leahy Scale: Good     Standing balance support: Single extremity supported Standing balance-Leahy Scale: Poor Standing balance comment: needs at least one hand supported for balance in standing.                             Cognition Arousal/Alertness: Awake/alert Behavior During Therapy: WFL for tasks assessed/performed                                   General Comments: Likely close to baseline      Exercises General Exercises - Upper Extremity Shoulder Flexion: AROM;Both;10 reps;Seated;Theraband Theraband Level (Shoulder Flexion): Level 1 (Yellow) Shoulder ABduction: AROM;Both;10 reps;Seated;Theraband Theraband Level (Shoulder Abduction): Level 1 (Yellow) Elbow Flexion: AROM;Both;10 reps;Seated;Theraband Theraband Level (Elbow Flexion): Level 1 (Yellow) Elbow Extension: AROM;Both;10 reps;Seated;Theraband Theraband Level (Elbow Extension): Level 1 (Yellow) General Exercises - Lower Extremity Long Arc Quad: AROM;Both;10 reps;Seated Hip ABduction/ADduction: AROM;Both;10 reps;Seated Straight Leg Raises: AROM;Both;10 reps;Seated Hip Flexion/Marching: AROM;Both;10 reps;Seated        Pertinent Vitals/Pain Pain Assessment: No/denies pain           PT Goals (current goals can now be found in the care plan section) Acute Rehab PT Goals Patient Stated Goal:  to go home and sit on his porch, go fishing Progress towards PT goals: Progressing toward goals    Frequency    Min 3X/week      PT Plan Current plan remains appropriate       AM-PAC PT "6 Clicks" Mobility   Outcome Measure  Help needed turning from your back to your side while in a flat bed without using bedrails?: A Little Help  needed moving from lying on your back to sitting on the side of a flat bed without using bedrails?: A Little Help needed moving to and from a bed to a chair (including a wheelchair)?: A Little Help needed standing up from a chair using your arms (e.g., wheelchair or bedside chair)?: None Help needed to walk in hospital room?: A Little Help needed climbing 3-5 steps with a railing? : A Little 6 Click Score: 19    End of Session Equipment Utilized During Treatment: Oxygen(2) Activity Tolerance: Patient limited by fatigue Patient left: in chair;with call bell/phone within reach   PT Visit Diagnosis: Muscle weakness (generalized) (M62.81);Difficulty in walking, not elsewhere classified (R26.2);Unsteadiness on feet (R26.81)     Time: [id number] PT Time Calculation (min) (ACUTE ONLY): 31 min  Charges:  $Gait Training: 8-22 mins $Therapeutic Exercise: 8-22 mins                    Datrell Dunton B. Rockwell Zentz, PT, DPT  Acute Rehabilitation [phone] pager [phone] office  @ [place]: [phone]   06/03/2019, 10:06 AM

## 2020-01-04 ENCOUNTER — Other Ambulatory Visit: Payer: Self-pay | Admitting: Internal Medicine

## 2020-01-23 ENCOUNTER — Other Ambulatory Visit: Payer: Self-pay | Admitting: Internal Medicine

## 2020-02-21 ENCOUNTER — Other Ambulatory Visit: Payer: Self-pay | Admitting: Internal Medicine

## 2020-02-29 ENCOUNTER — Other Ambulatory Visit: Payer: Self-pay | Admitting: Internal Medicine

## 2020-07-15 IMAGING — DX PORTABLE CHEST - 1 VIEW
1 series · 1 of 1 positions shown · non-contrast
Comparison: 05/26/2019.

CLINICAL DATA: Hypoxia.

EXAM:
PORTABLE CHEST 1 VIEW

[chest]
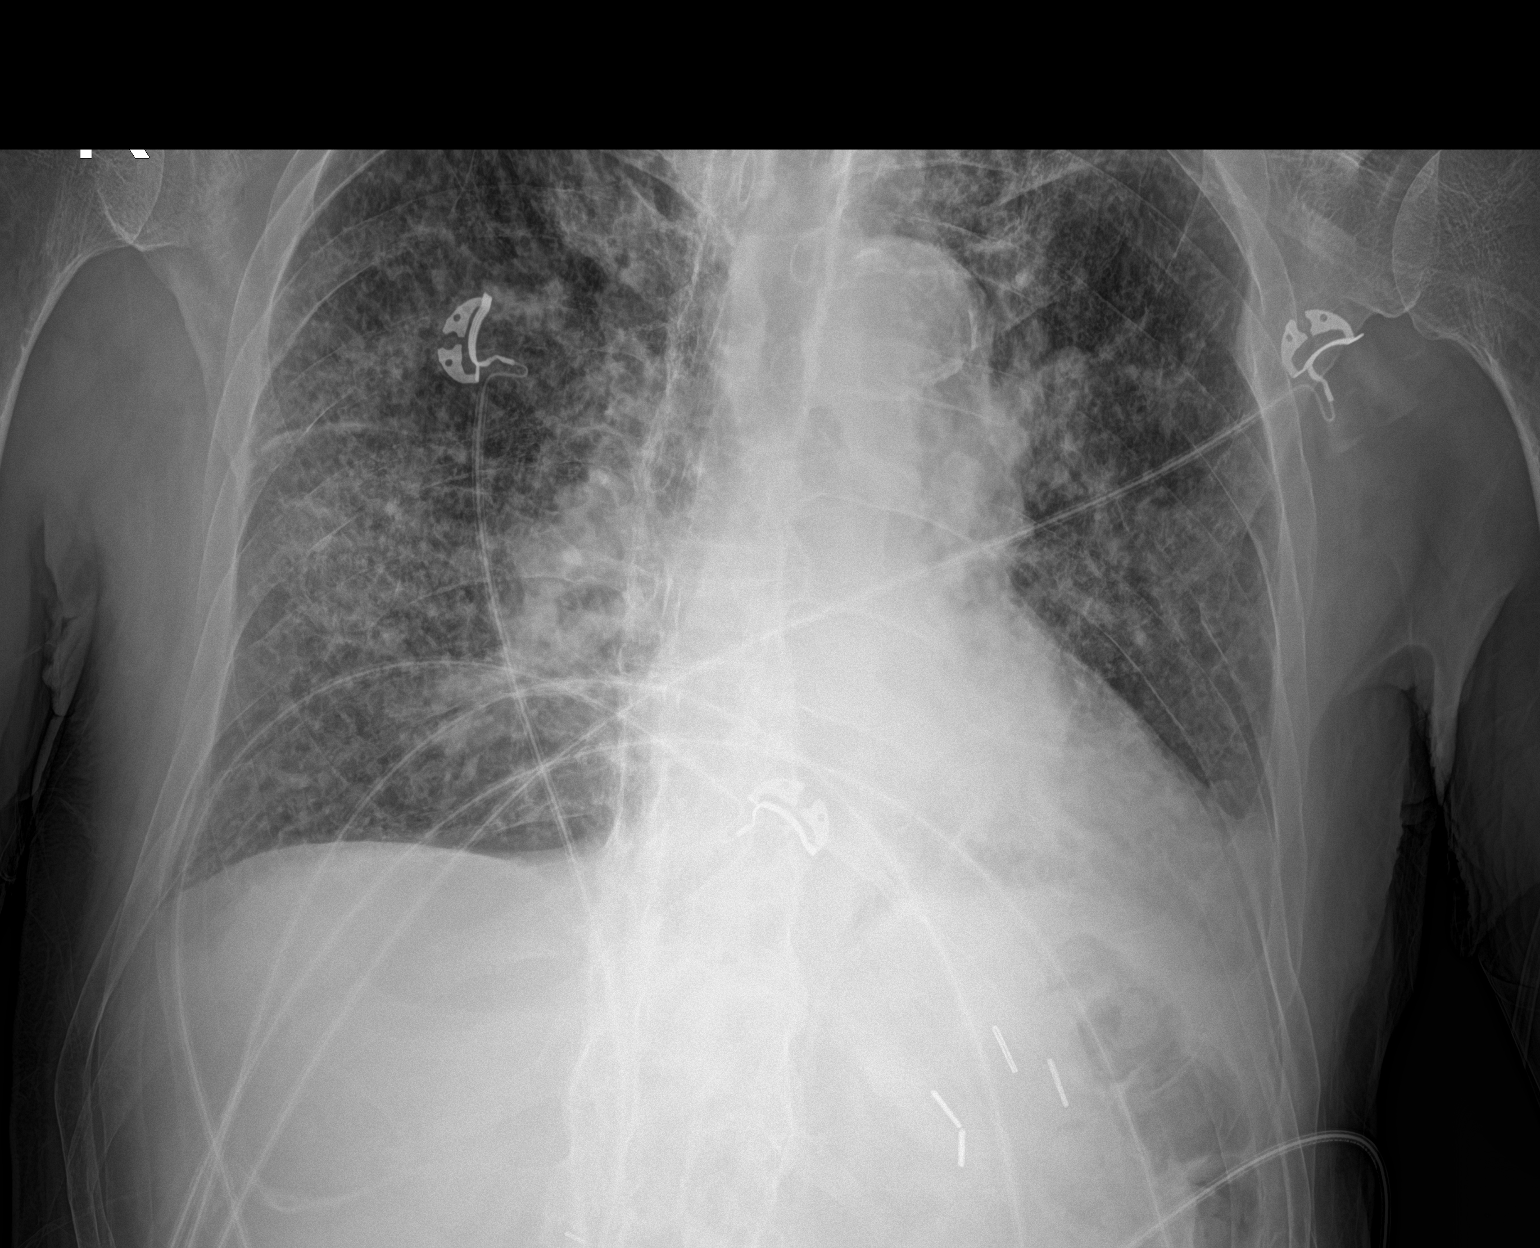

[1 of 1 positions shown; findings below may reference images not displayed]

FINDINGS: The heart remains normal in size. Diffusely increased prominence of
the interstitial markings in the right lung with no significant
change in diffuse prominence of the interstitial markings in the
left lung. Small left pleural effusion with improvement. Diffuse
osteopenia. Left upper quadrant abdominal surgical clips.
IMPRESSION: Mildly progressive diffuse interstitial prominence on the right,
stable on the left. Differential considerations include viral
pneumonitis, drug reaction and pulmonary edema.

## 2021-03-26 ENCOUNTER — Telehealth: Payer: Self-pay | Admitting: Family Medicine

## 2021-03-26 NOTE — Telephone Encounter (Signed)
Called pt at one of his emergency contact numbers to leave a messg about scheduling a new pt appt with Dr. Dennard Schaumann. Tried to call number listed in chart but vm was not set up. Will try again to contact pt to set up appt.

## 2021-03-29 ENCOUNTER — Encounter (HOSPITAL_COMMUNITY): Payer: Self-pay | Admitting: Emergency Medicine

## 2021-03-29 ENCOUNTER — Inpatient Hospital Stay (HOSPITAL_COMMUNITY): Payer: Medicare Other

## 2021-03-29 ENCOUNTER — Emergency Department (HOSPITAL_COMMUNITY): Payer: Medicare Other

## 2021-03-29 ENCOUNTER — Ambulatory Visit (INDEPENDENT_AMBULATORY_CARE_PROVIDER_SITE_OTHER): Payer: Medicare Other | Admitting: Family Medicine

## 2021-03-29 ENCOUNTER — Inpatient Hospital Stay (HOSPITAL_COMMUNITY)
Admission: EM | Admit: 2021-03-29 | Discharge: 2021-04-17 | DRG: 870 | Disposition: A | Discharge status: Other Acute Inpatient Hospital | Payer: Medicare Other | Attending: Pulmonary Disease | Admitting: Pulmonary Disease

## 2021-03-29 VITALS — HR 112 | Temp 101.4°F | Resp 30

## 2021-03-29 DIAGNOSIS — Z66 Do not resuscitate: Secondary | ICD-10-CM | POA: Diagnosis present

## 2021-03-29 DIAGNOSIS — Z9911 Dependence on respirator [ventilator] status: Secondary | ICD-10-CM | POA: Diagnosis not present

## 2021-03-29 DIAGNOSIS — I130 Hypertensive heart and chronic kidney disease with heart failure and stage 1 through stage 4 chronic kidney disease, or unspecified chronic kidney disease: Secondary | ICD-10-CM | POA: Diagnosis present

## 2021-03-29 DIAGNOSIS — J9690 Respiratory failure, unspecified, unspecified whether with hypoxia or hypercapnia: Secondary | ICD-10-CM

## 2021-03-29 DIAGNOSIS — I10 Essential (primary) hypertension: Secondary | ICD-10-CM | POA: Diagnosis not present

## 2021-03-29 DIAGNOSIS — J9621 Acute and chronic respiratory failure with hypoxia: Secondary | ICD-10-CM | POA: Diagnosis not present

## 2021-03-29 DIAGNOSIS — J9601 Acute respiratory failure with hypoxia: Secondary | ICD-10-CM | POA: Diagnosis present

## 2021-03-29 DIAGNOSIS — N189 Chronic kidney disease, unspecified: Secondary | ICD-10-CM | POA: Diagnosis present

## 2021-03-29 DIAGNOSIS — E870 Hyperosmolality and hypernatremia: Secondary | ICD-10-CM | POA: Diagnosis present

## 2021-03-29 DIAGNOSIS — Y929 Unspecified place or not applicable: Secondary | ICD-10-CM | POA: Diagnosis not present

## 2021-03-29 DIAGNOSIS — A419 Sepsis, unspecified organism: Secondary | ICD-10-CM | POA: Diagnosis present

## 2021-03-29 DIAGNOSIS — J385 Laryngeal spasm: Secondary | ICD-10-CM | POA: Diagnosis present

## 2021-03-29 DIAGNOSIS — E1165 Type 2 diabetes mellitus with hyperglycemia: Secondary | ICD-10-CM | POA: Diagnosis present

## 2021-03-29 DIAGNOSIS — J440 Chronic obstructive pulmonary disease with acute lower respiratory infection: Secondary | ICD-10-CM | POA: Diagnosis present

## 2021-03-29 DIAGNOSIS — A084 Viral intestinal infection, unspecified: Secondary | ICD-10-CM | POA: Diagnosis present

## 2021-03-29 DIAGNOSIS — F32A Depression, unspecified: Secondary | ICD-10-CM | POA: Diagnosis present

## 2021-03-29 DIAGNOSIS — Z8249 Family history of ischemic heart disease and other diseases of the circulatory system: Secondary | ICD-10-CM

## 2021-03-29 DIAGNOSIS — R0603 Acute respiratory distress: Secondary | ICD-10-CM | POA: Diagnosis present

## 2021-03-29 DIAGNOSIS — X58XXXA Exposure to other specified factors, initial encounter: Secondary | ICD-10-CM | POA: Diagnosis present

## 2021-03-29 DIAGNOSIS — J690 Pneumonitis due to inhalation of food and vomit: Secondary | ICD-10-CM | POA: Diagnosis present

## 2021-03-29 DIAGNOSIS — Z0189 Encounter for other specified special examinations: Secondary | ICD-10-CM

## 2021-03-29 DIAGNOSIS — J441 Chronic obstructive pulmonary disease with (acute) exacerbation: Secondary | ICD-10-CM | POA: Diagnosis present

## 2021-03-29 DIAGNOSIS — Z79899 Other long term (current) drug therapy: Secondary | ICD-10-CM

## 2021-03-29 DIAGNOSIS — R338 Other retention of urine: Secondary | ICD-10-CM | POA: Diagnosis present

## 2021-03-29 DIAGNOSIS — Z978 Presence of other specified devices: Secondary | ICD-10-CM

## 2021-03-29 DIAGNOSIS — E43 Unspecified severe protein-calorie malnutrition: Secondary | ICD-10-CM

## 2021-03-29 DIAGNOSIS — R6520 Severe sepsis without septic shock: Secondary | ICD-10-CM | POA: Diagnosis not present

## 2021-03-29 DIAGNOSIS — Z9081 Acquired absence of spleen: Secondary | ICD-10-CM

## 2021-03-29 DIAGNOSIS — R140 Abdominal distension (gaseous): Secondary | ICD-10-CM

## 2021-03-29 DIAGNOSIS — N170 Acute kidney failure with tubular necrosis: Secondary | ICD-10-CM | POA: Diagnosis not present

## 2021-03-29 DIAGNOSIS — E1122 Type 2 diabetes mellitus with diabetic chronic kidney disease: Secondary | ICD-10-CM | POA: Diagnosis present

## 2021-03-29 DIAGNOSIS — Z905 Acquired absence of kidney: Secondary | ICD-10-CM

## 2021-03-29 DIAGNOSIS — R7989 Other specified abnormal findings of blood chemistry: Secondary | ICD-10-CM

## 2021-03-29 DIAGNOSIS — J9600 Acute respiratory failure, unspecified whether with hypoxia or hypercapnia: Secondary | ICD-10-CM | POA: Diagnosis not present

## 2021-03-29 DIAGNOSIS — Z20822 Contact with and (suspected) exposure to covid-19: Secondary | ICD-10-CM | POA: Diagnosis present

## 2021-03-29 DIAGNOSIS — I5030 Unspecified diastolic (congestive) heart failure: Secondary | ICD-10-CM | POA: Diagnosis not present

## 2021-03-29 DIAGNOSIS — T17998A Other foreign object in respiratory tract, part unspecified causing other injury, initial encounter: Secondary | ICD-10-CM | POA: Diagnosis not present

## 2021-03-29 DIAGNOSIS — R0902 Hypoxemia: Secondary | ICD-10-CM

## 2021-03-29 DIAGNOSIS — E875 Hyperkalemia: Secondary | ICD-10-CM | POA: Diagnosis not present

## 2021-03-29 DIAGNOSIS — I5032 Chronic diastolic (congestive) heart failure: Secondary | ICD-10-CM | POA: Diagnosis present

## 2021-03-29 DIAGNOSIS — N401 Enlarged prostate with lower urinary tract symptoms: Secondary | ICD-10-CM | POA: Diagnosis present

## 2021-03-29 DIAGNOSIS — N179 Acute kidney failure, unspecified: Secondary | ICD-10-CM | POA: Diagnosis present

## 2021-03-29 DIAGNOSIS — Z4659 Encounter for fitting and adjustment of other gastrointestinal appliance and device: Secondary | ICD-10-CM

## 2021-03-29 DIAGNOSIS — Z6821 Body mass index (BMI) 21.0-21.9, adult: Secondary | ICD-10-CM

## 2021-03-29 DIAGNOSIS — E876 Hypokalemia: Secondary | ICD-10-CM | POA: Diagnosis not present

## 2021-03-29 DIAGNOSIS — F17210 Nicotine dependence, cigarettes, uncomplicated: Secondary | ICD-10-CM | POA: Diagnosis present

## 2021-03-29 DIAGNOSIS — R1310 Dysphagia, unspecified: Secondary | ICD-10-CM | POA: Diagnosis present

## 2021-03-29 DIAGNOSIS — E785 Hyperlipidemia, unspecified: Secondary | ICD-10-CM | POA: Diagnosis present

## 2021-03-29 DIAGNOSIS — F411 Generalized anxiety disorder: Secondary | ICD-10-CM | POA: Diagnosis present

## 2021-03-29 DIAGNOSIS — Z515 Encounter for palliative care: Secondary | ICD-10-CM | POA: Diagnosis not present

## 2021-03-29 DIAGNOSIS — K5900 Constipation, unspecified: Secondary | ICD-10-CM | POA: Diagnosis not present

## 2021-03-29 DIAGNOSIS — Z01818 Encounter for other preprocedural examination: Secondary | ICD-10-CM

## 2021-03-29 DIAGNOSIS — F410 Panic disorder [episodic paroxysmal anxiety] without agoraphobia: Secondary | ICD-10-CM | POA: Diagnosis present

## 2021-03-29 DIAGNOSIS — Z8616 Personal history of COVID-19: Secondary | ICD-10-CM

## 2021-03-29 DIAGNOSIS — K219 Gastro-esophageal reflux disease without esophagitis: Secondary | ICD-10-CM | POA: Diagnosis present

## 2021-03-29 DIAGNOSIS — J189 Pneumonia, unspecified organism: Secondary | ICD-10-CM | POA: Diagnosis present

## 2021-03-29 DIAGNOSIS — J449 Chronic obstructive pulmonary disease, unspecified: Secondary | ICD-10-CM | POA: Diagnosis not present

## 2021-03-29 DIAGNOSIS — R0602 Shortness of breath: Secondary | ICD-10-CM

## 2021-03-29 DIAGNOSIS — I2510 Atherosclerotic heart disease of native coronary artery without angina pectoris: Secondary | ICD-10-CM | POA: Diagnosis present

## 2021-03-29 DIAGNOSIS — L570 Actinic keratosis: Secondary | ICD-10-CM | POA: Diagnosis not present

## 2021-03-29 DIAGNOSIS — Z781 Physical restraint status: Secondary | ICD-10-CM

## 2021-03-29 DIAGNOSIS — Z7189 Other specified counseling: Secondary | ICD-10-CM | POA: Diagnosis not present

## 2021-03-29 DIAGNOSIS — L602 Onychogryphosis: Secondary | ICD-10-CM

## 2021-03-29 LAB — URINALYSIS, ROUTINE W REFLEX MICROSCOPIC
Bacteria, UA: NONE SEEN
Bilirubin Urine: NEGATIVE
Glucose, UA: NEGATIVE mg/dL
Ketones, ur: NEGATIVE mg/dL
Nitrite: NEGATIVE
Protein, ur: 30 mg/dL — AB
Specific Gravity, Urine: 1.015 (ref 1.005–1.030)
pH: 5 (ref 5.0–8.0)

## 2021-03-29 LAB — CBC WITH DIFFERENTIAL/PLATELET
Abs Immature Granulocytes: 0.1 10*3/uL — ABNORMAL HIGH (ref 0.00–0.07)
Basophils Absolute: 0 10*3/uL (ref 0.0–0.1)
Basophils Relative: 0 %
Eosinophils Absolute: 0 10*3/uL (ref 0.0–0.5)
Eosinophils Relative: 0 %
HCT: 51.5 % (ref 39.0–52.0)
Hemoglobin: 16.9 g/dL (ref 13.0–17.0)
Immature Granulocytes: 1 %
Lymphocytes Relative: 12 %
Lymphs Abs: 2 10*3/uL (ref 0.7–4.0)
MCH: 28.8 pg (ref 26.0–34.0)
MCHC: 32.8 g/dL (ref 30.0–36.0)
MCV: 87.7 fL (ref 80.0–100.0)
Monocytes Absolute: 2.1 10*3/uL — ABNORMAL HIGH (ref 0.1–1.0)
Monocytes Relative: 12 %
Neutro Abs: 12.7 10*3/uL — ABNORMAL HIGH (ref 1.7–7.7)
Neutrophils Relative %: 75 %
Platelets: 417 10*3/uL — ABNORMAL HIGH (ref 150–400)
RBC: 5.87 MIL/uL — ABNORMAL HIGH (ref 4.22–5.81)
RDW: 14.5 % (ref 11.5–15.5)
WBC: 16.9 10*3/uL — ABNORMAL HIGH (ref 4.0–10.5)
nRBC: 0 % (ref 0.0–0.2)

## 2021-03-29 LAB — COMPREHENSIVE METABOLIC PANEL
ALT: 44 U/L (ref 0–44)
AST: 76 U/L — ABNORMAL HIGH (ref 15–41)
Albumin: 3.2 g/dL — ABNORMAL LOW (ref 3.5–5.0)
Alkaline Phosphatase: 138 U/L — ABNORMAL HIGH (ref 38–126)
Anion gap: 9 (ref 5–15)
BUN: 12 mg/dL (ref 8–23)
CO2: 25 mmol/L (ref 22–32)
Calcium: 8.3 mg/dL — ABNORMAL LOW (ref 8.9–10.3)
Chloride: 100 mmol/L (ref 98–111)
Creatinine, Ser: 1.63 mg/dL — ABNORMAL HIGH (ref 0.61–1.24)
GFR, Estimated: 44 mL/min — ABNORMAL LOW (ref >60)
Glucose, Bld: 124 mg/dL — ABNORMAL HIGH (ref 70–99)
Potassium: 4.5 mmol/L (ref 3.5–5.1)
Sodium: 134 mmol/L — ABNORMAL LOW (ref 135–145)
Total Bilirubin: 2.1 mg/dL — ABNORMAL HIGH (ref 0.3–1.2)
Total Protein: 7.6 g/dL (ref 6.5–8.1)

## 2021-03-29 LAB — RESP PANEL BY RT-PCR (FLU A&B, COVID) ARPGX2
Influenza A by PCR: NEGATIVE
Influenza B by PCR: NEGATIVE
SARS Coronavirus 2 by RT PCR: NEGATIVE

## 2021-03-29 LAB — TROPONIN I (HIGH SENSITIVITY)
Troponin I (High Sensitivity): 14 ng/L (ref <18)
Troponin I (High Sensitivity): 18 ng/L — ABNORMAL HIGH (ref <18)

## 2021-03-29 LAB — LIPASE, BLOOD: Lipase: 77 U/L — ABNORMAL HIGH (ref 11–51)

## 2021-03-29 LAB — LACTIC ACID, PLASMA
Lactic Acid, Venous: 1.2 mmol/L (ref 0.5–1.9)
Lactic Acid, Venous: 1.4 mmol/L (ref 0.5–1.9)

## 2021-03-29 LAB — HIV ANTIBODY (ROUTINE TESTING W REFLEX): HIV Screen 4th Generation wRfx: NONREACTIVE

## 2021-03-29 MED ORDER — SODIUM CHLORIDE 0.9 % IV SOLN
500.0000 mg | INTRAVENOUS | Status: AC
  Administered 2021-03-29 – 2021-04-02 (×5): 500 mg via INTRAVENOUS
  Filled 2021-03-29 (×5): qty 500

## 2021-03-29 MED ORDER — GUAIFENESIN ER 600 MG PO TB12
1200.0000 mg | ORAL_TABLET | Freq: Two times a day (BID) | ORAL | Status: DC
  Administered 2021-03-29 – 2021-04-07 (×17): 1200 mg via ORAL
  Filled 2021-03-29 (×20): qty 2

## 2021-03-29 MED ORDER — SODIUM CHLORIDE 0.9 % IV SOLN
2.0000 g | Freq: Two times a day (BID) | INTRAVENOUS | Status: DC
  Administered 2021-03-30 – 2021-04-05 (×14): 2 g via INTRAVENOUS
  Filled 2021-03-29 (×15): qty 2

## 2021-03-29 MED ORDER — LORATADINE 10 MG PO TABS
10.0000 mg | ORAL_TABLET | Freq: Every day | ORAL | Status: DC
  Administered 2021-03-29 – 2021-04-07 (×9): 10 mg via ORAL
  Filled 2021-03-29 (×10): qty 1

## 2021-03-29 MED ORDER — SODIUM CHLORIDE 0.9 % IV SOLN: INTRAVENOUS | Status: AC

## 2021-03-29 MED ORDER — SODIUM CHLORIDE 0.9 % IV SOLN
2.0000 g | INTRAVENOUS | Status: DC
  Administered 2021-03-29: 2 g via INTRAVENOUS
  Filled 2021-03-29: qty 20

## 2021-03-29 MED ORDER — RISAQUAD PO CAPS
1.0000 | ORAL_CAPSULE | Freq: Three times a day (TID) | ORAL | Status: DC
  Administered 2021-03-29 – 2021-04-07 (×25): 1 via ORAL
  Filled 2021-03-29 (×29): qty 1

## 2021-03-29 MED ORDER — CARVEDILOL 3.125 MG PO TABS
3.1250 mg | ORAL_TABLET | Freq: Two times a day (BID) | ORAL | Status: DC
  Administered 2021-03-29 – 2021-04-08 (×18): 3.125 mg via ORAL
  Filled 2021-03-29 (×20): qty 1

## 2021-03-29 MED ORDER — CARISOPRODOL 350 MG PO TABS
350.0000 mg | ORAL_TABLET | Freq: Every evening | ORAL | Status: DC | PRN
  Administered 2021-03-31 – 2021-04-06 (×5): 350 mg via ORAL
  Filled 2021-03-29 (×6): qty 1

## 2021-03-29 MED ORDER — IPRATROPIUM BROMIDE 0.02 % IN SOLN
2.5000 mL | Freq: Three times a day (TID) | RESPIRATORY_TRACT | Status: DC
  Administered 2021-03-29 – 2021-03-31 (×5): 0.5 mg via RESPIRATORY_TRACT
  Filled 2021-03-29 (×5): qty 2.5

## 2021-03-29 MED ORDER — SIMVASTATIN 20 MG PO TABS
20.0000 mg | ORAL_TABLET | Freq: Every day | ORAL | Status: DC
  Administered 2021-03-29 – 2021-04-07 (×8): 20 mg via ORAL
  Filled 2021-03-29 (×9): qty 1

## 2021-03-29 MED ORDER — LEVALBUTEROL HCL 1.25 MG/0.5ML IN NEBU
1.2500 mg | INHALATION_SOLUTION | Freq: Three times a day (TID) | RESPIRATORY_TRACT | Status: DC
  Administered 2021-03-29 – 2021-03-31 (×5): 1.25 mg via RESPIRATORY_TRACT
  Filled 2021-03-29 (×5): qty 0.5

## 2021-03-29 MED ORDER — PREDNISONE 20 MG PO TABS
40.0000 mg | ORAL_TABLET | Freq: Every day | ORAL | Status: DC
  Administered 2021-03-31 – 2021-04-01 (×2): 40 mg via ORAL
  Filled 2021-03-29 (×3): qty 2

## 2021-03-29 MED ORDER — ALPRAZOLAM 0.5 MG PO TABS
0.5000 mg | ORAL_TABLET | Freq: Two times a day (BID) | ORAL | Status: DC | PRN
  Administered 2021-03-30 – 2021-04-01 (×5): 0.5 mg via ORAL
  Filled 2021-03-29 (×5): qty 1

## 2021-03-29 MED ORDER — LACTATED RINGERS IV BOLUS (SEPSIS)
500.0000 mL | Freq: Once | INTRAVENOUS | Status: AC
  Administered 2021-03-29: 500 mL via INTRAVENOUS

## 2021-03-29 MED ORDER — SODIUM CHLORIDE 0.9 % IV SOLN
2.0000 g | Freq: Once | INTRAVENOUS | Status: AC
  Administered 2021-03-29: 2 g via INTRAVENOUS
  Filled 2021-03-29: qty 2

## 2021-03-29 MED ORDER — IPRATROPIUM-ALBUTEROL 0.5-2.5 (3) MG/3ML IN SOLN
3.0000 mL | Freq: Once | RESPIRATORY_TRACT | Status: AC
  Administered 2021-03-29: 3 mL via RESPIRATORY_TRACT

## 2021-03-29 MED ORDER — FLUOXETINE HCL 10 MG PO CAPS
20.0000 mg | ORAL_CAPSULE | Freq: Every day | ORAL | Status: DC
  Administered 2021-03-29 – 2021-04-07 (×9): 20 mg via ORAL
  Filled 2021-03-29 (×10): qty 1

## 2021-03-29 MED ORDER — HEPARIN SODIUM (PORCINE) 5000 UNIT/ML IJ SOLN
5000.0000 [IU] | Freq: Two times a day (BID) | INTRAMUSCULAR | Status: DC
  Administered 2021-03-29 – 2021-04-07 (×20): 5000 [IU] via SUBCUTANEOUS
  Filled 2021-03-29 (×19): qty 1

## 2021-03-29 NOTE — Progress Notes (Signed)
   Subjective:    Patient ID: Kristopher [name], male    DOB: 26-Oct-1945, 75 y.o.   MRN: [id number]  HPI I have never seen this patient before.  He was unable to provide any history for me due to respiratory distress.  His daughter presented to our office urgently running and yelling call 911.  She stated that her father was in the car unable to breathe.  His oxygen saturations were in the 70s on room air.  On 6 L with a nonrebreather mask, his oxygen level came up to the low 90s but he was in obvious respiratory distress with tachypnea, frequent coughing, and increased accessory muscle use.  He had diminished breath sounds in all 4 lung fields and faint expiratory wheezing.  He had very poor air movement and appear to be in a COPD exacerbation.  His daughter states that he has been running a fever for the last 48 hours and in fact his temperature here was over 101.  She also states that he had been throwing up blood earlier in the week.  We immediately called 911.  Patient was given 2.5 mg of albuterol in addition to 0.5 mg of Atrovent urgently and was placed on 6 L of oxygen via nonrebreather mask.  EMS arrived rapidly there after he was transported directly to the hospital in respiratory distress.   Review of Systems     Objective:   Physical Exam Constitutional:      Appearance: He is ill-appearing and toxic-appearing.  Cardiovascular:     Rate and Rhythm: Tachycardia present.     Heart sounds: Normal heart sounds.  Pulmonary:     Effort: Tachypnea and respiratory distress present.     Breath sounds: Wheezing present. No rhonchi or rales.          Assessment & Plan:  Acute respiratory failure with hypoxia (HCC) - Plan: ipratropium-albuterol (DUONEB) 0.5-2.5 (3) MG/3ML nebulizer solution 3 mL  Respiratory distress Patient is unknown to me however I suspect COPD exacerbation with likely pneumonia versus COVID-19.  Patient requires emergent transport to the hospital due to respiratory  distress.  He did not improve after receiving 1 neb in the office but thankfully EMS was already on scene and transported emergently to the hospital.

## 2021-03-29 NOTE — ED Triage Notes (Signed)
Patient BIB [facility] from urgent care for respiratory distress. Patient reports fevers, nausea, vomiting over the last several days and states he has not fully recovered since having COVID-19 in March 2022. Patient is alert and oriented. Arrives on CPAP from EMS, placed on NRB at [facility]. SpO2 99% on NRB. Patient complains of abdominal pain.

## 2021-03-29 NOTE — ED Provider Notes (Signed)
[facility] EMERGENCY DEPARTMENT Provider Note   CSN: [id number] Arrival date & time: 03/29/21  1033     History Chief Complaint  Patient presents with   Respiratory Distress    Kristopher [name] is a 75 y.o. male.  Patient is a 75 year old male who presents with respiratory distress.  He has a history of cardiomyopathy, hypertension and hyperlipidemia.  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  He states he has had a 2 to 3-day history of cough, worsening shortness of breath and fevers.  He started having nausea vomiting and diarrhea yesterday.  He is continue to have some vomiting this morning.  He went to [facility] family practice this morning and was noted to be febrile with a temperature of 101.4.  He was hypoxic with an oxygen saturation of 76%.  He was placed on nonrebreather and given duo nebs at that facility.  When EMS arrived, they placed him on CPAP.  His respiratory status has improved with that.  He has had a cough has been productive of green sputum.  He also has complained of some chest pain to his left chest.  He says its a little bit mild now.      Past Medical History:  Diagnosis Date   Anxiety    takes Xanax prn   Cardiomyopathy    takes Digoxin daily   Depression    takes Prozac daily   Dyslipidemia    takes Simvastatin daily   HTN (hypertension)    takes Carvedilol and Lisinopril daily   Impaired hearing    left    Patient Active Problem List   Diagnosis Date Noted   Community acquired pneumonia 03/29/2021   Respiratory distress    LFT elevation    COVID-19 virus infection 05/25/2019   AKI (acute kidney injury) ([place])    Pneumonia 08/20/2012   Nausea & vomiting 08/20/2012   Grief at loss of child 08/20/2012   Healthcare maintenance 11/29/2011   Anorexia 04/17/2011   TOBACCO ABUSE 11/23/2009   Dyslipidemia 05/01/2009   Essential hypertension 05/01/2009   CARDIOMYOPATHY 05/01/2009     Past Surgical History:  Procedure Laterality Date   CHOLECYSTECTOMY     > 18yr ago   COLONOSCOPY     HERNIA REPAIR     kidney removed     but unsure of which one;>123yrago   MASS EXCISION Left 06/14/2013   Procedure: EXCISION of left chest wall MASS;  Surgeon: BrMadilyn HookDO;  Location: [facility] OR;  Service: General;  Laterality: Left;   SPLENECTOMY     >1050yrgo       Family History  Problem Relation Age of Onset   Cancer Mother    Heart attack Sister    Heart attack Brother    Stroke Neg Hx     Social History   Tobacco Use   Smoking status: Some Days    Packs/day: 1.00    Years: 50.00    Pack years: 50.00    Types: Cigarettes   Smokeless tobacco: Never   Tobacco comments:    positive for tobacco abuse   Vaping Use   Vaping Use: Never used  Substance Use Topics   Alcohol use: No   Drug use: No    Home Medications Prior to Admission medications   Medication Sig Start Date End Date Taking? Authorizing Provider  ALPRAZolam (XADuanne Moron.5 MG tablet Take 1 tablet (0.5 mg total)  by mouth 2 (two) times daily as needed for anxiety. 11/20/16  Yes [name], MD  carisoprodol (SOMA) 350 MG tablet Take 350 mg by mouth at bedtime as needed for muscle spasms.   Yes [provider]  carvedilol (COREG) 3.125 MG tablet Take 1 tablet (3.125 mg total) by mouth 2 (two) times daily with a meal. 06/03/19  Yes Allie Bossier, MD  cetirizine (ZYRTEC) 10 MG tablet Take 10 mg by mouth daily.   Yes [provider]  FLUoxetine (PROZAC) 20 MG capsule Take 1 capsule (20 mg total) by mouth daily. Please request next refill from PCP. Thank you 02/12/18  Yes [name], MD  Furosemide (LASIX PO) Take 1 tablet by mouth daily.   Yes [provider]  guaiFENesin (MUCINEX) 600 MG 12 hr tablet Take 2 tablets (1,200 mg total) by mouth 2 (two) times daily. 06/03/19  Yes Allie Bossier, MD  ipratropium (ATROVENT HFA) 17 MCG/ACT inhaler Inhale 2 puffs into the lungs every 8  (eight) hours. 06/03/19  Yes Allie Bossier, MD  levalbuterol [facility] HFA) 45 MCG/ACT inhaler Inhale 2 puffs into the lungs every 8 (eight) hours. 06/03/19  Yes Allie Bossier, MD  potassium chloride SA (K-DUR) 20 MEQ tablet TAKE 1 TABLET BY MOUTH ONCE A DAY Patient taking differently: Take 10 mEq by mouth daily. 04/12/19  Yes [name], MD  simvastatin (ZOCOR) 20 MG tablet Take 1 tablet (20 mg total) by mouth at bedtime. Please schedule overdue follow up for further refills [id number] 1st attempt Patient taking differently: Take 20 mg by mouth at bedtime. 02/21/20  Yes [name], MD  amLODipine (NORVASC) 10 MG tablet Take 1 tablet (10 mg total) by mouth daily. Patient not taking: No sig reported 06/04/19   Allie Bossier, MD  cholecalciferol (VITAMIN D) 25 MCG tablet Take 1 tablet (1,000 Units total) by mouth daily. Patient not taking: No sig reported 06/04/19   Allie Bossier, MD  nicotine (NICODERM CQ - DOSED IN MG/24 HR) 7 mg/24hr patch Place 1 patch (7 mg total) onto the skin at bedtime. Patient not taking: No sig reported 06/03/19   Allie Bossier, MD  nicotine polacrilex (NICORETTE) 2 MG gum Take 1 each (2 mg total) by mouth as needed for smoking cessation. Patient not taking: No sig reported 06/03/19   Allie Bossier, MD  ondansetron (ZOFRAN) 8 MG tablet Take 8 mg by mouth every 8 (eight) hours as needed for nausea or vomiting. Patient not taking: No sig reported    [provider]  tamsulosin (FLOMAX) 0.4 MG CAPS capsule Take 1 capsule (0.4 mg total) by mouth daily after supper. Patient not taking: No sig reported 06/03/19   Allie Bossier, MD  vitamin C (VITAMIN C) 500 MG tablet Take 1 tablet (500 mg total) by mouth daily. Patient not taking: No sig reported 06/03/19   Allie Bossier, MD  zinc sulfate 220 (50 Zn) MG capsule Take 1 capsule (220 mg total) by mouth daily. Patient not taking: No sig reported 06/03/19   Allie Bossier, MD    Allergies    Patient has no  known allergies.  Review of Systems   Review of Systems  Constitutional:  Positive for appetite change, chills, fatigue and fever. Negative for diaphoresis.  HENT:  Negative for congestion, rhinorrhea and sneezing.   Eyes: Negative.   Respiratory:  Positive for cough and shortness of breath. Negative for chest tightness.  Cardiovascular:  Positive for chest pain. Negative for leg swelling.  Gastrointestinal:  Positive for diarrhea, nausea and vomiting. Negative for abdominal pain and blood in stool.  Genitourinary:  Negative for difficulty urinating, flank pain, frequency and hematuria.  Musculoskeletal:  Negative for arthralgias and back pain.  Skin:  Negative for rash.  Neurological:  Negative for dizziness, speech difficulty, weakness, numbness and headaches.   Physical Exam Updated Vital Signs BP 130/86   Pulse 86   Temp 98.2 F (36.8 C) (Oral)   Resp (!) 33   SpO2 94%   Physical Exam Constitutional:      Appearance: He is well-developed.  HENT:     Head: Normocephalic and atraumatic.  Eyes:     Pupils: Pupils are equal, round, and reactive to light.  Cardiovascular:     Rate and Rhythm: Normal rate and regular rhythm.     Heart sounds: Normal heart sounds.  Pulmonary:     Effort: Pulmonary effort is normal. No respiratory distress.     Breath sounds: Normal breath sounds. No wheezing or rales.  Chest:     Chest wall: No tenderness.  Abdominal:     General: Bowel sounds are normal.     Palpations: Abdomen is soft.     Tenderness: There is abdominal tenderness. There is no guarding or rebound.     Comments: Large midline abdominal hernia, mild tenderness in epigastrium  Musculoskeletal:        General: Normal range of motion.     Cervical back: Normal range of motion and neck supple.     Comments: No edema or calf tenderness  Lymphadenopathy:     Cervical: No cervical adenopathy.  Skin:    General: Skin is warm and dry.     Findings: No rash.  Neurological:      Mental Status: He is alert and oriented to person, place, and time.    ED Results / Procedures / Treatments   Labs (all labs ordered are listed, but only abnormal results are displayed) Labs Reviewed  COMPREHENSIVE METABOLIC PANEL - Abnormal; Notable for the following components:      Result Value   Sodium 134 (*)    Glucose, Bld 124 (*)    Creatinine, Ser 1.63 (*)    Calcium 8.3 (*)    Albumin 3.2 (*)    AST 76 (*)    Alkaline Phosphatase 138 (*)    Total Bilirubin 2.1 (*)    GFR, Estimated 44 (*)    All other components within normal limits  CBC WITH DIFFERENTIAL/PLATELET - Abnormal; Notable for the following components:   WBC 16.9 (*)    RBC 5.87 (*)    Platelets 417 (*)    Neutro Abs 12.7 (*)    Monocytes Absolute 2.1 (*)    Abs Immature Granulocytes 0.10 (*)    All other components within normal limits  URINALYSIS, ROUTINE W REFLEX MICROSCOPIC - Abnormal; Notable for the following components:   Color, Urine AMBER (*)    Hgb urine dipstick SMALL (*)    Protein, ur 30 (*)    Leukocytes,Ua SMALL (*)    All other components within normal limits  TROPONIN I (HIGH SENSITIVITY) - Abnormal; Notable for the following components:   Troponin I (High Sensitivity) 18 (*)    All other components within normal limits  RESP PANEL BY RT-PCR (FLU A&B, COVID) ARPGX2  CULTURE, BLOOD (SINGLE)  URINE CULTURE  CULTURE, BLOOD (SINGLE)  EXPECTORATED SPUTUM ASSESSMENT W GRAM STAIN,  RFLX TO RESP C  LACTIC ACID, PLASMA  LACTIC ACID, PLASMA  LEGIONELLA PNEUMOPHILA SEROGP 1 UR AG  MYCOPLASMA PNEUMONIAE ANTIBODY, IGM  LIPASE, BLOOD  HIV ANTIBODY (ROUTINE TESTING W REFLEX)  STREP PNEUMONIAE URINARY ANTIGEN  TROPONIN I (HIGH SENSITIVITY)    EKG EKG Interpretation  Date/Time:  Friday March 29 2021 10:38:48 EDT Ventricular Rate:  98 PR Interval:  207 QRS Duration: 151 QT Interval:  369 QTC Calculation: 472 R Axis:   -52 Text Interpretation: Sinus rhythm Borderline prolonged PR  interval Left bundle branch block SINCE LAST TRACING HEART RATE HAS INCREASED Confirmed by Malvin Johns [id number]) on 03/29/2021 10:58:25 AM  Radiology DG Chest Port 1 View  Result Date: 03/29/2021 CLINICAL DATA:  Respiratory distress, fever, nausea and vomiting over the past few days. EXAM: PORTABLE CHEST 1 VIEW COMPARISON:  Single-view of the chest 06/01/2019. CT chest 04/12/2014. FINDINGS: The lungs are markedly emphysematous. There is left worse than right mid and lower lung zone airspace disease. No pneumothorax or pleural effusion. Aortic atherosclerosis. Heart size is mildly enlarged. IMPRESSION: Left worse than right airspace disease most worrisome for pneumonia. Aortic Atherosclerosis (ICD10-I70.0) and Emphysema (ICD10-J43.9). Electronically Signed   By: Inge Rise M.D.   On: 03/29/2021 11:46   US Abdomen Limited RUQ (LIVER/GB)  Result Date: 03/29/2021 CLINICAL DATA:  Elevated LFTs. EXAM: ULTRASOUND ABDOMEN LIMITED RIGHT UPPER QUADRANT COMPARISON:  CT 05/25/2019 FINDINGS: Gallbladder: Surgically absent. Common bile duct: Diameter: 15 mm proximally. This is similar to prior CT. No visualized choledocholithiasis. Liver: No focal lesion identified. Within normal limits in parenchymal echogenicity. Portal vein is patent on color Doppler imaging with normal direction of blood flow towards the liver. Other: No right upper quadrant ascites. IMPRESSION: 1. Post cholecystectomy. Dilated common bile duct measuring 15 mm at the porta hepatis. This was also seen on 2020 CT, and is likely related to prior cholecystectomy. However, in the setting of elevated LFTs, recommend further assessment with MRCP if patient is able to tolerate breath hold technique. There is no visualized choledocholithiasis. 2. Unremarkable sonographic appearance of the liver. Electronically Signed   By: Keith Rake M.D.   On: 03/29/2021 16:00    Procedures Procedures   Medications Ordered in ED Medications  azithromycin  (ZITHROMAX) 500 mg in sodium chloride 0.9 % 250 mL IVPB (0 mg Intravenous Stopped 03/29/21 1250)  acidophilus (RISAQUAD) capsule 1 capsule (has no administration in time range)  carvedilol (COREG) tablet 3.125 mg (has no administration in time range)  simvastatin (ZOCOR) tablet 20 mg (has no administration in time range)  ALPRAZolam (XANAX) tablet 0.5 mg (has no administration in time range)  FLUoxetine (PROZAC) capsule 20 mg (has no administration in time range)  carisoprodol (SOMA) tablet 350 mg (has no administration in time range)  loratadine (CLARITIN) tablet 10 mg (has no administration in time range)  guaiFENesin (MUCINEX) 12 hr tablet 1,200 mg (has no administration in time range)  ipratropium (ATROVENT) nebulizer solution 0.5 mg (0.5 mg Inhalation Not Given 03/29/21 1541)  levalbuterol (XOPENEX) nebulizer solution 1.25 mg (1.25 mg Inhalation Not Given 03/29/21 1541)  heparin injection 5,000 Units (has no administration in time range)  predniSONE (DELTASONE) tablet 40 mg (has no administration in time range)  0.9 %  sodium chloride infusion (has no administration in time range)  ceFEPIme (MAXIPIME) 2 g in sodium chloride 0.9 % 100 mL IVPB (has no administration in time range)  lactated ringers bolus 500 mL (0 mLs Intravenous Stopped 03/29/21 1245)  ceFEPIme (MAXIPIME) 2 g  in sodium chloride 0.9 % 100 mL IVPB (0 g Intravenous Stopped 03/29/21 1346)    ED Course  I have reviewed the triage vital signs and the nursing notes.  Pertinent labs & imaging results that were available during my care of the patient were reviewed by me and considered in my medical decision making (see chart for details).    MDM Rules/Calculators/A&P                          Patient is a 75 year old male who presents with respiratory distress.  He was on CPAP initially.  We did transition this to a nonrebreather and ultimately Venturi mask.  At this point he is maintaining oxygen saturations in the low 90s on a  Venturi mask.  His chest x-ray shows bilateral pneumonia.  He was started on broad-spectrum antibiotics for possible sepsis.  He was also given IV fluids although cautiously.  He was covered with Pseudomonas antibiotics given his history of splenectomy.  His COVID is negative.  His lactate is normal.  I spoke with Dr. Roosevelt Locks who will admit the patient for further treatment.  CRITICAL CARE Performed by: Malvin Johns Total critical care time: 60 minutes Critical care time was exclusive of separately billable procedures and treating other patients. Critical care was necessary to treat or prevent imminent or life-threatening deterioration. Critical care was time spent personally by me on the following activities: development of treatment plan with patient and/or surrogate as well as nursing, discussions with consultants, evaluation of patient's response to treatment, examination of patient, obtaining history from patient or surrogate, ordering and performing treatments and interventions, ordering and review of laboratory studies, ordering and review of radiographic studies, pulse oximetry and re-evaluation of patient's condition.  Final Clinical Impression(s) / ED Diagnoses Final diagnoses:  Respiratory distress  Community acquired pneumonia, unspecified laterality    Rx / DC Orders ED Discharge Orders     None        Malvin Johns, MD 03/29/21 [phone]

## 2021-03-29 NOTE — Progress Notes (Signed)
New admission to room 2W05. Patient is A&O*4. Able to verbalize his needs to staff. No CO pain or discomfort. Tele monitor applied and CCMD called. Skin assessed. Patient is on 15 L oxygen with venturi mask with sats of 94%. VS checked and within normal limit. Bed kept in low position and locked. Call bell in reach. Will continue to monitor.

## 2021-03-29 NOTE — Progress Notes (Signed)
[facility] folllowing for sepsis protocol

## 2021-03-29 NOTE — Patient Instructions (Signed)
EMS transported to hospital

## 2021-03-29 NOTE — ED Notes (Signed)
Nurse report given to [place], [place]

## 2021-03-29 NOTE — Plan of Care (Signed)

## 2021-03-29 NOTE — ED Notes (Signed)
Floor nurse not available to take report at this time.

## 2021-03-29 NOTE — Progress Notes (Addendum)
Pharmacy Antibiotic Note  Kristopher [name] is a 75 y.o. male admitted on 03/29/2021 with HCAP.  Pharmacy has been consulted for Cefepime dosing.  Plan: Cefepime 2g IV q12h --Monitor renal function, clinical status, and antibiotic plan  Temp (24hrs), Avg:99.3 F (37.4 C), Min:98.2 F (36.8 C), Max:101.4 F (38.6 C)  Recent Labs  Lab 03/29/21 1100 03/29/21 1217  WBC 16.9*  --   CREATININE 1.63*  --   LATICACIDVEN 1.2 1.4    CrCl cannot be calculated (Unknown ideal weight.).    No Known Allergies  Antimicrobials this admission: Cefepime 6/17 >>  Azith 6/17 >>  Dose adjustments this admission: N/A  Microbiology results: 6/17 BCx: pending 6/17 UCx: pending  6/17 Sputum: pending   Thank you for allowing pharmacy to be a part of this patient's care.  Joetta Manners, PharmD, [facility] Clinical Pharmacist  Please check AMION for all [place] phone numbers After 10:00 PM, call [place] [phone]

## 2021-03-29 NOTE — ED Notes (Signed)
Kristopher [name] daughter [phone] would like an update
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
## 2021-03-30 ENCOUNTER — Inpatient Hospital Stay (HOSPITAL_COMMUNITY): Payer: Medicare Other

## 2021-03-30 DIAGNOSIS — J9621 Acute and chronic respiratory failure with hypoxia: Secondary | ICD-10-CM | POA: Diagnosis present

## 2021-03-30 DIAGNOSIS — R0603 Acute respiratory distress: Secondary | ICD-10-CM | POA: Diagnosis not present

## 2021-03-30 DIAGNOSIS — I5030 Unspecified diastolic (congestive) heart failure: Secondary | ICD-10-CM | POA: Diagnosis not present

## 2021-03-30 DIAGNOSIS — J9601 Acute respiratory failure with hypoxia: Secondary | ICD-10-CM

## 2021-03-30 DIAGNOSIS — F411 Generalized anxiety disorder: Secondary | ICD-10-CM | POA: Diagnosis present

## 2021-03-30 DIAGNOSIS — A419 Sepsis, unspecified organism: Secondary | ICD-10-CM | POA: Diagnosis present

## 2021-03-30 LAB — COMPREHENSIVE METABOLIC PANEL
ALT: 32 U/L (ref 0–44)
AST: 38 U/L (ref 15–41)
Albumin: 2.4 g/dL — ABNORMAL LOW (ref 3.5–5.0)
Alkaline Phosphatase: 111 U/L (ref 38–126)
Anion gap: 7 (ref 5–15)
BUN: 12 mg/dL (ref 8–23)
CO2: 24 mmol/L (ref 22–32)
Calcium: 7.9 mg/dL — ABNORMAL LOW (ref 8.9–10.3)
Chloride: 104 mmol/L (ref 98–111)
Creatinine, Ser: 1.44 mg/dL — ABNORMAL HIGH (ref 0.61–1.24)
GFR, Estimated: 51 mL/min — ABNORMAL LOW (ref >60)
Glucose, Bld: 130 mg/dL — ABNORMAL HIGH (ref 70–99)
Potassium: 3.5 mmol/L (ref 3.5–5.1)
Sodium: 135 mmol/L (ref 135–145)
Total Bilirubin: 1.4 mg/dL — ABNORMAL HIGH (ref 0.3–1.2)
Total Protein: 6.1 g/dL — ABNORMAL LOW (ref 6.5–8.1)

## 2021-03-30 LAB — ECHOCARDIOGRAM COMPLETE
AR max vel: 2.48 cm2
AV Area VTI: 2.46 cm2
AV Area mean vel: 2.5 cm2
AV Mean grad: 5 mmHg
AV Peak grad: 9.2 mmHg
Ao pk vel: 1.52 m/s
Area-P 1/2: 3.99 cm2
S' Lateral: 2.9 cm

## 2021-03-30 LAB — STREP PNEUMONIAE URINARY ANTIGEN: Strep Pneumo Urinary Antigen: NEGATIVE
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
## 2021-03-30 NOTE — Progress Notes (Signed)
PROGRESS NOTE    Kristopher [name]  [id number] DOB: Jul 23, 1946 DOA: 03/29/2021 PCP: Susy Frizzle, MD     Brief Narrative:  Patient is a 75 year old male with a past medical history significant for systolic CHF, chronic cough, hypertension, and dyslipidemia.  He presented on 6/17 for malaise, decreased appetite, and worsening cough.  In the emergency department, he was started on CPAP and weaned down to [place].  Chest x-ray showed bilateral infiltrates suspicious for pneumonia.  White blood cell count 16.9.  Creatinine 1.63 on admission, in 8/20 it was 0.94.  New events last 24 hours / Subjective: Patient reports he is breathing better when he has supplemental oxygen.  He is quite hungry though it was recommended he be n.p.o. pending further speech evaluation.  He has a slight cough but no wheezing.  Assessment & Plan:   Principal Problem:   Acute respiratory failure ([place])  Patient on a nonrebreather, wean as able  Appreciate respiratory  Continue Mucinex 1200 mg bid  Prednisone 40 mg daily  Xopenex every 8 hours as needed  Atrovent nebulization every 8 hours as needed   Active Problems:   Community acquired pneumonia  Cefepime daily  Azithromycin 500 mg daily    Dyslipidemia  Continue Zocor 20 mg daily    Essential hypertension  Coreg 3.125 mg twice daily    Sepsis ([facility])  Normal saline 100 mL/hour  Blood cultures pending  Monitor CBC    GAD  Continue Prozac 20 mg daily  Xanax 0.5 mg twice daily as needed for anxiety    Acute kidney injury  Cont fluids  Avoid nephrotoxins when able    Dysphagia  NPO but will cont above meds when cleared by SLP   DVT prophylaxis: Heparin Code Status: Full Family Communication: Self Coming From: Home Disposition Plan: Home Barriers to Discharge: Requiring supplemental oxygen  Consultants:  None  Procedures:  None  Antimicrobials:  Anti-infectives (From admission, onward)    Start     Dose/Rate Route  Frequency Ordered Stop   03/29/21 2300  ceFEPIme (MAXIPIME) 2 g in sodium chloride 0.9 % 100 mL IVPB        2 g 200 mL/hr over 30 Minutes Intravenous Every 12 hours 03/29/21 1500     03/29/21 1300  ceFEPIme (MAXIPIME) 2 g in sodium chloride 0.9 % 100 mL IVPB        2 g 200 mL/hr over 30 Minutes Intravenous  Once 03/29/21 1249 03/29/21 1346   03/29/21 1115  cefTRIAXone (ROCEPHIN) 2 g in sodium chloride 0.9 % 100 mL IVPB  Status:  Discontinued        2 g 200 mL/hr over 30 Minutes Intravenous Every 24 hours 03/29/21 1102 03/29/21 1450   03/29/21 1115  azithromycin (ZITHROMAX) 500 mg in sodium chloride 0.9 % 250 mL IVPB        500 mg 250 mL/hr over 60 Minutes Intravenous Every 24 hours 03/29/21 1102          Objective: Vitals:   03/30/21 0512 03/30/21 0532 03/30/21 1357 03/30/21 1405  BP:  115/64 114/63   Pulse:  88 84   Resp:   20   Temp:  99.8 F (37.7 C) 99.5 F (37.5 C)   TempSrc:  Oral    SpO2: 96% 96% 95% 93%    Intake/Output Summary (Last 24 hours) at 03/30/2021 1446 Last data filed at 03/30/2021 0100 Gross per 24 hour  Intake 150 ml  Output 250 ml  Net -100  ml   Examination:  General exam: Appears calm and comfortable  Respiratory system: Clear to auscultation. Respiratory effort normal. No respiratory distress. No conversational dyspnea.  Cardiovascular system: S1 & S2 heard, RRR. No pedal edema. Gastrointestinal system: Abdomen is nondistended, soft and nontender. Normal bowel sounds heard. Central nervous system: Alert and oriented. No focal neurological deficits. Speech clear.  Extremities: Symmetric in appearance  Skin: No rashes, lesions or ulcers on exposed skin  Psychiatry: Judgement and insight appear normal. Mood & affect appropriate.   Data Reviewed: I have personally reviewed following labs and imaging studies  CBC: Recent Labs  Lab 03/29/21 1100  WBC 16.9*  NEUTROABS 12.7*  HGB 16.9  HCT 51.5  MCV 87.7  PLT [id number]*   Basic Metabolic  Panel: Recent Labs  Lab 03/29/21 1100 03/30/21 0454  NA 134* 135  K 4.5 3.5  CL 100 104  CO2 25 24  GLUCOSE 124* 130*  BUN 12 12  CREATININE 1.63* 1.44*  CALCIUM 8.3* 7.9*    Liver Function Tests: Recent Labs  Lab 03/29/21 1100 03/30/21 0454  AST 76* 38  ALT 44 32  ALKPHOS 138* 111  BILITOT 2.1* 1.4*  PROT 7.6 6.1*  ALBUMIN 3.2* 2.4*   Recent Labs  Lab 03/29/21 1754  LIPASE 77*    Sepsis Labs: Recent Labs  Lab 03/29/21 1100 03/29/21 1217  LATICACIDVEN 1.2 1.4    Recent Results (from the past 240 hour(s))  Resp Panel by RT-PCR (Flu A&B, Covid) Nasopharyngeal Swab     Status: None   Collection Time: 03/29/21 10:38 AM   Specimen: Nasopharyngeal Swab; Nasopharyngeal(NP) swabs in vial transport medium  Result Value Ref Range Status   SARS Coronavirus 2 by RT PCR NEGATIVE NEGATIVE Final    Comment: (NOTE) SARS-CoV-2 target nucleic acids are NOT DETECTED.  The SARS-CoV-2 RNA is generally detectable in upper respiratory specimens during the acute phase of infection. The lowest concentration of SARS-CoV-2 viral copies this assay can detect is 138 copies/mL. A negative result does not preclude SARS-Cov-2 infection and should not be used as the sole basis for treatment or other patient management decisions. A negative result may occur with  improper specimen collection/handling, submission of specimen other than nasopharyngeal swab, presence of viral mutation(s) within the areas targeted by this assay, and inadequate number of viral copies(<138 copies/mL). A negative result must be combined with clinical observations, patient history, and epidemiological information. The expected result is Negative.  Fact Sheet for Patients:  EntrepreneurPulse.com.au  Fact Sheet for Healthcare Providers:  IncredibleEmployment.be  This test is no t yet approved or cleared by the [place] FDA and  has been authorized for detection and/or  diagnosis of SARS-CoV-2 by FDA under an Emergency Use Authorization (EUA). This EUA will remain  in effect (meaning this test can be used) for the duration of the COVID-19 declaration under Section 564(b)(1) of the Act, 21 U.S.C.section 360bbb-3(b)(1), unless the authorization is terminated  or revoked sooner.       Influenza A by PCR NEGATIVE NEGATIVE Final   Influenza B by PCR NEGATIVE NEGATIVE Final    Comment: (NOTE) The Xpert Xpress SARS-CoV-2/FLU/RSV plus assay is intended as an aid in the diagnosis of influenza from Nasopharyngeal swab specimens and should not be used as a sole basis for treatment. Nasal washings and aspirates are unacceptable for Xpert Xpress SARS-CoV-2/FLU/RSV testing.  Fact Sheet for Patients: EntrepreneurPulse.com.au  Fact Sheet for Healthcare Providers: IncredibleEmployment.be  This test is not yet approved or cleared by  the [employer] and has been authorized for detection and/or diagnosis of SARS-CoV-2 by FDA under an Emergency Use Authorization (EUA). This EUA will remain in effect (meaning this test can be used) for the duration of the COVID-19 declaration under Section 564(b)(1) of the Act, 21 U.S.C. section 360bbb-3(b)(1), unless the authorization is terminated or revoked.  Performed at [facility], [place] [address], [place], [place] [address]   Urine culture     Status: None (Preliminary result)   Collection Time: 03/29/21 11:55 AM   Specimen: In/Out Cath Urine  Result Value Ref Range Status   Specimen Description IN/OUT CATH URINE  Final   Special Requests NONE  Final   Culture   Final    CULTURE REINCUBATED FOR BETTER GROWTH Performed at [facility], [place] [address] [place], [place] [address]    Report Status PENDING  Incomplete      Radiology Studies: CT CHEST WO CONTRAST  Result Date: 03/29/2021 CLINICAL DATA:  Hemoptysis, fevers, nausea, vomiting EXAM: CT CHEST WITHOUT  CONTRAST TECHNIQUE: Multidetector CT imaging of the chest was performed following the standard protocol without IV contrast. COMPARISON:  04/12/2014 FINDINGS: Cardiovascular: Aortic atherosclerosis. Normal heart size. Three-vessel coronary artery calcifications. No pericardial effusion. Mediastinum/Nodes: No enlarged mediastinal, hilar, or axillary lymph nodes. Saber sheath trachea. Thyroid gland and esophagus demonstrate no significant findings. Lungs/Pleura: Moderate centrilobular emphysema. Diffuse bilateral bronchial wall thickening. Trace bilateral pleural effusions and associated atelectasis or consolidation. Scattered heterogeneous airspace opacity throughout the lingula and bilateral lung bases with bronchial plugging in the dependent lung bases, particularly the left lung base. No pleural effusion or pneumothorax. Upper Abdomen: No acute abnormality. 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 Musculoskeletal: No chest wall mass or suspicious bone lesions identified. Exaggerated thoracic kyphosis. Ankylosis of the cervical and thoracic spine. IMPRESSION: 1. Scattered heterogeneous airspace opacity throughout the lingula and bilateral lung bases with bronchial plugging in the dependent lung bases, particularly the left lung base. Findings are consistent with multifocal infection and/or aspiration. 2. Diffuse bilateral bronchial wall thickening, consistent with nonspecific infectious or inflammatory bronchitis. 3. Trace bilateral pleural effusions and associated atelectasis or consolidation. 4. Emphysema. 5. Coronary artery disease. 6. 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 Aortic Atherosclerosis (ICD10-I70.0) and Emphysema (ICD10-J43.9). Electronically Signed   By: Eddie Candle M.D.   On: 03/29/2021 16:37   DG Chest Port 1 View  Result Date: 03/29/2021 CLINICAL DATA:  Respiratory distress, fever, nausea and vomiting over the past few  days. EXAM: PORTABLE CHEST 1 VIEW COMPARISON:  Single-view of the chest 06/01/2019. CT chest 04/12/2014. FINDINGS: The lungs are markedly emphysematous. There is left worse than right mid and lower lung zone airspace disease. No pneumothorax or pleural effusion. Aortic atherosclerosis. Heart size is mildly enlarged. IMPRESSION: Left worse than right airspace disease most worrisome for pneumonia. Aortic Atherosclerosis (ICD10-I70.0) and Emphysema (ICD10-J43.9). Electronically Signed   By: Inge Rise M.D.   On: 03/29/2021 11:46   US Abdomen Limited RUQ (LIVER/GB)  Result Date: 03/29/2021 CLINICAL DATA:  Elevated LFTs. EXAM: ULTRASOUND ABDOMEN LIMITED RIGHT UPPER QUADRANT COMPARISON:  CT 05/25/2019 FINDINGS: Gallbladder: Surgically absent. Common bile duct: Diameter: 15 mm proximally. This is similar to prior CT. No visualized choledocholithiasis. Liver: No focal lesion identified. Within normal limits in parenchymal echogenicity. Portal vein is patent on color Doppler imaging with normal direction of blood flow towards the liver. Other: No right upper quadrant ascites. IMPRESSION: 1. Post cholecystectomy.  Dilated common bile duct measuring 15 mm at the porta hepatis. This was also seen on 2020 CT, and is likely related to prior cholecystectomy. However, in the setting of elevated LFTs, recommend further assessment with MRCP if patient is able to tolerate breath hold technique. There is no visualized choledocholithiasis. 2. Unremarkable sonographic appearance of the liver. Electronically Signed   By: Keith Rake M.D.   On: 03/29/2021 16:00     Scheduled Meds:  acidophilus  1 capsule Oral TID   carvedilol  3.125 mg Oral BID WC   FLUoxetine  20 mg Oral Daily   guaiFENesin  1,200 mg Oral BID   heparin  5,000 Units Subcutaneous Q12H   ipratropium  2.5 mL Inhalation Q8H   levalbuterol  1.25 mg Inhalation Q8H   loratadine  10 mg Oral Daily   predniSONE  40 mg Oral Q breakfast   simvastatin  20 mg  Oral QHS   Continuous Infusions:  sodium chloride 100 mL/hr at 03/30/21 1028   azithromycin 500 mg (03/30/21 1111)   ceFEPime (MAXIPIME) IV 2 g (03/30/21 1026)     LOS: 1 day    Time spent: 35 minutes   Shelda Pal, DO [facility] 03/30/2021, 2:46 PM   Available via Epic secure chat 7am-7pm After these hours, please refer to coverage provider listed on [email or web address]

## 2021-03-30 NOTE — Progress Notes (Signed)
Unable to administer PO morning meds as pt is, consistently coughing, having difficulty swallowing. Pt evaluated by speech, who  recommends NPO diet

## 2021-03-30 NOTE — Progress Notes (Signed)
Asked b y RT to eval patient as patient had to be placed on BIPAP.  Patient still appears tachypneic but Sats at 94% and patient reports he is feeling better  Bilateral crackles to base noted with diminished upper lobes.  Appears hot to touch but Primary RN reports temp is 99.36F  Advised to Primary RN and to Charge RN to notify if patient becomes more unstable and we can re evaluate.

## 2021-03-30 NOTE — Progress Notes (Signed)
Placed patient on bipap to help wit work of breathing and saturations. SP02=93-95% on 80% bipap and patient stated he felt like he was breathing easier.

## 2021-03-30 NOTE — Progress Notes (Addendum)
Patient was switched to HFNC at 15L towards start of shift from the venturi mask so that he could eat dinner tray. His saturation was maintained until 0340. He asked to be placed back on the venturi mask after eating ice cream and taking 1 medication. Patient stated he felt that he could breath better with the mask on. Attempt to transition patient back on venturi mask however saturation did not go above 86%. He began to have a coughing episode. Respiratory Therapist Sharrie Rothman was called to assess and to see if a PRN breathing treatment was available. At his request, he  was transitioned back to HFNC.   Encouraged patient to wear a non re-breather since he was going back to sleep and was give Xanax 0.'5mg'$ .    During this shift, writer has noticed coughing spells after medication was administered at 2130 and 0340. Patient saturation dropped to 86% during coughing spells while on HFNC at 15L.  Beverages were removed from bedside table. Will await further instruction by attending physician.

## 2021-03-30 NOTE — Progress Notes (Signed)
RN called due to patients SATs in mid 80's after turning the HFNC to 15 L. Placed patient on NRB at this time. SATs 93%. RR 28, HR 90. BBS clear. Will continue to monitor.

## 2021-03-31 ENCOUNTER — Inpatient Hospital Stay (HOSPITAL_COMMUNITY): Payer: Medicare Other

## 2021-03-31 DIAGNOSIS — R1310 Dysphagia, unspecified: Secondary | ICD-10-CM

## 2021-03-31 LAB — BASIC METABOLIC PANEL
Anion gap: 9 (ref 5–15)
BUN: 11 mg/dL (ref 8–23)
CO2: 22 mmol/L (ref 22–32)
Calcium: 8.1 mg/dL — ABNORMAL LOW (ref 8.9–10.3)
Chloride: 107 mmol/L (ref 98–111)
Creatinine, Ser: 1.3 mg/dL — ABNORMAL HIGH (ref 0.61–1.24)
GFR, Estimated: 57 mL/min — ABNORMAL LOW (ref >60)
Glucose, Bld: 93 mg/dL (ref 70–99)
Potassium: 3.7 mmol/L (ref 3.5–5.1)
Sodium: 138 mmol/L (ref 135–145)

## 2021-03-31 LAB — URINE CULTURE: Culture: 2000 — AB

## 2021-03-31 LAB — CBC
HCT: 44.9 % (ref 39.0–52.0)
Hemoglobin: 14.5 g/dL (ref 13.0–17.0)
MCH: 28.6 pg (ref 26.0–34.0)
MCHC: 32.3 g/dL (ref 30.0–36.0)
MCV: 88.6 fL (ref 80.0–100.0)
Platelets: 374 10*3/uL (ref 150–400)
RBC: 5.07 MIL/uL (ref 4.22–5.81)
RDW: 14.9 % (ref 11.5–15.5)
WBC: 13.7 10*3/uL — ABNORMAL HIGH (ref 4.0–10.5)
nRBC: 0 % (ref 0.0–0.2)

## 2021-03-31 MED ORDER — LACTATED RINGERS IV SOLN: INTRAVENOUS | Status: DC

## 2021-03-31 MED ORDER — LEVALBUTEROL HCL 1.25 MG/0.5ML IN NEBU
1.2500 mg | INHALATION_SOLUTION | RESPIRATORY_TRACT | Status: DC | PRN
  Administered 2021-04-01 – 2021-04-10 (×3): 1.25 mg via RESPIRATORY_TRACT
  Filled 2021-03-31 (×4): qty 0.5

## 2021-03-31 MED ORDER — IPRATROPIUM BROMIDE 0.02 % IN SOLN
2.5000 mL | RESPIRATORY_TRACT | Status: DC
  Administered 2021-03-31 – 2021-04-01 (×4): 0.5 mg via RESPIRATORY_TRACT
  Filled 2021-03-31 (×4): qty 2.5

## 2021-03-31 MED ORDER — LEVALBUTEROL HCL 1.25 MG/0.5ML IN NEBU
1.2500 mg | INHALATION_SOLUTION | RESPIRATORY_TRACT | Status: DC
  Administered 2021-03-31 – 2021-04-01 (×4): 1.25 mg via RESPIRATORY_TRACT
  Filled 2021-03-31 (×4): qty 0.5

## 2021-03-31 NOTE — Plan of Care (Signed)
  Problem: Nutrition: Goal: Adequate nutrition will be maintained Outcome: Progressing   Problem: Coping: Goal: Level of anxiety will decrease Outcome: Progressing   Problem: Pain Managment: Goal: General experience of comfort will improve Outcome: Progressing   Problem: Safety: Goal: Ability to remain free from injury will improve Outcome: Progressing   

## 2021-03-31 NOTE — Progress Notes (Signed)
SLP Cancellation Note  Patient Details Name: Kristopher [name] MRN: [id number] DOB: Sep 03, 1946   Cancelled treatment:       Reason Eval/Treat Not Completed: Medical issues which prohibited therapy. Patient had to be placed back on BiPap last night and is currently on NRB. SLP to f/u next date for readiness for PO trials.   Sonia Baller, MA, CCC-SLP Speech Therapy [facility]

## 2021-03-31 NOTE — Progress Notes (Signed)
RT NOTES: Removed patient from bipap and placed back on NRB mask. Sats 99%. Pt in no apparent distress. Will continue to monitor.

## 2021-03-31 NOTE — Progress Notes (Signed)
PROGRESS NOTE    Kristopher [name]  [id number] DOB: [birth year] DOA: 03/29/2021 PCP: Susy Frizzle, MD     Brief Narrative:  Patient is a 75 year old male with a past medical history significant for systolic CHF, chronic cough, hypertension, and dyslipidemia.  He presented on 6/17 for malaise, decreased appetite, and worsening cough.   In the emergency department, he was started on CPAP and weaned down to [place].  Chest x-ray showed bilateral infiltrates suspicious for pneumonia.  White blood cell count 16.9.  Creatinine 1.63 on admission, in 8/20 it was 0.94.   New events last 24 hours / Subjective: Yesterday evening, the patient was placed on NIPPV and respiratory rate improved.  He also reported feeling better.  He is currently on NIPPV and reports feeling good.  He is hungry and thirsty.  SLP made him n.p.o. yesterday.  No fever, mild cough, no wheezing.  Another chest x-ray was obtained that showed stable findings in the lungs.  Assessment & Plan:   Principal Problem:   Acute respiratory failure ([place])  He is back down to a nonrebreather, wean as able  Cefepime and azithromycin as before  Appreciate RT  Xopenex and Atrovent nebulizations every 4 hours, levalbuterol as needed  Continue prednisone 40 mg daily, day 2 of 5 today  Active Problems:   Dyslipidemia  Continue Zocor 20 mg daily    Essential hypertension  Continue Coreg 3.125 mg twice daily    Acute kidney injury ([place])  Improving, hold nephrotoxins when able, monitor BMP    Community acquired pneumonia, versus aspiration  As above    Sepsis ([place])  As he is currently n.p.o., I will start fluids, lactated Ringer's 100 mL/hour  Improving  Blood cultures are pending in addition to Legionella and mycoplasma    GAD (generalized anxiety disorder)  Continue Xanax and Prozac 20 mg daily  Dysphagia  Okay to hold above oral medicines as long as SLP does not clear him   DVT prophylaxis: Heparin Code Status:  Full Family Communication: Self Coming From: Home Disposition Plan: Home Barriers to Discharge: Oxygen requirements  Consultants:  None  Procedures:  None  Antimicrobials:  Anti-infectives (From admission, onward)    Start     Dose/Rate Route Frequency Ordered Stop   03/29/21 2300  ceFEPIme (MAXIPIME) 2 g in sodium chloride 0.9 % 100 mL IVPB        2 g 200 mL/hr over 30 Minutes Intravenous Every 12 hours 03/29/21 1500     03/29/21 1300  ceFEPIme (MAXIPIME) 2 g in sodium chloride 0.9 % 100 mL IVPB        2 g 200 mL/hr over 30 Minutes Intravenous  Once 03/29/21 1249 03/29/21 1346   03/29/21 1115  cefTRIAXone (ROCEPHIN) 2 g in sodium chloride 0.9 % 100 mL IVPB  Status:  Discontinued        2 g 200 mL/hr over 30 Minutes Intravenous Every 24 hours 03/29/21 1102 03/29/21 1450   03/29/21 1115  azithromycin (ZITHROMAX) 500 mg in sodium chloride 0.9 % 250 mL IVPB        500 mg 250 mL/hr over 60 Minutes Intravenous Every 24 hours 03/29/21 1102          Objective: Vitals:   03/30/21 2142 03/31/21 0352 03/31/21 0730 03/31/21 0947  BP:  (!) 141/62 (!) 118/57   Pulse: 83 89 83   Resp: (!) 33  (!) 36   Temp:  99.3 F (37.4 C)    TempSrc:  Axillary    SpO2: 93% 94% 95% 98%    Intake/Output Summary (Last 24 hours) at 03/31/2021 1050 Last data filed at 03/31/2021 0000 Gross per 24 hour  Intake --  Output 850 ml  Net -850 ml     Examination:  General exam: Appears calm and comfortable  Respiratory system: Rales noted in R lower lung field. No wheezing. No respiratory distress. No conversational dyspnea.  Cardiovascular system: S1 & S2 heard, RRR. No murmurs. No pedal edema. Gastrointestinal system: Abdomen is nondistended, soft and nontender. Normal bowel sounds heard. Central nervous system: Alert and oriented. No focal neurological deficits. Speech clear.  Psychiatry: Judgement and insight appear normal. Mood & affect appropriate.   Data Reviewed: I have personally reviewed  following labs and imaging studies  CBC: Recent Labs  Lab 03/29/21 1100 03/31/21 0157  WBC 16.9* 13.7*  NEUTROABS 12.7*  --   HGB 16.9 14.5  HCT 51.5 44.9  MCV 87.7 88.6  PLT 417* [id number]   Basic Metabolic Panel: Recent Labs  Lab 03/29/21 1100 03/30/21 0454 03/31/21 0157  NA 134* 135 138  K 4.5 3.5 3.7  CL 100 104 107  CO2 '25 24 22  '$ GLUCOSE 124* 130* 93  BUN '12 12 11  '$ CREATININE 1.63* 1.44* 1.30*  CALCIUM 8.3* 7.9* 8.1*   Liver Function Tests: Recent Labs  Lab 03/29/21 1100 03/30/21 0454  AST 76* 38  ALT 44 32  ALKPHOS 138* 111  BILITOT 2.1* 1.4*  PROT 7.6 6.1*  ALBUMIN 3.2* 2.4*   Recent Labs  Lab 03/29/21 1754  LIPASE 77*   Sepsis Labs: Recent Labs  Lab 03/29/21 1100 03/29/21 1217  LATICACIDVEN 1.2 1.4    Recent Results (from the past 240 hour(s))  Resp Panel by RT-PCR (Flu A&B, Covid) Nasopharyngeal Swab     Status: None   Collection Time: 03/29/21 10:38 AM   Specimen: Nasopharyngeal Swab; Nasopharyngeal(NP) swabs in vial transport medium  Result Value Ref Range Status   SARS Coronavirus 2 by RT PCR NEGATIVE NEGATIVE Final    Comment: (NOTE) SARS-CoV-2 target nucleic acids are NOT DETECTED.  The SARS-CoV-2 RNA is generally detectable in upper respiratory specimens during the acute phase of infection. The lowest concentration of SARS-CoV-2 viral copies this assay can detect is 138 copies/mL. A negative result does not preclude SARS-Cov-2 infection and should not be used as the sole basis for treatment or other patient management decisions. A negative result may occur with  improper specimen collection/handling, submission of specimen other than nasopharyngeal swab, presence of viral mutation(s) within the areas targeted by this assay, and inadequate number of viral copies(<138 copies/mL). A negative result must be combined with clinical observations, patient history, and epidemiological information. The expected result is Negative.  Fact Sheet  for Patients:  EntrepreneurPulse.com.au  Fact Sheet for Healthcare Providers:  IncredibleEmployment.be  This test is no t yet approved or cleared by the [place] FDA and  has been authorized for detection and/or diagnosis of SARS-CoV-2 by FDA under an Emergency Use Authorization (EUA). This EUA will remain  in effect (meaning this test can be used) for the duration of the COVID-19 declaration under Section 564(b)(1) of the Act, 21 U.S.C.section 360bbb-3(b)(1), unless the authorization is terminated  or revoked sooner.       Influenza A by PCR NEGATIVE NEGATIVE Final   Influenza B by PCR NEGATIVE NEGATIVE Final    Comment: (NOTE) The Xpert Xpress SARS-CoV-2/FLU/RSV plus assay is intended as an aid in the diagnosis of  influenza from Nasopharyngeal swab specimens and should not be used as a sole basis for treatment. Nasal washings and aspirates are unacceptable for Xpert Xpress SARS-CoV-2/FLU/RSV testing.  Fact Sheet for Patients: EntrepreneurPulse.com.au  Fact Sheet for Healthcare Providers: IncredibleEmployment.be  This test is not yet approved or cleared by the [place] FDA and has been authorized for detection and/or diagnosis of SARS-CoV-2 by FDA under an Emergency Use Authorization (EUA). This EUA will remain in effect (meaning this test can be used) for the duration of the COVID-19 declaration under Section 564(b)(1) of the Act, 21 U.S.C. section 360bbb-3(b)(1), unless the authorization is terminated or revoked.  Performed at [facility], [place] [address] [place], [place] [address]   Urine culture     Status: Abnormal (Preliminary result)   Collection Time: 03/29/21 11:55 AM   Specimen: In/Out Cath Urine  Result Value Ref Range Status   Specimen Description IN/OUT CATH URINE  Final   Special Requests NONE  Final   Culture (A)  Final    2,000 COLONIES/mL STAPHYLOCOCCUS  EPIDERMIDIS SUSCEPTIBILITIES TO FOLLOW Performed at [facility], [place] [address] [place], [place] [address]    Report Status PENDING  Incomplete      Radiology Studies: CT CHEST WO CONTRAST  Result Date: 03/29/2021 CLINICAL DATA:  Hemoptysis, fevers, nausea, vomiting EXAM: CT CHEST WITHOUT CONTRAST TECHNIQUE: Multidetector CT imaging of the chest was performed following the standard protocol without IV contrast. COMPARISON:  04/12/2014 FINDINGS: Cardiovascular: Aortic atherosclerosis. Normal heart size. Three-vessel coronary artery calcifications. No pericardial effusion. Mediastinum/Nodes: No enlarged mediastinal, hilar, or axillary lymph nodes. Saber sheath trachea. Thyroid gland and esophagus demonstrate no significant findings. Lungs/Pleura: Moderate centrilobular emphysema. Diffuse bilateral bronchial wall thickening. Trace bilateral pleural effusions and associated atelectasis or consolidation. Scattered heterogeneous airspace opacity throughout the lingula and bilateral lung bases with bronchial plugging in the dependent lung bases, particularly the left lung base. No pleural effusion or pneumothorax. Upper Abdomen: No acute abnormality. 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 Musculoskeletal: No chest wall mass or suspicious bone lesions identified. Exaggerated thoracic kyphosis. Ankylosis of the cervical and thoracic spine. IMPRESSION: 1. Scattered heterogeneous airspace opacity throughout the lingula and bilateral lung bases with bronchial plugging in the dependent lung bases, particularly the left lung base. Findings are consistent with multifocal infection and/or aspiration. 2. Diffuse bilateral bronchial wall thickening, consistent with nonspecific infectious or inflammatory bronchitis. 3. Trace bilateral pleural effusions and associated atelectasis or consolidation. 4. Emphysema. 5. Coronary artery disease. 6. 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 Aortic Atherosclerosis (ICD10-I70.0) and Emphysema (ICD10-J43.9). Electronically Signed   By: Eddie Candle M.D.   On: 03/29/2021 16:37   DG CHEST PORT 1 VIEW  Result Date: 03/31/2021 CLINICAL DATA:  Shortness of breath. EXAM: PORTABLE CHEST 1 VIEW COMPARISON:  Chest radiograph 03/29/2021 FINDINGS: Monitoring leads overlie the patient. Stable enlarged cardiac and mediastinal contours. Similar-appearing bilateral heterogeneous opacities. Small bilateral pleural effusions. No pneumothorax. IMPRESSION: Similar-appearing bilateral airspace opacities most compatible with multifocal infection. Small bilateral pleural effusions. Electronically Signed   By: Lovey Newcomer M.D.   On: 03/31/2021 09:18   DG Chest Port 1 View  Result Date: 03/29/2021 CLINICAL DATA:  Respiratory distress, fever, nausea and vomiting over the past few days. EXAM: PORTABLE CHEST 1 VIEW COMPARISON:  Single-view of the chest 06/01/2019. CT chest 04/12/2014. FINDINGS: The lungs are markedly emphysematous. There is left worse than right mid and lower lung zone airspace  disease. No pneumothorax or pleural effusion. Aortic atherosclerosis. Heart size is mildly enlarged. IMPRESSION: Left worse than right airspace disease most worrisome for pneumonia. Aortic Atherosclerosis (ICD10-I70.0) and Emphysema (ICD10-J43.9). Electronically Signed   By: Inge Rise M.D.   On: 03/29/2021 11:46   ECHOCARDIOGRAM COMPLETE  Result Date: 03/30/2021    ECHOCARDIOGRAM REPORT   Patient Name:   Kristopher [name] Date of Exam: 03/30/2021 Medical Rec #:  [id number]      Height:       67.0 in Accession #:    [id number]     Weight:       135.1 lb Date of Birth:  October 02, 1946      BSA:          1.712 m Patient Age:    15 years       BP:           115/64 mmHg Patient Gender: M              HR:           88 bpm. Exam Location:  Inpatient Procedure: 2D Echo, Color Doppler and Cardiac Doppler Indications:    CHF  History:        Patient has  prior history of Echocardiogram examinations, most                 recent 05/16/2013. Cardiomyopathy; Risk Factors:Dyslipidemia,                 Current Smoker and Hypertension.  Sonographer:    Cammy Brochure Referring Phys: [id number] [place]  1. Left ventricular ejection fraction, by estimation, is 60 to 65%. The left ventricle has normal function. The left ventricle has no regional wall motion abnormalities. Left ventricular diastolic parameters are consistent with Grade I diastolic dysfunction (impaired relaxation).  2. Right ventricular systolic function is low normal. The right ventricular size is normal. Tricuspid regurgitation signal is inadequate for assessing PA pressure.  3. Left atrial size was moderately dilated.  4. The mitral valve is grossly normal. Trivial mitral valve regurgitation.  5. The aortic valve is tricuspid. Aortic valve regurgitation is not visualized. No aortic stenosis is present. Aortic valve mean gradient measures 5.0 mmHg. Comparison(s): No prior Echocardiogram. FINDINGS  Left Ventricle: Left ventricular ejection fraction, by estimation, is 60 to 65%. The left ventricle has normal function. The left ventricle has no regional wall motion abnormalities. The left ventricular internal cavity size was normal in size. There is  no left ventricular hypertrophy. Abnormal (paradoxical) septal motion, consistent with left bundle branch block. Left ventricular diastolic parameters are consistent with Grade I diastolic dysfunction (impaired relaxation). Indeterminate filling pressures. Right Ventricle: The right ventricular size is normal. No increase in right ventricular wall thickness. Right ventricular systolic function is low normal. Tricuspid regurgitation signal is inadequate for assessing PA pressure. Left Atrium: Left atrial size was moderately dilated. Right Atrium: Right atrial size was normal in size. Pericardium: There is no evidence of pericardial effusion. Mitral  Valve: The mitral valve is grossly normal. Trivial mitral valve regurgitation. Tricuspid Valve: The tricuspid valve is grossly normal. Tricuspid valve regurgitation is trivial. Aortic Valve: The aortic valve is tricuspid. Aortic valve regurgitation is not visualized. No aortic stenosis is present. Aortic valve mean gradient measures 5.0 mmHg. Aortic valve peak gradient measures 9.2 mmHg. Aortic valve area, by VTI measures 2.46 cm. Pulmonic Valve: The pulmonic valve was normal in structure. Pulmonic valve regurgitation is not visualized. Aorta: The  aortic root and ascending aorta are structurally normal, with no evidence of dilitation. Venous: The inferior vena cava was not well visualized. IAS/Shunts: No atrial level shunt detected by color flow Doppler.  LEFT VENTRICLE PLAX 2D LVIDd:         4.20 cm  Diastology LVIDs:         2.90 cm  LV e' medial:    8.16 cm/s LV PW:         1.20 cm  LV E/e' medial:  13.5 LV IVS:        1.00 cm  LV e' lateral:   8.70 cm/s LVOT diam:     2.10 cm  LV E/e' lateral: 12.6 LV SV:         69 LV SV Index:   40 LVOT Area:     3.46 cm  RIGHT VENTRICLE RV Basal diam:  3.30 cm RV S prime:     9.57 cm/s LEFT ATRIUM             Index       RIGHT ATRIUM           Index LA diam:        3.70 cm 2.16 cm/m  RA Area:     12.60 cm LA Vol (A2C):   56.8 ml 33.18 ml/m RA Volume:   25.60 ml  14.95 ml/m LA Vol (A4C):   67.9 ml 39.66 ml/m LA Biplane Vol: 62.5 ml 36.51 ml/m  AORTIC VALVE AV Area (Vmax):    2.48 cm AV Area (Vmean):   2.50 cm AV Area (VTI):     2.46 cm AV Vmax:           152.00 cm/s AV Vmean:          102.000 cm/s AV VTI:            0.279 m AV Peak Grad:      9.2 mmHg AV Mean Grad:      5.0 mmHg LVOT Vmax:         109.00 cm/s LVOT Vmean:        73.700 cm/s LVOT VTI:          0.198 m LVOT/AV VTI ratio: 0.71  AORTA Ao Root diam: 3.20 cm Ao Asc diam:  3.10 cm MITRAL VALVE MV Area (PHT): 3.99 cm     SHUNTS MV Decel Time: 190 msec     Systemic VTI:  0.20 m MV E velocity: 110.00 cm/s   Systemic Diam: 2.10 cm MV A velocity: 93.40 cm/s MV E/A ratio:  1.18 Lyman Bishop MD Electronically signed by Lyman Bishop MD Signature Date/Time: 03/30/2021/2:58:18 PM    Final    US Abdomen Limited RUQ (LIVER/GB)  Result Date: 03/29/2021 CLINICAL DATA:  Elevated LFTs. EXAM: ULTRASOUND ABDOMEN LIMITED RIGHT UPPER QUADRANT COMPARISON:  CT 05/25/2019 FINDINGS: Gallbladder: Surgically absent. Common bile duct: Diameter: 15 mm proximally. This is similar to prior CT. No visualized choledocholithiasis. Liver: No focal lesion identified. Within normal limits in parenchymal echogenicity. Portal vein is patent on color Doppler imaging with normal direction of blood flow towards the liver. Other: No right upper quadrant ascites. IMPRESSION: 1. Post cholecystectomy. Dilated common bile duct measuring 15 mm at the porta hepatis. This was also seen on 2020 CT, and is likely related to prior cholecystectomy. However, in the setting of elevated LFTs, recommend further assessment with MRCP if patient is able to tolerate breath hold technique. There is no visualized choledocholithiasis. 2. Unremarkable sonographic appearance  of the liver. Electronically Signed   By: Keith Rake M.D.   On: 03/29/2021 16:00     Scheduled Meds:  acidophilus  1 capsule Oral TID   carvedilol  3.125 mg Oral BID WC   FLUoxetine  20 mg Oral Daily   guaiFENesin  1,200 mg Oral BID   heparin  5,000 Units Subcutaneous Q12H   ipratropium  2.5 mL Inhalation Q8H   levalbuterol  1.25 mg Inhalation Q8H   loratadine  10 mg Oral Daily   predniSONE  40 mg Oral Q breakfast   simvastatin  20 mg Oral QHS   Continuous Infusions:  azithromycin 500 mg (03/30/21 1111)   ceFEPime (MAXIPIME) IV 2 g (03/31/21 1036)     LOS: 2 days    Time spent: 30 minutes   Shelda Pal, DO [facility] 03/31/2021, 10:50 AM   Available via Epic secure chat 7am-7pm After these hours, please refer to coverage provider listed on  [email or web address]

## 2021-04-01 DIAGNOSIS — Z515 Encounter for palliative care: Secondary | ICD-10-CM

## 2021-04-01 DIAGNOSIS — Z7189 Other specified counseling: Secondary | ICD-10-CM

## 2021-04-01 DIAGNOSIS — L570 Actinic keratosis: Secondary | ICD-10-CM

## 2021-04-01 DIAGNOSIS — R1310 Dysphagia, unspecified: Secondary | ICD-10-CM

## 2021-04-01 DIAGNOSIS — E785 Hyperlipidemia, unspecified: Secondary | ICD-10-CM

## 2021-04-01 DIAGNOSIS — R6520 Severe sepsis without septic shock: Secondary | ICD-10-CM

## 2021-04-01 DIAGNOSIS — F411 Generalized anxiety disorder: Secondary | ICD-10-CM

## 2021-04-01 DIAGNOSIS — A419 Sepsis, unspecified organism: Principal | ICD-10-CM

## 2021-04-01 LAB — BASIC METABOLIC PANEL
Anion gap: 5 (ref 5–15)
BUN: 21 mg/dL (ref 8–23)
CO2: 22 mmol/L (ref 22–32)
Calcium: 8.4 mg/dL — ABNORMAL LOW (ref 8.9–10.3)
Chloride: 112 mmol/L — ABNORMAL HIGH (ref 98–111)
Creatinine, Ser: 1.27 mg/dL — ABNORMAL HIGH (ref 0.61–1.24)
GFR, Estimated: 59 mL/min — ABNORMAL LOW (ref >60)
Glucose, Bld: 177 mg/dL — ABNORMAL HIGH (ref 70–99)
Potassium: 3.8 mmol/L (ref 3.5–5.1)
Sodium: 139 mmol/L (ref 135–145)

## 2021-04-01 LAB — CBC
HCT: 44.9 % (ref 39.0–52.0)
Hemoglobin: 14.5 g/dL (ref 13.0–17.0)
MCH: 28.8 pg (ref 26.0–34.0)
MCHC: 32.3 g/dL (ref 30.0–36.0)
MCV: 89.1 fL (ref 80.0–100.0)
Platelets: 413 10*3/uL — ABNORMAL HIGH (ref 150–400)
RBC: 5.04 MIL/uL (ref 4.22–5.81)
RDW: 14.9 % (ref 11.5–15.5)
WBC: 9.8 10*3/uL (ref 4.0–10.5)
nRBC: 0 % (ref 0.0–0.2)

## 2021-04-01 LAB — LEGIONELLA PNEUMOPHILA SEROGP 1 UR AG: L. pneumophila Serogp 1 Ur Ag: NEGATIVE

## 2021-04-01 LAB — MYCOPLASMA PNEUMONIAE ANTIBODY, IGM: Mycoplasma pneumo IgM: 922 U/mL — ABNORMAL HIGH (ref 0–769)

## 2021-04-01 MED ORDER — IPRATROPIUM BROMIDE 0.02 % IN SOLN
2.5000 mL | Freq: Four times a day (QID) | RESPIRATORY_TRACT | Status: DC
  Administered 2021-04-01 (×4): 0.5 mg via RESPIRATORY_TRACT
  Filled 2021-04-01 (×4): qty 2.5

## 2021-04-01 MED ORDER — LEVALBUTEROL HCL 1.25 MG/0.5ML IN NEBU
1.2500 mg | INHALATION_SOLUTION | Freq: Four times a day (QID) | RESPIRATORY_TRACT | Status: DC
  Administered 2021-04-01 – 2021-04-02 (×5): 1.25 mg via RESPIRATORY_TRACT
  Filled 2021-04-01 (×5): qty 0.5

## 2021-04-01 MED ORDER — METHYLPREDNISOLONE SODIUM SUCC 125 MG IJ SOLR
60.0000 mg | Freq: Three times a day (TID) | INTRAMUSCULAR | Status: DC
  Administered 2021-04-01 – 2021-04-04 (×9): 60 mg via INTRAVENOUS
  Filled 2021-04-01 (×9): qty 2
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
## 2021-04-01 NOTE — Progress Notes (Signed)
Pharmacy Antibiotic Note  Kristopher [name] is a 75 y.o. male admitted on 03/29/2021 with  sepsis/PNA .  Pharmacy has been consulted for Cefepime dosing.  ID: CAP vs aspiration, Sepsis. WBC 9.8 down, LA 1.4, Scr 1.27 down slightly.   6/17 Cefepime>> 6/16 Azith >>   6/17 Ucx >> staph epi,2000 colonies, pan sens. 6/17 Bcx >> sent   Plan: Con't Cefepime 2g IV q12 hrs. Pharmacy will sign off. Please reconsult for further dosing assitance. Will follow along peripherally for improvement in renal function and dose adjustments.     Temp (24hrs), Avg:98.9 F (37.2 C), Min:98.9 F (37.2 C), Max:98.9 F (37.2 C)  Recent Labs  Lab 03/29/21 1100 03/29/21 1217 03/30/21 0454 03/31/21 0157 04/01/21 0505  WBC 16.9*  --   --  13.7* 9.8  CREATININE 1.63*  --  1.44* 1.30* 1.27*  LATICACIDVEN 1.2 1.4  --   --   --     CrCl cannot be calculated (Unknown ideal weight.).    No Known Allergies  Kristopher [name], PharmD, BCPS Clinical Staff Pharmacist [email or web address]   Kristopher [name] 04/01/2021 9:15 AM

## 2021-04-01 NOTE — Plan of Care (Signed)
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
## 2021-04-01 NOTE — Progress Notes (Signed)
RT at bedside, RT heard pt's desaturation alarm sounding. Pt's SpO2 was at 83% with good waveform. RT removed pt from 15 salter and placed pt back on 15L NRB. Pt's SPo2 now 91%. Pt in no distress.

## 2021-04-01 NOTE — Progress Notes (Signed)
RT at bedside with morning breathing treatment. Pt was found on 15L NRB with SpO2 at 95%. Pt refused BiPAP but allowed this RT to place him on 15L salter (high flow). Pt appears comfortable. Pt in no distress at this time with clear/diminished BL BS. Pt's SpO2 at this time is 92%. RT will continue to monitor pt.

## 2021-04-01 NOTE — Consult Note (Signed)
Consultation Note Date: 04/01/2021   Patient Name: Kristopher [name]  DOB: October 06, 1946  MRN: [id number]  Age / Sex: 75 y.o., male  PCP: Kristopher Frizzle, MD Referring Physician: [place] Kristopher J, DO  Reason for Consultation: Establishing goals of care  HPI/Patient Profile: 75 y.o. male  with past medical history of grade I diastolic heart failure, COPD, Covid 2020, s/p L nephrectomy, s/p splenectomy, HTN, dysphagia  admitted on 03/29/2021 with respiratory distress. Workup reveals pnuemonia- community acquired. He has required bipap to maintain saturations but did not want to wear it today due to it being uncomfortable. He is requiring intermittent NRB and heated high flow oxygen. Palliative medicine consulted due to patient declining to use bipap and determine how aggressive patient would like his medical care to be.    Clinical Assessment and Goals of Care: I met with patient and his daughter, Jeani Hawking at the bedside.  He is happy that he has been ordered a diet and is being allowed to eat. He shares that he feels better today than he did yesterday.  Prior to admission he was living in the home with his daughter, Jeani Hawking.  He enjoys being "on the go"- he typically leaves home independently in the morning and does not return home until the evening. He spends his time gardening and fishing. He does not wear oxygen at home and has not had difficulty breathing or completing his ADL's prior to this admission.  He does note a poor appetite since his bout of Covid in 2020. He has not lost any weight.  We discussed goals of care- his goals are to be able to maintain his independence and continue to be ambulatory and fish and work in his yard. If he was not going to be able to recover to that point- his wish would be to die naturally and not suffer.  Specific medical interventions were discussed- specifically bipap. He agrees to use  bipap if needed to keep him alive and increase his recovery.  He would be in agreement with intubation if it were for a reversible cause.  He would not want CPR, code blue, and attempts at resuscitation if his heart stopped and he stopped breathing and he had died.   Primary Decision Maker PATIENT    SUMMARY OF RECOMMENDATIONS -DNR -Full scope care otherwise    Code Status/Advance Care Planning: DNR  Prognosis:   Unable to determine  Discharge Planning: Home with Home Health  Primary Diagnoses: Present on Admission:  (Resolved) Pneumonia  Community acquired pneumonia  Sepsis ([place])  Dyslipidemia  Essential hypertension  Acute respiratory failure ([place])  GAD (generalized anxiety disorder)  Acute kidney injury ([place])   I have reviewed the medical record, interviewed the patient and family, and examined the patient. The following aspects are pertinent.  Past Medical History:  Diagnosis Date   Anxiety    takes Xanax prn   Cardiomyopathy    takes Digoxin daily   Depression    takes Prozac daily   Dyslipidemia  takes Simvastatin daily   HTN (hypertension)    takes Carvedilol and Lisinopril daily   Impaired hearing    left   Social History   Socioeconomic History   Marital status: Divorced    Spouse name: Not on file   Number of children: Not on file   Years of education: Not on file   Highest education level: Not on file  Occupational History   Not on file  Tobacco Use   Smoking status: Some Days    Packs/day: 1.00    Years: 50.00    Pack years: 50.00    Types: Cigarettes   Smokeless tobacco: Never   Tobacco comments:    positive for tobacco abuse   Vaping Use   Vaping Use: Never used  Substance and Sexual Activity   Alcohol use: No   Drug use: No   Sexual activity: Never  Other Topics Concern   Not on file  Social History Narrative   Not on file   Social Determinants of Health   Financial Resource Strain: Not on file  Food Insecurity: Not on  file  Transportation Needs: Not on file  Physical Activity: Not on file  Stress: Not on file  Social Connections: Not on file   Scheduled Meds:  acidophilus  1 capsule Oral TID   carvedilol  3.125 mg Oral BID WC   FLUoxetine  20 mg Oral Daily   guaiFENesin  1,200 mg Oral BID   heparin  5,000 Units Subcutaneous Q12H   ipratropium  2.5 mL Inhalation QID   levalbuterol  1.25 mg Inhalation QID   loratadine  10 mg Oral Daily   predniSONE  40 mg Oral Q breakfast   simvastatin  20 mg Oral QHS   Continuous Infusions:  azithromycin 500 mg (03/31/21 1356)   ceFEPime (MAXIPIME) IV 2 g (04/01/21 1145)   lactated ringers 100 mL/hr at 03/31/21 1355   PRN Meds:.ALPRAZolam, carisoprodol, levalbuterol Medications Prior to Admission:  Prior to Admission medications   Medication Sig Start Date End Date Taking? Authorizing Provider  ALPRAZolam Duanne Moron) 0.5 MG tablet Take 1 tablet (0.5 mg total) by mouth 2 (two) times daily as needed for anxiety. 11/20/16  Yes [name], MD  carisoprodol (SOMA) 350 MG tablet Take 350 mg by mouth at bedtime as needed for muscle spasms.   Yes [provider]  carvedilol (COREG) 3.125 MG tablet Take 1 tablet (3.125 mg total) by mouth 2 (two) times daily with a meal. 06/03/19  Yes Allie Bossier, MD  cetirizine (ZYRTEC) 10 MG tablet Take 10 mg by mouth daily.   Yes [provider]  FLUoxetine (PROZAC) 20 MG capsule Take 1 capsule (20 mg total) by mouth daily. Please request next refill from PCP. Thank you 02/12/18  Yes [name], MD  Furosemide (LASIX PO) Take 1 tablet by mouth daily.   Yes [provider]  guaiFENesin (MUCINEX) 600 MG 12 hr tablet Take 2 tablets (1,200 mg total) by mouth 2 (two) times daily. 06/03/19  Yes Allie Bossier, MD  ipratropium (ATROVENT HFA) 17 MCG/ACT inhaler Inhale 2 puffs into the lungs every 8 (eight) hours. 06/03/19  Yes Allie Bossier, MD  levalbuterol [facility] HFA) 45 MCG/ACT inhaler Inhale 2 puffs into the  lungs every 8 (eight) hours. 06/03/19  Yes Allie Bossier, MD  potassium chloride SA (K-DUR) 20 MEQ tablet TAKE 1 TABLET BY MOUTH ONCE A DAY Patient taking differently: Take 10 mEq by mouth daily. 04/12/19  Yes [name], MD  simvastatin (ZOCOR) 20 MG tablet Take 1 tablet (20 mg total) by mouth at bedtime. Please schedule overdue follow up for further refills [phone] 1st attempt Patient taking differently: Take 20 mg by mouth at bedtime. 02/21/20  Yes [name], MD  amLODipine (NORVASC) 10 MG tablet Take 1 tablet (10 mg total) by mouth daily. Patient not taking: No sig reported 06/04/19   Allie Bossier, MD  cholecalciferol (VITAMIN D) 25 MCG tablet Take 1 tablet (1,000 Units total) by mouth daily. Patient not taking: No sig reported 06/04/19   Allie Bossier, MD  nicotine (NICODERM CQ - DOSED IN MG/24 HR) 7 mg/24hr patch Place 1 patch (7 mg total) onto the skin at bedtime. Patient not taking: No sig reported 06/03/19   Allie Bossier, MD  nicotine polacrilex (NICORETTE) 2 MG gum Take 1 each (2 mg total) by mouth as needed for smoking cessation. Patient not taking: No sig reported 06/03/19   Allie Bossier, MD  ondansetron (ZOFRAN) 8 MG tablet Take 8 mg by mouth every 8 (eight) hours as needed for nausea or vomiting. Patient not taking: No sig reported    [provider]  tamsulosin (FLOMAX) 0.4 MG CAPS capsule Take 1 capsule (0.4 mg total) by mouth daily after supper. Patient not taking: No sig reported 06/03/19   Allie Bossier, MD  vitamin C (VITAMIN C) 500 MG tablet Take 1 tablet (500 mg total) by mouth daily. Patient not taking: No sig reported 06/03/19   Allie Bossier, MD  zinc sulfate 220 (50 Zn) MG capsule Take 1 capsule (220 mg total) by mouth daily. Patient not taking: No sig reported 06/03/19   Allie Bossier, MD   No Known Allergies Review of Systems  Physical Exam  Vital Signs: BP 125/79   Pulse 100   Temp 98.9 F (37.2 C) (Oral)   Resp (!) 24   SpO2  91%  Pain Scale: 0-10   Pain Score: 0-No pain   SpO2: SpO2: 91 % O2 Device:SpO2: 91 % O2 Flow Rate: .O2 Flow Rate (L/min): 35 L/min  IO: Intake/output summary:  Intake/Output Summary (Last 24 hours) at 04/01/2021 1326 Last data filed at 04/01/2021 0601 Gross per 24 hour  Intake 2007.03 ml  Output 750 ml  Net 1257.03 ml    LBM: Last BM Date: 03/28/21 Baseline Weight:   Most recent weight:       Palliative Assessment/Data: PPS: 70%     Thank you for this consult. Palliative medicine will continue to follow and assist as needed.   Time In: 1142 Time Out:1259 Time Total: 77 minutes Greater than 50%  of this time was spent counseling and coordinating care related to the above assessment and plan.  Signed by: Mariana Kaufman, AGNP-C Palliative Medicine    Please contact Palliative Medicine Team phone at [phone] for questions and concerns.  For individual provider: See Shea Evans

## 2021-04-01 NOTE — Progress Notes (Signed)
Per order, RT placed pt on HHFNC. Pt tolerating well at this time.

## 2021-04-02 ENCOUNTER — Inpatient Hospital Stay (HOSPITAL_COMMUNITY): Payer: Medicare Other

## 2021-04-02 DIAGNOSIS — I10 Essential (primary) hypertension: Secondary | ICD-10-CM

## 2021-04-02 DIAGNOSIS — N179 Acute kidney failure, unspecified: Secondary | ICD-10-CM

## 2021-04-02 DIAGNOSIS — J690 Pneumonitis due to inhalation of food and vomit: Secondary | ICD-10-CM

## 2021-04-02 LAB — CBC
HCT: 45.1 % (ref 39.0–52.0)
Hemoglobin: 14.7 g/dL (ref 13.0–17.0)
MCH: 28.9 pg (ref 26.0–34.0)
MCHC: 32.6 g/dL (ref 30.0–36.0)
MCV: 88.8 fL (ref 80.0–100.0)
Platelets: 445 10*3/uL — ABNORMAL HIGH (ref 150–400)
RBC: 5.08 MIL/uL (ref 4.22–5.81)
RDW: 14.8 % (ref 11.5–15.5)
WBC: 13.4 10*3/uL — ABNORMAL HIGH (ref 4.0–10.5)
nRBC: 0 % (ref 0.0–0.2)

## 2021-04-02 LAB — BASIC METABOLIC PANEL
Anion gap: 6 (ref 5–15)
BUN: 24 mg/dL — ABNORMAL HIGH (ref 8–23)
CO2: 25 mmol/L (ref 22–32)
Calcium: 8.6 mg/dL — ABNORMAL LOW (ref 8.9–10.3)
Chloride: 108 mmol/L (ref 98–111)
Creatinine, Ser: 1.25 mg/dL — ABNORMAL HIGH (ref 0.61–1.24)
GFR, Estimated: >60 mL/min (ref >60)
Glucose, Bld: 243 mg/dL — ABNORMAL HIGH (ref 70–99)
Potassium: 4.2 mmol/L (ref 3.5–5.1)
Sodium: 139 mmol/L (ref 135–145)

## 2021-04-02 LAB — GLUCOSE, CAPILLARY
Glucose-Capillary: 197 mg/dL — ABNORMAL HIGH (ref 70–99)
Glucose-Capillary: 278 mg/dL — ABNORMAL HIGH (ref 70–99)
Glucose-Capillary: 158 mg/dL — ABNORMAL HIGH (ref 70–99)
Glucose-Capillary: 188 mg/dL — ABNORMAL HIGH (ref 70–99)

## 2021-04-02 MED ORDER — ALPRAZOLAM 0.5 MG PO TABS: 0.5000 mg | ORAL_TABLET | Freq: Three times a day (TID) | ORAL | Status: DC

## 2021-04-02 MED ORDER — IPRATROPIUM BROMIDE 0.02 % IN SOLN
2.5000 mL | Freq: Three times a day (TID) | RESPIRATORY_TRACT | Status: DC
  Administered 2021-04-02 – 2021-04-17 (×48): 0.5 mg via RESPIRATORY_TRACT
  Filled 2021-04-02 (×48): qty 2.5

## 2021-04-02 MED ORDER — ALPRAZOLAM 0.5 MG PO TABS
0.5000 mg | ORAL_TABLET | Freq: Three times a day (TID) | ORAL | Status: DC
  Administered 2021-04-02 – 2021-04-08 (×17): 0.5 mg via ORAL
  Filled 2021-04-02 (×17): qty 1

## 2021-04-02 MED ORDER — LEVALBUTEROL HCL 1.25 MG/0.5ML IN NEBU
1.2500 mg | INHALATION_SOLUTION | Freq: Four times a day (QID) | RESPIRATORY_TRACT | Status: DC
  Administered 2021-04-02: 1.25 mg via RESPIRATORY_TRACT
  Filled 2021-04-02: qty 0.5

## 2021-04-02 MED ORDER — ALPRAZOLAM 0.5 MG PO TABS
0.5000 mg | ORAL_TABLET | Freq: Three times a day (TID) | ORAL | Status: DC | PRN
  Administered 2021-04-02: 0.5 mg via ORAL
  Filled 2021-04-02: qty 1

## 2021-04-02 MED ORDER — INSULIN ASPART 100 UNIT/ML IJ SOLN
0.0000 [IU] | Freq: Three times a day (TID) | INTRAMUSCULAR | Status: DC
  Administered 2021-04-02: 1 [IU] via SUBCUTANEOUS
  Administered 2021-04-02: 3 [IU] via SUBCUTANEOUS
  Administered 2021-04-03: 1 [IU] via SUBCUTANEOUS
  Administered 2021-04-03 (×2): 2 [IU] via SUBCUTANEOUS
  Administered 2021-04-04: 3 [IU] via SUBCUTANEOUS
  Administered 2021-04-04: 4 [IU] via SUBCUTANEOUS
  Administered 2021-04-04: 3 [IU] via SUBCUTANEOUS
  Administered 2021-04-05: 2 [IU] via SUBCUTANEOUS
  Administered 2021-04-05: 1 [IU] via SUBCUTANEOUS
  Administered 2021-04-05: 2 [IU] via SUBCUTANEOUS
  Administered 2021-04-06 – 2021-04-07 (×5): 1 [IU] via SUBCUTANEOUS

## 2021-04-02 MED ORDER — LEVALBUTEROL HCL 1.25 MG/0.5ML IN NEBU
1.2500 mg | INHALATION_SOLUTION | Freq: Three times a day (TID) | RESPIRATORY_TRACT | Status: DC
  Administered 2021-04-02 – 2021-04-09 (×20): 1.25 mg via RESPIRATORY_TRACT
  Filled 2021-04-02 (×21): qty 0.5

## 2021-04-02 MED ORDER — BUDESONIDE 0.5 MG/2ML IN SUSP
0.5000 mg | Freq: Two times a day (BID) | RESPIRATORY_TRACT | Status: DC
  Administered 2021-04-02 – 2021-04-17 (×31): 0.5 mg via RESPIRATORY_TRACT
  Filled 2021-04-02 (×31): qty 2

## 2021-04-02 MED ORDER — INSULIN ASPART 100 UNIT/ML IJ SOLN
0.0000 [IU] | Freq: Every day | INTRAMUSCULAR | Status: DC
  Administered 2021-04-05 – 2021-04-06 (×2): 2 [IU] via SUBCUTANEOUS

## 2021-04-02 MED ORDER — SODIUM CHLORIDE 3 % IN NEBU
4.0000 mL | INHALATION_SOLUTION | Freq: Two times a day (BID) | RESPIRATORY_TRACT | Status: AC
  Administered 2021-04-02 – 2021-04-05 (×5): 4 mL via RESPIRATORY_TRACT
  Filled 2021-04-02 (×6): qty 4

## 2021-04-02 MED ORDER — HYDRALAZINE HCL 50 MG PO TABS: 25.0000 mg | ORAL_TABLET | Freq: Three times a day (TID) | ORAL | Status: DC | PRN

## 2021-04-02 MED ORDER — ONDANSETRON HCL 4 MG/2ML IJ SOLN
4.0000 mg | Freq: Four times a day (QID) | INTRAMUSCULAR | Status: DC | PRN
  Administered 2021-04-04 – 2021-04-07 (×5): 4 mg via INTRAVENOUS
  Filled 2021-04-02 (×5): qty 2

## 2021-04-02 NOTE — Progress Notes (Signed)
Modified Barium Swallow Progress Note  Patient Details  Name: Kristopher [name] MRN: [id number] Date of Birth: December 29, 1945  Today's Date: 04/02/2021  Modified Barium Swallow completed.  Full report located under Chart Review in the Imaging Section.  Brief recommendations include the following:  Clinical Impression  MBS completed while pt on partial non-rebreather, pts sats stayed in the low 90s with brief removal until mild aspiration event caused prolonged coughing, which did result in desat to 87. MIld dysphagia related primarily to respiratory impairment and to variable effort of base of tongue and full epiglottic inversion with intermittent accumultion of vallecular residue. This was particularly noted with larger, consecutive sips of thin. There were subsequent instances of trace sensed penetration and aspiration of residue and also trace aspiration during consecutive swallow. When taking single sips of thin pt did not have vallecular residue and airway closure was sufficient. Pt also tolerated solids and nectar thick liquids well. Given fragility of pts respiratory status, recommend a conservative diet of regular solids and nectar thick liquids until pt can demonstrate reliable adherance to single small sip intake pattern. Will f/u to reinforce.   Swallow Evaluation Recommendations       SLP Diet Recommendations: Regular solids;Nectar thick liquid   Liquid Administration via: Cup;Straw   Medication Administration: Crushed with puree   Supervision: Patient able to self feed   Compensations: Slow rate;Small sips/bites       Oral Care Recommendations: Oral care BID        Hailie Searight, Katherene Ponto 04/02/2021,12:58 PM

## 2021-04-02 NOTE — Progress Notes (Signed)
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  Cough is productive of yellow/green sputum.  Patient reports symptoms of malaise, decreased appetite over the last 3-4 days and daughter noticed patient had increased productive cough with associated nausea, vomiting.  Over the last 2 days, patient has not been able to eat or drink anything.  He also endorses that sometimes he feels "food stuck in his throat", and daughter reports that he sometimes coughs after eating or drinking.  Patient denies history of COPD, but long history of tobacco use disorder, CT scan 2014 shows signs of emphysema but patient never followed up with pulmonology.  Patient reportedly has been using her his daughter COPD medication including albuterol MDI for his coughing chronically.  Patient not followed up with any physician over the last 2 years.  On EMS arrival, patient was noted to have O2 saturation in the 70s and he was initially placed on CPAP.  In the ED, WBC 16.9, bilirubin 2.1, AST 76, creatinine 1.6.  Chest x-ray notable for bilateral infiltrates concerning for pneumonia.  Hospital service consulted for further evaluation and treatment of acute hypoxic respiratory failure secondary to pneumonia and COPD exacerbation.   Assessment & Plan:   Principal Problem:   Acute respiratory failure (HCC) Active Problems:   Dyslipidemia   Essential hypertension   Acute kidney injury ([place])   Community acquired pneumonia   Sepsis ([place])   GAD (generalized anxiety  disorder)   Dysphagia   Acute hypoxic respiratory failure, POA Patient presenting with productive cough with green/yellow sputum in the setting of longstanding history of tobacco use disorder.  Patient was found to be hypoxic by EMS with SPO2 in the 70s initially requiring CPAP.  Etiology likely secondary to pneumonia and COPD exacerbation.  Continue treatment as below.  Sepsis, POA Community-acquired versus aspiration pneumonia Patient presenting with progressive shortness of breath associated with yellow/green sputum.  Patient was notably dyspneic and hypoxic on arrival.  CT chest with airspace opacities noted in the lingula, bilateral lobes with bronchial plugging consistent with multifocal infection versus aspiration. --WBC 16.9>13.7>9.8>13.4 --Azithromycin 500 mg IV every 24 hours x 5 days --Cefepime 2g q12h x 7 days --Chest physiotherapy --Mucinex --Continue supplemental oxygen, maintain SPO2 greater than 88%; currently on HHFNC w/ FiO2 70%, 20L --Flutter valve/incentive spirometry --Bipap prn  COPD exacerbation Patient with longstanding history of tobacco use disorder.  Has been utilizing his daughter's albuterol MDI recently.  No formal diagnosis but imaging notable for emphysematous changes in the lungs. --Xopenex neb every 4 hours --Atrovent nebs every 4 hours --Solumedrol '60mg'$  IV q8h --Mucinex 1200 mg PO BID  Acute renal failure Creatinine 1.63 on admission.  Etiology likely prerenal azotemia versus ATN secondary to sepsis/pneumonia as above. --Cr 1.63>1.44>1.30>1.27>1.25 --Holding home furosemide --dc IVF today --Avoid nephrotoxins, renally dose all medications --Repeat BMP in the a.m.  Chronic combined systolic and diastolic congestive heart failure, compensated Patient with history of chronic systolic congestive heart failure.  TTE with LVEF now improved to 60-65% (was 40%in 2014) with grade 1 diastolic dysfunction and trivial MR. --Continue carvedilol 3.125 mg p.o.  twice daily --Holding home furosemide due to AKI --Strict I's and O's and daily weights  Dysphagia Patient reports sometimes feeling food getting stuck in his throat, and daughter reports that he sometimes coughs after eating/drinking. --Speech therapy following, appreciate assistance --MBS: Pending --Dysphagia 3 diet --Aspiration precautions  Essential hypertension BP 146/63 --Carvedilol 3.125 mg p.o. twice daily --Hydralazine 25 mg p.o. every 8 hours prn SBP >180 or DBP >110  Generalized anxiety disorder --Prozac 20 mg p.o. daily --Xanax 0.'5mg'$  PO BID prn  HLD: Zocor 20 mg p.o. daily     DVT prophylaxis: heparin injection 5,000 Units Start: 03/29/21 1500    Code Status: DNR Family Communication: Attempted to update patient's daughter Jeani Hawking via telephone unsuccessful.  She was updated extensively by telephone yesterday.  Voicemail left today. Disposition Plan:  Level of care: Telemetry Medical Status is: Inpatient  Remains inpatient appropriate because:Ongoing diagnostic testing needed not appropriate for outpatient work up, Unsafe d/c plan, IV treatments appropriate due to intensity of illness or inability to take PO, and Inpatient level of care appropriate due to severity of illness  Dispo: The patient is from: Home              Anticipated d/c is to: Home              Patient currently is not medically stable to d/c.   Difficult to place patient No   Consultants:  none  Procedures:  TTE MBS: pending  Antimicrobials:  Azithromycin 6/17>> Cefepime 6/17>> Ceftriaxone 6/17 - 6/17   Subjective: Patient seen examined bedside, resting comfortably.  Continues on heated high flow nasal cannula with FiO2 70%, 20 L; reduced from yesterday.  Dyspnea improved.  No family present at bedside this morning.  Complaining of/requesting bedside commode and to get out of bed today.  No other questions or concerns at this time. Denies headache, no fever/chills/night sweats, no  current nausea/vomiting/diarrhea, no chest pain, no palpitations, no abdominal pain, no fatigue, no paresthesias.  No acute events overnight per nursing staff.  Objective: Vitals:   04/01/21 2330 04/02/21 0000 04/02/21 0436 04/02/21 0815  BP:   (!) 146/63   Pulse:  82 84 80  Resp:   (!) 28 (!) 24  Temp:  98.2 F (36.8 C)    TempSrc:  Oral    SpO2: 97%  98% 93%    Intake/Output Summary (Last 24 hours) at 04/02/2021 1036 Last data filed at 04/02/2021 0800 Gross per 24 hour  Intake 0 ml  Output --  Net 0 ml   There were no vitals filed for this visit.  Examination:  General exam: Calm/comfortable, heated high flow nasal cannula noted, appears older than stated age and chronically ill in appearance Respiratory system: Coarse breath sounds bilaterally with mid expiratory wheezing throughout all lung fields, no crackles/rhonchi, on HHFNC w/ FiO2 70% and 20L Cardiovascular system: S1 & S2 heard, RRR. No JVD, murmurs, rubs, gallops or clicks. No pedal edema. Gastrointestinal system: Abdomen is nondistended, soft and nontender. No organomegaly or masses felt. Normal bowel sounds heard. Central nervous system: Alert and oriented. No focal neurological deficits. Extremities: Symmetric 5 x 5 power. Skin: No rashes, lesions or ulcers Psychiatry: Judgement and insight appear poor.  Mood & affect appropriate.     Data Reviewed: I have personally reviewed following labs and imaging studies  CBC: Recent Labs  Lab 03/29/21 1100 03/31/21 0157 04/01/21 0505 04/02/21 0034  WBC 16.9* 13.7* 9.8 13.4*  NEUTROABS 12.7*  --   --   --  HGB 16.9 14.5 14.5 14.7  HCT 51.5 44.9 44.9 45.1  MCV 87.7 88.6 89.1 88.8  PLT 417* 374 413* [id number]*   Basic Metabolic Panel: Recent Labs  Lab 03/29/21 1100 03/30/21 0454 03/31/21 0157 04/01/21 0505 04/02/21 0034  NA 134* 135 138 139 139  K 4.5 3.5 3.7 3.8 4.2  CL 100 104 107 112* 108  CO2 '25 24 22 22 25  '$ GLUCOSE 124* 130* 93 177* 243*  BUN '12 12 11  21 '$ 24*  CREATININE 1.63* 1.44* 1.30* 1.27* 1.25*  CALCIUM 8.3* 7.9* 8.1* 8.4* 8.6*   GFR: CrCl cannot be calculated (Unknown ideal weight.). Liver Function Tests: Recent Labs  Lab 03/29/21 1100 03/30/21 0454  AST 76* 38  ALT 44 32  ALKPHOS 138* 111  BILITOT 2.1* 1.4*  PROT 7.6 6.1*  ALBUMIN 3.2* 2.4*   Recent Labs  Lab 03/29/21 1754  LIPASE 77*   No results for input(s): AMMONIA in the last 168 hours. Coagulation Profile: No results for input(s): INR, PROTIME in the last 168 hours. Cardiac Enzymes: No results for input(s): CKTOTAL, CKMB, CKMBINDEX, TROPONINI in the last 168 hours. BNP (last 3 results) No results for input(s): PROBNP in the last 8760 hours. HbA1C: No results for input(s): HGBA1C in the last 72 hours. CBG: Recent Labs  Lab 04/02/21 0731  GLUCAP 197*   Lipid Profile: No results for input(s): CHOL, HDL, LDLCALC, TRIG, CHOLHDL, LDLDIRECT in the last 72 hours. Thyroid Function Tests: No results for input(s): TSH, T4TOTAL, FREET4, T3FREE, THYROIDAB in the last 72 hours. Anemia Panel: No results for input(s): VITAMINB12, FOLATE, FERRITIN, TIBC, IRON, RETICCTPCT in the last 72 hours. Sepsis Labs: Recent Labs  Lab 03/29/21 1100 03/29/21 1217  LATICACIDVEN 1.2 1.4    Recent Results (from the past 240 hour(s))  Resp Panel by RT-PCR (Flu A&B, Covid) Nasopharyngeal Swab     Status: None   Collection Time: 03/29/21 10:38 AM   Specimen: Nasopharyngeal Swab; Nasopharyngeal(NP) swabs in vial transport medium  Result Value Ref Range Status   SARS Coronavirus 2 by RT PCR NEGATIVE NEGATIVE Final    Comment: (NOTE) SARS-CoV-2 target nucleic acids are NOT DETECTED.  The SARS-CoV-2 RNA is generally detectable in upper respiratory specimens during the acute phase of infection. The lowest concentration of SARS-CoV-2 viral copies this assay can detect is 138 copies/mL. A negative result does not preclude SARS-Cov-2 infection and should not be used as the sole  basis for treatment or other patient management decisions. A negative result may occur with  improper specimen collection/handling, submission of specimen other than nasopharyngeal swab, presence of viral mutation(s) within the areas targeted by this assay, and inadequate number of viral copies(<138 copies/mL). A negative result must be combined with clinical observations, patient history, and epidemiological information. The expected result is Negative.  Fact Sheet for Patients:  EntrepreneurPulse.com.au  Fact Sheet for Healthcare Providers:  IncredibleEmployment.be  This test is no t yet approved or cleared by the [place] FDA and  has been authorized for detection and/or diagnosis of SARS-CoV-2 by FDA under an Emergency Use Authorization (EUA). This EUA will remain  in effect (meaning this test can be used) for the duration of the COVID-19 declaration under Section 564(b)(1) of the Act, 21 U.S.C.section 360bbb-3(b)(1), unless the authorization is terminated  or revoked sooner.       Influenza A by PCR NEGATIVE NEGATIVE Final   Influenza B by PCR NEGATIVE NEGATIVE Final    Comment: (NOTE) The Xpert Xpress SARS-CoV-2/FLU/RSV plus assay  is intended as an aid in the diagnosis of influenza from Nasopharyngeal swab specimens and should not be used as a sole basis for treatment. Nasal washings and aspirates are unacceptable for Xpert Xpress SARS-CoV-2/FLU/RSV testing.  Fact Sheet for Patients: EntrepreneurPulse.com.au  Fact Sheet for Healthcare Providers: IncredibleEmployment.be  This test is not yet approved or cleared by the [place] FDA and has been authorized for detection and/or diagnosis of SARS-CoV-2 by FDA under an Emergency Use Authorization (EUA). This EUA will remain in effect (meaning this test can be used) for the duration of the COVID-19 declaration under Section 564(b)(1) of the Act,  21 U.S.C. section 360bbb-3(b)(1), unless the authorization is terminated or revoked.  Performed at [facility], [place] [address], [place], [place] [address]   Urine culture     Status: Abnormal   Collection Time: 03/29/21 11:55 AM   Specimen: In/Out Cath Urine  Result Value Ref Range Status   Specimen Description IN/OUT CATH URINE  Final   Special Requests   Final    NONE Performed at [facility], [place] [address] [place], [place] [address]    Culture 2,000 COLONIES/mL STAPHYLOCOCCUS EPIDERMIDIS (A)  Final   Report Status 03/31/2021 FINAL  Final   Organism ID, Bacteria STAPHYLOCOCCUS EPIDERMIDIS (A)  Final      Susceptibility   Staphylococcus epidermidis - MIC*    CIPROFLOXACIN <=0.5 SENSITIVE Sensitive     GENTAMICIN <=0.5 SENSITIVE Sensitive     NITROFURANTOIN <=16 SENSITIVE Sensitive     OXACILLIN <=0.25 SENSITIVE Sensitive     TETRACYCLINE <=1 SENSITIVE Sensitive     VANCOMYCIN 2 SENSITIVE Sensitive     TRIMETH/SULFA <=10 SENSITIVE Sensitive     CLINDAMYCIN <=0.25 SENSITIVE Sensitive     RIFAMPIN <=0.5 SENSITIVE Sensitive     Inducible Clindamycin NEGATIVE Sensitive     * 2,000 COLONIES/mL STAPHYLOCOCCUS EPIDERMIDIS         Radiology Studies: No results found.      Scheduled Meds:  acidophilus  1 capsule Oral TID   carvedilol  3.125 mg Oral BID WC   FLUoxetine  20 mg Oral Daily   guaiFENesin  1,200 mg Oral BID   heparin  5,000 Units Subcutaneous Q12H   insulin aspart  0-5 Units Subcutaneous QHS   insulin aspart  0-6 Units Subcutaneous TID WC   ipratropium  2.5 mL Inhalation TID   levalbuterol  1.25 mg Inhalation QID   loratadine  10 mg Oral Daily   methylPREDNISolone (SOLU-MEDROL) injection  60 mg Intravenous Q8H   simvastatin  20 mg Oral QHS   Continuous Infusions:  azithromycin 500 mg (04/01/21 1344)   ceFEPime (MAXIPIME) IV 2 g (04/02/21 0829)   lactated ringers 100 mL/hr at 04/02/21 0827     LOS: 4 days    Time spent: 38 minutes  spent on chart review, discussion with nursing staff, consultants, updating family and interview/physical exam; more than 50% of that time was spent in counseling and/or coordination of care.    Kindsey Eblin J [place] DO [facility] Available via Epic secure chat 7am-7pm After these hours, please refer to coverage provider listed on [email or web address] 04/02/2021, 10:36 AM

## 2021-04-02 NOTE — Progress Notes (Signed)
Daily Progress Note   Patient Name: Kristopher [name]       Date: 04/02/2021 DOB: 1946/09/14  Age: 75 y.o. MRN#: [id number] Attending Physician: [place] Eric J, DO Primary Care Physician: Susy Frizzle, MD Admit Date: 03/29/2021  Reason for Consultation/Follow-up: Establishing goals of care  Subjective: Called to bedside by RN due to patient's daughter requesting to reverse code status.  She notes patient had a "panic attack" last night.  Discussed with patient. Clarified meaning of DNR. He was under the impression that DNR meant "do nothing" and became worried last night when his IV malfunctioned and it was taken out- that meant he wasn't going to receive anymore treatments.  I clarified with him that DNR means that if he has no pulse and is not breathing- then CPR will not be administered in efforts to try and bring him back.  However, all efforts- including intubation if needed- to PREVENT cardiac and respiratory arrest AND ALL interventions to try and reverse his treatable acute conditions will continue.  He and his daughter were in agreement with this plan of care.  We also discussed his anxiety. He has been taking xanax three times daily since the death of his son. He would like to resume his xanax with scheduled dosing. I think this is reasonable and may also assist his respiratory status.  He also had questions about his COPD diagnosis and our discussion regarding the trajectory of emphysema and COPD- chronic, progressive with flares that worsen over time. As well as how his bout of Covid likely worsened his lung status.  Also reviewed his dysphagia and how aspiration leads to pneumonia and COPD flares.   Length of Stay: 4  Current Medications: Scheduled Meds:   acidophilus  1  capsule Oral TID   ALPRAZolam  0.5 mg Oral TID   carvedilol  3.125 mg Oral BID WC   FLUoxetine  20 mg Oral Daily   guaiFENesin  1,200 mg Oral BID   heparin  5,000 Units Subcutaneous Q12H   insulin aspart  0-5 Units Subcutaneous QHS   insulin aspart  0-6 Units Subcutaneous TID WC   ipratropium  2.5 mL Inhalation TID   levalbuterol  1.25 mg Inhalation Q6H   loratadine  10 mg Oral Daily   methylPREDNISolone (SOLU-MEDROL) injection  60 mg Intravenous Q8H  simvastatin  20 mg Oral QHS   sodium chloride HYPERTONIC  4 mL Nebulization BID    Continuous Infusions:  ceFEPime (MAXIPIME) IV 2 g (04/02/21 0829)    PRN Meds: carisoprodol, hydrALAZINE, levalbuterol, ondansetron (ZOFRAN) IV  Physical Exam Vitals and nursing note reviewed.  Cardiovascular:     Rate and Rhythm: Normal rate and regular rhythm.  Pulmonary:     Effort: Pulmonary effort is normal.  Neurological:     Mental Status: He is alert.  Psychiatric:     Comments: Flat affect, anxious at times            Vital Signs: BP (!) 143/80   Pulse 80   Temp 98.2 F (36.8 C) (Oral)   Resp (!) 34   SpO2 (!) 87%  SpO2: SpO2: (!) 87 % O2 Device: O2 Device: High Flow Nasal Cannula (HHFNC) O2 Flow Rate: O2 Flow Rate (L/min): 20 L/min  Intake/output summary:  Intake/Output Summary (Last 24 hours) at 04/02/2021 1331 Last data filed at 04/02/2021 1000 Gross per 24 hour  Intake 360 ml  Output --  Net 360 ml   LBM: Last BM Date: 04/02/21 Baseline Weight:   Most recent weight:         Palliative Assessment/Data: PPS: 50%      Patient Active Problem List   Diagnosis Date Noted   Dysphagia 03/31/2021   Sepsis ([place]) 03/30/2021   Acute respiratory failure ([place]) 03/30/2021   GAD (generalized anxiety disorder) 03/30/2021   Community acquired pneumonia 03/29/2021   LFT elevation    Acute kidney injury ([place])    Grief at loss of child 08/20/2012   Healthcare maintenance 11/29/2011   TOBACCO ABUSE 11/23/2009    Dyslipidemia 05/01/2009   Essential hypertension 05/01/2009   CARDIOMYOPATHY 05/01/2009    Palliative Care Assessment & Plan   Patient Profile: 75 y.o. male  with past medical history of grade I diastolic heart failure, COPD, Covid 2020, s/p L nephrectomy, s/p splenectomy, HTN, dysphagia  admitted on 03/29/2021 with respiratory distress. Workup reveals pnuemonia- community acquired. He has required bipap to maintain saturations but did not want to wear it today due to it being uncomfortable. He is requiring intermittent NRB and heated high flow oxygen. Palliative medicine consulted due to patient declining to use bipap and determine how aggressive patient would like his medical care to be.   Assessment/Recommendations/Plan  Continue current interventions DNR Ok to intubate if needed to preserve respiratory status   Goals of Care and Additional Recommendations: Limitations on Scope of Treatment: Full Scope Treatment  Code Status: DNR  Prognosis:  Unable to determine  Discharge Planning: Home with [facility] was discussed with patient and his daughter.   Thank you for allowing the Palliative Medicine Team to assist in the care of this patient.   Total time: 46 mins Greater than 50%  of this time was spent counseling and coordinating care related to the above assessment and plan.  Mariana Kaufman, AGNP-C Palliative Medicine   Please contact Palliative Medicine Team phone at [phone] for questions and concerns.

## 2021-04-02 NOTE — Plan of Care (Signed)
  Problem: Clinical Measurements: Goal: Respiratory complications will improve Outcome: Progressing   Problem: Safety: Goal: Ability to remain free from injury will improve Outcome: Progressing   Problem: Skin Integrity: Goal: Risk for impaired skin integrity will decrease Outcome: Progressing

## 2021-04-02 NOTE — Progress Notes (Signed)
Dr. [place] notified of patient's 20 second burst of SVT, will continue to monitor.

## 2021-04-03 LAB — BASIC METABOLIC PANEL
Anion gap: 6 (ref 5–15)
BUN: 23 mg/dL (ref 8–23)
CO2: 30 mmol/L (ref 22–32)
Calcium: 8.8 mg/dL — ABNORMAL LOW (ref 8.9–10.3)
Chloride: 106 mmol/L (ref 98–111)
Creatinine, Ser: 1.24 mg/dL (ref 0.61–1.24)
GFR, Estimated: >60 mL/min (ref >60)
Glucose, Bld: 208 mg/dL — ABNORMAL HIGH (ref 70–99)
Potassium: 3.5 mmol/L (ref 3.5–5.1)
Sodium: 142 mmol/L (ref 135–145)

## 2021-04-03 LAB — CBC
HCT: 43.9 % (ref 39.0–52.0)
Hemoglobin: 14.4 g/dL (ref 13.0–17.0)
MCH: 28.9 pg (ref 26.0–34.0)
MCHC: 32.8 g/dL (ref 30.0–36.0)
MCV: 88.2 fL (ref 80.0–100.0)
Platelets: 436 10*3/uL — ABNORMAL HIGH (ref 150–400)
RBC: 4.98 MIL/uL (ref 4.22–5.81)
RDW: 15 % (ref 11.5–15.5)
WBC: 11.2 10*3/uL — ABNORMAL HIGH (ref 4.0–10.5)
nRBC: 0 % (ref 0.0–0.2)

## 2021-04-03 LAB — CULTURE, BLOOD (SINGLE)
Culture: NO GROWTH
Culture: NO GROWTH
Special Requests: ADEQUATE
Special Requests: ADEQUATE

## 2021-04-03 LAB — GLUCOSE, CAPILLARY
Glucose-Capillary: 154 mg/dL — ABNORMAL HIGH (ref 70–99)
Glucose-Capillary: 218 mg/dL — ABNORMAL HIGH (ref 70–99)
Glucose-Capillary: 214 mg/dL — ABNORMAL HIGH (ref 70–99)
Glucose-Capillary: 187 mg/dL — ABNORMAL HIGH (ref 70–99)

## 2021-04-03 LAB — HEMOGLOBIN A1C
Hgb A1c MFr Bld: 6.5 % — ABNORMAL HIGH (ref 4.8–5.6)
Mean Plasma Glucose: 140 mg/dL
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
## 2021-04-03 NOTE — Progress Notes (Signed)
Pt refused BiPAP/CPAP tonight
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
## 2021-04-03 NOTE — Progress Notes (Signed)
Inpatient Rehab Admissions Coordinator Note:   Per PT recommendations, pt was screened for CIR candidacy by Shann Medal, PT, DPT.  At this time pt mobilizing at min assist level, mobility limited ?due to significant supplemental O2 demand.  He currently is not near a level we could pursue for CIR. I would not recommend a consult at this time.  I will follow from a distance and rescreen once O2 needs decrase.  Please contact me with questions.   Shann Medal, PT, DPT [phone] 04/03/21 1:32 PM
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
## 2021-04-04 ENCOUNTER — Inpatient Hospital Stay (HOSPITAL_COMMUNITY): Payer: Medicare Other

## 2021-04-04 LAB — CBC
HCT: 45.9 % (ref 39.0–52.0)
Hemoglobin: 15.3 g/dL (ref 13.0–17.0)
MCH: 29.1 pg (ref 26.0–34.0)
MCHC: 33.3 g/dL (ref 30.0–36.0)
MCV: 87.4 fL (ref 80.0–100.0)
Platelets: 471 10*3/uL — ABNORMAL HIGH (ref 150–400)
RBC: 5.25 MIL/uL (ref 4.22–5.81)
RDW: 15.1 % (ref 11.5–15.5)
WBC: 11 10*3/uL — ABNORMAL HIGH (ref 4.0–10.5)
nRBC: 0 % (ref 0.0–0.2)

## 2021-04-04 LAB — BASIC METABOLIC PANEL
Anion gap: 6 (ref 5–15)
BUN: 22 mg/dL (ref 8–23)
CO2: 31 mmol/L (ref 22–32)
Calcium: 8.9 mg/dL (ref 8.9–10.3)
Chloride: 106 mmol/L (ref 98–111)
Creatinine, Ser: 1.22 mg/dL (ref 0.61–1.24)
GFR, Estimated: >60 mL/min (ref >60)
Glucose, Bld: 188 mg/dL — ABNORMAL HIGH (ref 70–99)
Potassium: 3.4 mmol/L — ABNORMAL LOW (ref 3.5–5.1)
Sodium: 143 mmol/L (ref 135–145)

## 2021-04-04 LAB — GLUCOSE, CAPILLARY
Glucose-Capillary: 252 mg/dL — ABNORMAL HIGH (ref 70–99)
Glucose-Capillary: 249 mg/dL — ABNORMAL HIGH (ref 70–99)
Glucose-Capillary: 304 mg/dL — ABNORMAL HIGH (ref 70–99)
Glucose-Capillary: 186 mg/dL — ABNORMAL HIGH (ref 70–99)

## 2021-04-04 LAB — BRAIN NATRIURETIC PEPTIDE: B Natriuretic Peptide: 691.1 pg/mL — ABNORMAL HIGH (ref 0.0–100.0)

## 2021-04-04 MED ORDER — AMLODIPINE BESYLATE 5 MG PO TABS
5.0000 mg | ORAL_TABLET | Freq: Every day | ORAL | Status: DC
  Administered 2021-04-04 – 2021-04-05 (×2): 5 mg via ORAL
  Filled 2021-04-04 (×2): qty 1

## 2021-04-04 MED ORDER — POTASSIUM CHLORIDE CRYS ER 20 MEQ PO TBCR
40.0000 meq | EXTENDED_RELEASE_TABLET | Freq: Once | ORAL | Status: AC
  Administered 2021-04-04: 40 meq via ORAL
  Filled 2021-04-04: qty 2

## 2021-04-04 MED ORDER — METHYLPREDNISOLONE SODIUM SUCC 40 MG IJ SOLR
40.0000 mg | Freq: Three times a day (TID) | INTRAMUSCULAR | Status: DC
  Administered 2021-04-04 – 2021-04-06 (×6): 40 mg via INTRAVENOUS
  Filled 2021-04-04 (×6): qty 1

## 2021-04-04 MED ORDER — FUROSEMIDE 10 MG/ML IJ SOLN
40.0000 mg | Freq: Two times a day (BID) | INTRAMUSCULAR | Status: DC
  Administered 2021-04-04 – 2021-04-05 (×4): 40 mg via INTRAVENOUS
  Filled 2021-04-04 (×4): qty 4

## 2021-04-04 NOTE — Progress Notes (Signed)
RT NOTES: CPT held at this time d/t patient eating.
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
## 2021-04-04 NOTE — Progress Notes (Signed)
Inpatient Diabetes Program Recommendations  AACE/ADA: New Consensus Statement on Inpatient Glycemic Control (2015)  Target Ranges:  Prepandial:   less than 140 mg/dL      Peak postprandial:   less than 180 mg/dL (1-2 hours)      Critically ill patients:  140 - 180 mg/dL   Lab Results  Component Value Date   GLUCAP 304 (H) 04/04/2021   HGBA1C 6.5 (H) 04/02/2021    Review of Glycemic Control  Diabetes history: No hx Outpatient Diabetes medications: None Current orders for Inpatient glycemic control: Novolog 0-6 units TID with meals and 0-5 HS  On Solumedrol 40 mg Q8H HgbA1C - 6.5%  Inpatient Diabetes Program Recommendations:    Consider adding Novolog 3 units TID with meals while on steroids  Continue to follow.  Thank you. Lorenda Peck, RD, LDN, CDE Inpatient Diabetes Coordinator [phone]
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
## 2021-04-04 NOTE — Progress Notes (Signed)
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  Cough is productive of yellow/green sputum.  Patient reports symptoms of malaise, decreased appetite over the last 3-4 days and daughter noticed patient had increased productive cough with associated nausea, vomiting.  Over the last 2 days, patient has not been able to eat or drink anything.  He also endorses that sometimes he feels "food stuck in his throat", and daughter reports that he sometimes coughs after eating or drinking.  Patient denies history of COPD, but long history of tobacco use disorder, CT scan 2014 shows signs of emphysema but patient never followed up with pulmonology.  Patient reportedly has been using her his daughter COPD medication including albuterol MDI for his coughing chronically.  Patient not followed up with any physician over the last 2 years.  On EMS arrival, patient was noted to have O2 saturation in the 70s and he was initially placed on CPAP.  In the ED, WBC 16.9, bilirubin 2.1, AST 76, creatinine 1.6.  Chest x-ray notable for bilateral infiltrates concerning for pneumonia.  Hospital service consulted for further evaluation and treatment of acute hypoxic respiratory failure secondary to pneumonia and COPD exacerbation.   Assessment & Plan:   Principal Problem:   Acute respiratory failure (HCC) Active Problems:   Dyslipidemia   Essential hypertension   Acute kidney injury ([place])   Community acquired pneumonia   Sepsis ([place])   GAD (generalized anxiety  disorder)   Dysphagia   Acute hypoxic respiratory failure, POA Patient presenting with productive cough with green/yellow sputum in the setting of longstanding history of tobacco use disorder.  Patient was found to be hypoxic by EMS with SPO2 in the 70s initially requiring CPAP.  Etiology likely secondary to pneumonia and COPD exacerbation.  Continue treatment as below.  Sepsis, POA Community-acquired versus aspiration pneumonia Patient presenting with progressive shortness of breath associated with yellow/green sputum.  Patient was notably dyspneic and hypoxic on arrival.  CT chest with airspace opacities noted in the lingula, bilateral lobes with bronchial plugging consistent with multifocal infection versus aspiration.  Completed 5-day course of azithromycin. --WBC 16.9>13.7>9.8>13.4>11.2>11.0 --Cefepime 2g q12h x 7 days --Chest physiotherapy --Mucinex --Continue supplemental oxygen, maintain SPO2 greater than 88%; currently on HHFNC w/ FiO2 100% --Flutter valve/incentive spirometry --Bipap prn and QHS  COPD exacerbation Patient with longstanding history of tobacco use disorder.  Has been utilizing his daughter's albuterol MDI recently.  No formal diagnosis but imaging notable for emphysematous changes in the lungs. --Xopenex neb every 4 hours --Atrovent nebs every 4 hours --Solumedrol '40mg'$  IV q8h --Pulmicort nebs twice daily --Mucinex 1200 mg PO BID --Continue supplemental oxygen as above  Acute renal failure Creatinine 1.63 on admission.  Etiology likely prerenal azotemia versus ATN secondary to sepsis/pneumonia as above. --Cr 1.63>1.44>1.30>1.27>1.25>1.24>1.22 --Holding home furosemide --Avoid nephrotoxins, renally dose all medications --Repeat BMP in the a.m.  Chronic combined systolic and diastolic congestive heart failure, compensated Patient with history of chronic systolic congestive heart failure.  TTE with LVEF now improved to 60-65% (was 40%in 2014) with grade 1  diastolic dysfunction and trivial MR. BNP elevated  691.1. --Continue carvedilol 3.125 mg p.o. twice daily --Start furosemide 40 mg IV every 12 hours --Strict I's and O's and daily weights --BMP daily  Hypokalemia Potassium 3.4, will replete. --Repeat electrolytes in a.m. to include magnesium  Dysphagia Patient reports sometimes feeling food getting stuck in his throat, and daughter reports that he sometimes coughs after eating/drinking.  Speech therapy was consulted and has followed during hospital course.  Underwent MBS on 04/02/2021 with mild aspiration event caused by prolonged coughing; symptoms related primarily to respiratory impairments and with larger consecutive sips of thin liquids on consecutive swallows. --Speech therapy following, appreciate assistance --Soft diet with nectar thick liquids --Aspiration precautions, needs to sit in a full upright position and slow swallowing while eating  Essential hypertension BP 181/80 this morning. --Carvedilol 3.125 mg p.o. twice daily --start amlodipine 5 mg p.o. daily today --Hydralazine 25 mg p.o. every 8 hours prn SBP >180 or DBP >110 --Continue monitor BP and adjust antihypertensives as needed  Generalized anxiety disorder --Prozac 20 mg p.o. daily --Xanax 0.'5mg'$  PO TID prn  HLD: Zocor 20 mg p.o. daily    DVT prophylaxis: heparin injection 5,000 Units Start: 03/29/21 1500   Code Status: Full Code Family Communication: Attempted to update patient's daughter, Jeani Hawking via telephone unsuccessful, extensively updated yesterday via telephone. Disposition Plan:  Level of care: Telemetry Medical Status is: Inpatient  Remains inpatient appropriate because:Ongoing diagnostic testing needed not appropriate for outpatient work up, Unsafe d/c plan, IV treatments appropriate due to intensity of illness or inability to take PO, and Inpatient level of care appropriate due to severity of illness  Dispo: The patient is from: Home               Anticipated d/c is to: Home              Patient currently is not medically stable to d/c.   Difficult to place patient No   Consultants:  none  Procedures:  TTE MBS  Antimicrobials:  Azithromycin 6/17 - 6/22 Cefepime 6/17>> Ceftriaxone 6/17 - 6/17   Subjective: Patient seen examined bedside, resting comfortably.  Patient states he feels his breathing has improved.  No further coughing with eating, has been following the speech therapist recommendations.  Did not utilize BiPAP overnight, discussed with him extensively today that he would benefit at least overnight or while sleeping for his dyspnea.  No other questions or concerns at this time.  No family present at bedside this morning; no answer from patient's daughter via telephone this morning. Denies headache, no fever/chills/night sweats, no current nausea/vomiting/diarrhea, no chest pain, no palpitations, no abdominal pain, no fatigue, no paresthesias.  No acute events overnight per nursing staff.  Objective: Vitals:   04/03/21 1959 04/04/21 0100 04/04/21 0544 04/04/21 0840  BP: (!) 144/68 (!) 155/59 (!) 181/80   Pulse: 84 82 86   Resp: (!) 28 (!) 36 (!) 32   Temp:   98.2 F (36.8 C)   TempSrc:      SpO2: 92% 94% 93% (!) 89%  Weight:      Height:        Intake/Output Summary (Last 24 hours) at 04/04/2021 1108 Last data filed at 04/04/2021 0100 Gross per 24 hour  Intake --  Output 400 ml  Net -400 ml   Filed Weights   04/02/21 1857  Weight: 71 kg    Examination:  General exam: Calm/comfortable, heated high flow nasal cannula noted, appears older than stated age and chronically ill in appearance Respiratory  system: Coarse breath sounds bilaterally with mid expiratory wheezing throughout all lung fields, no crackles/rhonchi, no accessory muscle use, on HHFNC w/ FiO2 100% Cardiovascular system: S1 & S2 heard, RRR. No JVD, murmurs, rubs, gallops or clicks. No pedal edema. Gastrointestinal system: Abdomen is  nondistended, soft and nontender. No organomegaly or masses felt. Normal bowel sounds heard. Central nervous system: Alert and oriented. No focal neurological deficits. Extremities: Symmetric 5 x 5 power. Skin: No rashes, lesions or ulcers Psychiatry: Judgement and insight appear poor.  Mood & affect appropriate.     Data Reviewed: I have personally reviewed following labs and imaging studies  CBC: Recent Labs  Lab 03/29/21 1100 03/31/21 0157 04/01/21 0505 04/02/21 0034 04/03/21 0037 04/04/21 0137  WBC 16.9* 13.7* 9.8 13.4* 11.2* 11.0*  NEUTROABS 12.7*  --   --   --   --   --   HGB 16.9 14.5 14.5 14.7 14.4 15.3  HCT 51.5 44.9 44.9 45.1 43.9 45.9  MCV 87.7 88.6 89.1 88.8 88.2 87.4  PLT 417* 374 413* 445* 436* [id number]*   Basic Metabolic Panel: Recent Labs  Lab 03/31/21 0157 04/01/21 0505 04/02/21 0034 04/03/21 0037 04/04/21 0137  NA 138 139 139 142 143  K 3.7 3.8 4.2 3.5 3.4*  CL 107 112* 108 106 106  CO2 '22 22 25 30 31  '$ GLUCOSE 93 177* 243* 208* 188*  BUN 11 21 24* 23 22  CREATININE 1.30* 1.27* 1.25* 1.24 1.22  CALCIUM 8.1* 8.4* 8.6* 8.8* 8.9   GFR: Estimated Creatinine Clearance: 52.3 mL/min (by C-G formula based on SCr of 1.22 mg/dL). Liver Function Tests: Recent Labs  Lab 03/29/21 1100 03/30/21 0454  AST 76* 38  ALT 44 32  ALKPHOS 138* 111  BILITOT 2.1* 1.4*  PROT 7.6 6.1*  ALBUMIN 3.2* 2.4*   Recent Labs  Lab 03/29/21 1754  LIPASE 77*   No results for input(s): AMMONIA in the last 168 hours. Coagulation Profile: No results for input(s): INR, PROTIME in the last 168 hours. Cardiac Enzymes: No results for input(s): CKTOTAL, CKMB, CKMBINDEX, TROPONINI in the last 168 hours. BNP (last 3 results) No results for input(s): PROBNP in the last 8760 hours. HbA1C: Recent Labs    04/02/21 0034  HGBA1C 6.5*   CBG: Recent Labs  Lab 04/03/21 0732 04/03/21 1121 04/03/21 1653 04/03/21 1954 04/04/21 0744  GLUCAP 154* 218* 214* 187* 252*   Lipid  Profile: No results for input(s): CHOL, HDL, LDLCALC, TRIG, CHOLHDL, LDLDIRECT in the last 72 hours. Thyroid Function Tests: No results for input(s): TSH, T4TOTAL, FREET4, T3FREE, THYROIDAB in the last 72 hours. Anemia Panel: No results for input(s): VITAMINB12, FOLATE, FERRITIN, TIBC, IRON, RETICCTPCT in the last 72 hours. Sepsis Labs: Recent Labs  Lab 03/29/21 1100 03/29/21 1217  LATICACIDVEN 1.2 1.4    Recent Results (from the past 240 hour(s))  Resp Panel by RT-PCR (Flu A&B, Covid) Nasopharyngeal Swab     Status: None   Collection Time: 03/29/21 10:38 AM   Specimen: Nasopharyngeal Swab; Nasopharyngeal(NP) swabs in vial transport medium  Result Value Ref Range Status   SARS Coronavirus 2 by RT PCR NEGATIVE NEGATIVE Final    Comment: (NOTE) SARS-CoV-2 target nucleic acids are NOT DETECTED.  The SARS-CoV-2 RNA is generally detectable in upper respiratory specimens during the acute phase of infection. The lowest concentration of SARS-CoV-2 viral copies this assay can detect is 138 copies/mL. A negative result does not preclude SARS-Cov-2 infection and should not be used as the sole basis for  treatment or other patient management decisions. A negative result may occur with  improper specimen collection/handling, submission of specimen other than nasopharyngeal swab, presence of viral mutation(s) within the areas targeted by this assay, and inadequate number of viral copies(<138 copies/mL). A negative result must be combined with clinical observations, patient history, and epidemiological information. The expected result is Negative.  Fact Sheet for Patients:  EntrepreneurPulse.com.au  Fact Sheet for Healthcare Providers:  IncredibleEmployment.be  This test is no t yet approved or cleared by the [place] FDA and  has been authorized for detection and/or diagnosis of SARS-CoV-2 by FDA under an Emergency Use Authorization (EUA). This EUA  will remain  in effect (meaning this test can be used) for the duration of the COVID-19 declaration under Section 564(b)(1) of the Act, 21 U.S.C.section 360bbb-3(b)(1), unless the authorization is terminated  or revoked sooner.       Influenza A by PCR NEGATIVE NEGATIVE Final   Influenza B by PCR NEGATIVE NEGATIVE Final    Comment: (NOTE) The Xpert Xpress SARS-CoV-2/FLU/RSV plus assay is intended as an aid in the diagnosis of influenza from Nasopharyngeal swab specimens and should not be used as a sole basis for treatment. Nasal washings and aspirates are unacceptable for Xpert Xpress SARS-CoV-2/FLU/RSV testing.  Fact Sheet for Patients: EntrepreneurPulse.com.au  Fact Sheet for Healthcare Providers: IncredibleEmployment.be  This test is not yet approved or cleared by the [place] FDA and has been authorized for detection and/or diagnosis of SARS-CoV-2 by FDA under an Emergency Use Authorization (EUA). This EUA will remain in effect (meaning this test can be used) for the duration of the COVID-19 declaration under Section 564(b)(1) of the Act, 21 U.S.C. section 360bbb-3(b)(1), unless the authorization is terminated or revoked.  Performed at [facility], [place] [address] [place], [place] [address]   Blood culture (routine single)     Status: None   Collection Time: 03/29/21 11:35 AM   Specimen: BLOOD LEFT HAND  Result Value Ref Range Status   Specimen Description BLOOD LEFT HAND  Final   Special Requests   Final    BOTTLES DRAWN AEROBIC AND ANAEROBIC Blood Culture adequate volume   Culture   Final    NO GROWTH 5 DAYS Performed at [facility], [place] [address], [place], [place] [address]    Report Status 04/03/2021 FINAL  Final  Culture, blood (single)     Status: None   Collection Time: 03/29/21 11:42 AM   Specimen: BLOOD RIGHT FOREARM  Result Value Ref Range Status   Specimen Description BLOOD RIGHT FOREARM  Final    Special Requests   Final    BOTTLES DRAWN AEROBIC AND ANAEROBIC Blood Culture adequate volume   Culture   Final    NO GROWTH 5 DAYS Performed at [facility], [place] [address], [place], [place] [address]    Report Status 04/03/2021 FINAL  Final  Urine culture     Status: Abnormal   Collection Time: 03/29/21 11:55 AM   Specimen: In/Out Cath Urine  Result Value Ref Range Status   Specimen Description IN/OUT CATH URINE  Final   Special Requests   Final    NONE Performed at [facility], [place] [address] [place], [place] [address]    Culture 2,000 COLONIES/mL STAPHYLOCOCCUS EPIDERMIDIS (A)  Final   Report Status 03/31/2021 FINAL  Final   Organism ID, Bacteria STAPHYLOCOCCUS EPIDERMIDIS (A)  Final      Susceptibility   Staphylococcus epidermidis -  MIC*    CIPROFLOXACIN <=0.5 SENSITIVE Sensitive     GENTAMICIN <=0.5 SENSITIVE Sensitive     NITROFURANTOIN <=16 SENSITIVE Sensitive     OXACILLIN <=0.25 SENSITIVE Sensitive     TETRACYCLINE <=1 SENSITIVE Sensitive     VANCOMYCIN 2 SENSITIVE Sensitive     TRIMETH/SULFA <=10 SENSITIVE Sensitive     CLINDAMYCIN <=0.25 SENSITIVE Sensitive     RIFAMPIN <=0.5 SENSITIVE Sensitive     Inducible Clindamycin NEGATIVE Sensitive     * 2,000 COLONIES/mL STAPHYLOCOCCUS EPIDERMIDIS         Radiology Studies: DG CHEST PORT 1 VIEW  Result Date: 04/04/2021 CLINICAL DATA:  Shortness of breath. EXAM: PORTABLE CHEST 1 VIEW COMPARISON:  03/31/2021.  03/29/2021.  CT 03/29/2021. FINDINGS: Mediastinum heart size stable. Persistent bilateral infiltrates without interim change. Bibasilar atelectasis. Tiny left pleural effusion. No pneumothorax. Oral contrast noted in the stomach and colon. Surgical clips right upper quadrant. IMPRESSION: Persistent bilateral infiltrates without interim change. Bibasilar atelectasis. Tiny left pleural effusion. Electronically Signed   By: Marcello Moores  Register   On: 04/04/2021 05:56        Scheduled Meds:  acidophilus   1 capsule Oral TID   ALPRAZolam  0.5 mg Oral TID   amLODipine  5 mg Oral Daily   budesonide (PULMICORT) nebulizer solution  0.5 mg Nebulization BID   carvedilol  3.125 mg Oral BID WC   FLUoxetine  20 mg Oral Daily   furosemide  40 mg Intravenous Q12H   guaiFENesin  1,200 mg Oral BID   heparin  5,000 Units Subcutaneous Q12H   insulin aspart  0-5 Units Subcutaneous QHS   insulin aspart  0-6 Units Subcutaneous TID WC   ipratropium  2.5 mL Inhalation TID   levalbuterol  1.25 mg Inhalation TID   loratadine  10 mg Oral Daily   methylPREDNISolone (SOLU-MEDROL) injection  60 mg Intravenous Q8H   simvastatin  20 mg Oral QHS   sodium chloride HYPERTONIC  4 mL Nebulization BID   Continuous Infusions:  ceFEPime (MAXIPIME) IV 2 g (04/04/21 1006)     LOS: 6 days    Time spent: 45 minutes spent on chart review, discussion with nursing staff, consultants, updating family and interview/physical exam; more than 50% of that time was spent in counseling and/or coordination of care.    Sevrin Sally J [place] DO [facility] Available via Epic secure chat 7am-7pm After these hours, please refer to coverage provider listed on [email or web address] 04/04/2021, 11:08 AM

## 2021-04-05 ENCOUNTER — Inpatient Hospital Stay (HOSPITAL_COMMUNITY): Payer: Medicare Other

## 2021-04-05 LAB — CBC
HCT: 48.9 % (ref 39.0–52.0)
Hemoglobin: 15.8 g/dL (ref 13.0–17.0)
MCH: 28.6 pg (ref 26.0–34.0)
MCHC: 32.3 g/dL (ref 30.0–36.0)
MCV: 88.6 fL (ref 80.0–100.0)
Platelets: 441 10*3/uL — ABNORMAL HIGH (ref 150–400)
RBC: 5.52 MIL/uL (ref 4.22–5.81)
RDW: 15 % (ref 11.5–15.5)
WBC: 11.8 10*3/uL — ABNORMAL HIGH (ref 4.0–10.5)
nRBC: 0 % (ref 0.0–0.2)

## 2021-04-05 LAB — BASIC METABOLIC PANEL
Anion gap: 8 (ref 5–15)
BUN: 31 mg/dL — ABNORMAL HIGH (ref 8–23)
CO2: 33 mmol/L — ABNORMAL HIGH (ref 22–32)
Calcium: 8.6 mg/dL — ABNORMAL LOW (ref 8.9–10.3)
Chloride: 101 mmol/L (ref 98–111)
Creatinine, Ser: 1.24 mg/dL (ref 0.61–1.24)
GFR, Estimated: >60 mL/min (ref >60)
Glucose, Bld: 182 mg/dL — ABNORMAL HIGH (ref 70–99)
Potassium: 3.5 mmol/L (ref 3.5–5.1)
Sodium: 142 mmol/L (ref 135–145)

## 2021-04-05 LAB — GLUCOSE, CAPILLARY
Glucose-Capillary: 218 mg/dL — ABNORMAL HIGH (ref 70–99)
Glucose-Capillary: 239 mg/dL — ABNORMAL HIGH (ref 70–99)
Glucose-Capillary: 151 mg/dL — ABNORMAL HIGH (ref 70–99)
Glucose-Capillary: 206 mg/dL — ABNORMAL HIGH (ref 70–99)

## 2021-04-05 LAB — MAGNESIUM: Magnesium: 2.3 mg/dL (ref 1.7–2.4)

## 2021-04-05 MED ORDER — METOCLOPRAMIDE HCL 5 MG/ML IJ SOLN
5.0000 mg | Freq: Three times a day (TID) | INTRAMUSCULAR | Status: DC | PRN
  Administered 2021-04-05 – 2021-04-06 (×3): 5 mg via INTRAVENOUS
  Filled 2021-04-05 (×3): qty 2

## 2021-04-05 MED ORDER — ALUM & MAG HYDROXIDE-SIMETH 200-200-20 MG/5ML PO SUSP
30.0000 mL | Freq: Four times a day (QID) | ORAL | Status: DC | PRN
  Administered 2021-04-05 – 2021-04-06 (×3): 30 mL via ORAL
  Filled 2021-04-05 (×4): qty 30

## 2021-04-05 MED ORDER — AMLODIPINE BESYLATE 10 MG PO TABS
10.0000 mg | ORAL_TABLET | Freq: Every day | ORAL | Status: DC
  Administered 2021-04-06 – 2021-04-07 (×2): 10 mg via ORAL
  Filled 2021-04-05 (×2): qty 1

## 2021-04-05 MED ORDER — METOCLOPRAMIDE HCL 5 MG/ML IJ SOLN
5.0000 mg | Freq: Once | INTRAMUSCULAR | Status: AC
  Administered 2021-04-05: 5 mg via INTRAVENOUS
  Filled 2021-04-05: qty 2

## 2021-04-05 MED ORDER — POTASSIUM CHLORIDE CRYS ER 20 MEQ PO TBCR
40.0000 meq | EXTENDED_RELEASE_TABLET | Freq: Once | ORAL | Status: AC
  Administered 2021-04-05: 40 meq via ORAL
  Filled 2021-04-05: qty 2

## 2021-04-05 MED ORDER — PANTOPRAZOLE SODIUM 40 MG PO TBEC
40.0000 mg | DELAYED_RELEASE_TABLET | Freq: Every day | ORAL | Status: DC
  Administered 2021-04-05 – 2021-04-07 (×3): 40 mg via ORAL
  Filled 2021-04-05 (×3): qty 1

## 2021-04-05 MED ORDER — PROCHLORPERAZINE EDISYLATE 10 MG/2ML IJ SOLN
5.0000 mg | Freq: Once | INTRAMUSCULAR | Status: AC
  Administered 2021-04-05: 5 mg via INTRAVENOUS
  Filled 2021-04-05: qty 1

## 2021-04-05 NOTE — Progress Notes (Signed)
TR[facility] night shift.  The nursing staff reported that the patient is vomiting despite receiving ondansetron 4 mg IVP around 2100.  He was admitted for acute respiratory failure with hypoxia in the setting of pneumonia.  He has been having discomfort swallowing and speech therapy has been consulted.  Prochlorperazine 5 mg IVP x1 dose ordered.  Tennis Must, MD.

## 2021-04-05 NOTE — Progress Notes (Signed)
Inpatient Diabetes Program Recommendations  AACE/ADA: New Consensus Statement on Inpatient Glycemic Control (2015)  Target Ranges:  Prepandial:   less than 140 mg/dL      Peak postprandial:   less than 180 mg/dL (1-2 hours)      Critically ill patients:  140 - 180 mg/dL    Review of Glycemic Control Results for ELENA, RUBERG (MRN [id number]) as of 04/05/2021 10:28  Ref. Range 04/04/2021 12:46 04/04/2021 16:10 04/04/2021 21:28 04/05/2021 08:06  Glucose-Capillary Latest Ref Range: 70 - 99 mg/dL 249 (H) 304 (H) 186 (H) 218 (H)   Outpatient Diabetes medications: None Current orders for Inpatient glycemic control: Novolog 0-6 units TID with meals and 0-5 HS   On Solumedrol 40 mg Q8H HgbA1C - 6.5%   Inpatient Diabetes Program Recommendations:     Consider adding Novolog 3 units TID with meals while on steroids   Continue to follow.  Thanks, Bronson Curb, MSN, RNC-OB Diabetes Coordinator [phone] (8a-5p)
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
## 2021-04-05 NOTE — Progress Notes (Signed)
RT NOTES: CPT held at this time d/t pt eating.

## 2021-04-05 NOTE — Progress Notes (Signed)
TRH night shift.  The nursing staff reported that the patient had an episode of emesis and ondansetron has not been effective.  He had 5 mg of metoclopramide several hours ago.  I have ordered another 5 mg of Reglan IVP x1 dose.  Tennis Must, MD

## 2021-04-05 NOTE — Care Management Important Message (Signed)
Important Message  Patient Details  Name: Kristopher [name] MRN: [id number] Date of Birth: 06-16-1946   Medicare Important Message Given:  Yes - Important Message mailed due to current National Emergency   Verbal consent obtained due to current National Emergency  Relationship to patient: Self Contact Name: Aurelio Call Date: 04/05/21  Time: 1014 Phone: [phone] Outcome: Spoke with contact Important Message mailed to: Patient address on file    [place] 04/05/2021, 10:15 AM

## 2021-04-05 NOTE — Progress Notes (Signed)
RT NOTES: CPT held at this time, patient sleeping.
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
## 2021-04-05 NOTE — Progress Notes (Signed)
SLP Cancellation Note  Patient Details Name: GURANSH GREELEY MRN: [id number] DOB: 1946-03-03   Cancelled treatment:       Reason Eval/Treat Not Completed: Other (comment) Visited pt brielfy at bedside. He is still tolerating modified diet and using precautions. No new signs of aspiration. He vomited overnight, but doesn't appear directly related to swallowing problems. Pt to continue diet unless MD orders otherwise. Reported to MD.    Lynann Beaver 04/05/2021, 9:45 AM

## 2021-04-06 LAB — GLUCOSE, CAPILLARY
Glucose-Capillary: 139 mg/dL — ABNORMAL HIGH (ref 70–99)
Glucose-Capillary: 197 mg/dL — ABNORMAL HIGH (ref 70–99)
Glucose-Capillary: 155 mg/dL — ABNORMAL HIGH (ref 70–99)
Glucose-Capillary: 236 mg/dL — ABNORMAL HIGH (ref 70–99)

## 2021-04-06 LAB — CBC
HCT: 50.5 % (ref 39.0–52.0)
Hemoglobin: 16.6 g/dL (ref 13.0–17.0)
MCH: 28.8 pg (ref 26.0–34.0)
MCHC: 32.9 g/dL (ref 30.0–36.0)
MCV: 87.5 fL (ref 80.0–100.0)
Platelets: 460 10*3/uL — ABNORMAL HIGH (ref 150–400)
RBC: 5.77 MIL/uL (ref 4.22–5.81)
RDW: 14.8 % (ref 11.5–15.5)
WBC: 18.4 10*3/uL — ABNORMAL HIGH (ref 4.0–10.5)
nRBC: 0 % (ref 0.0–0.2)

## 2021-04-06 LAB — BASIC METABOLIC PANEL
Anion gap: 9 (ref 5–15)
BUN: 35 mg/dL — ABNORMAL HIGH (ref 8–23)
CO2: 36 mmol/L — ABNORMAL HIGH (ref 22–32)
Calcium: 8.7 mg/dL — ABNORMAL LOW (ref 8.9–10.3)
Chloride: 96 mmol/L — ABNORMAL LOW (ref 98–111)
Creatinine, Ser: 1.31 mg/dL — ABNORMAL HIGH (ref 0.61–1.24)
GFR, Estimated: 57 mL/min — ABNORMAL LOW (ref >60)
Glucose, Bld: 153 mg/dL — ABNORMAL HIGH (ref 70–99)
Potassium: 3.8 mmol/L (ref 3.5–5.1)
Sodium: 141 mmol/L (ref 135–145)

## 2021-04-06 LAB — MAGNESIUM: Magnesium: 2.4 mg/dL (ref 1.7–2.4)

## 2021-04-06 MED ORDER — FUROSEMIDE 10 MG/ML IJ SOLN
40.0000 mg | Freq: Every day | INTRAMUSCULAR | Status: DC
  Administered 2021-04-06 – 2021-04-07 (×2): 40 mg via INTRAVENOUS
  Filled 2021-04-06 (×2): qty 4

## 2021-04-06 MED ORDER — METHYLPREDNISOLONE SODIUM SUCC 40 MG IJ SOLR
40.0000 mg | Freq: Two times a day (BID) | INTRAMUSCULAR | Status: DC
  Administered 2021-04-06 – 2021-04-07 (×2): 40 mg via INTRAVENOUS
  Filled 2021-04-06 (×3): qty 1

## 2021-04-06 NOTE — Progress Notes (Signed)
Kristopher NOTE    LOGON OLSHEFSKI  [id number] DOB: 1946-09-25 DOA: 03/29/2021 PCP: Susy Frizzle, Kristopher    Brief Narrative:  Kristopher [name] is a 75 year old male with past medical Kristopher significant for essential [name], Kristopher [name], Kristopher [name], Kristopher [name], Kristopher left nephrectomy and cholecystectomy, Hx Covid pneumonia 2020, tobacco use disorder who presented with fever, worsening [name] and shortness of breath.  [name] is productive of yellow/green sputum.  Patient reports symptoms of malaise, decreased appetite over the last 3-4 days and daughter noticed patient had increased productive [name] with associated nausea, vomiting.  Over the last 2 days, patient has not been able to eat or drink anything.  He also endorses that sometimes he feels "food stuck in his throat", and daughter reports that he sometimes coughs after eating or drinking.  Patient denies Kristopher of COPD, but long Kristopher of tobacco use disorder, CT scan 2014 shows signs of emphysema but patient never followed up with pulmonology.  Patient reportedly has been using her his daughter COPD medication including albuterol MDI for his coughing chronically.  Patient not followed up with any physician over the last 2 years.  On EMS arrival, patient was noted to have O2 saturation in the 70s and he was initially placed on CPAP.  In the ED, WBC 16.9, bilirubin 2.1, AST 76, creatinine 1.6.  Chest x-ray notable for bilateral infiltrates concerning for pneumonia.  Hospital service consulted for further evaluation and treatment of acute hypoxic respiratory [name] secondary to pneumonia and COPD exacerbation.   Assessment & Plan:   Principal Problem:   Acute respiratory [name] (HCC) Active Problems:   Dyslipidemia   Essential [name]   Acute kidney injury ([place])   Community acquired pneumonia   Sepsis ([place])   GAD (generalized anxiety  disorder)   Dysphagia   Acute hypoxic respiratory [name], POA Patient presenting with productive [name] with green/yellow sputum in the setting of longstanding Kristopher of tobacco use disorder.  Patient was found to be hypoxic by EMS with SPO2 in the 70s initially requiring CPAP.  Etiology likely secondary to pneumonia and COPD exacerbation.  Continue treatment as below.  Sepsis, POA Community-acquired versus aspiration pneumonia Patient presenting with progressive shortness of breath associated with yellow/green sputum.  Patient was notably dyspneic and hypoxic on arrival.  CT chest with airspace opacities noted in the lingula, bilateral lobes with bronchial plugging consistent with multifocal infection versus aspiration.  Completed 5-day course of azithromycin and 7-day course of cefepime. --WBC 16.9>13.7>9.8>13.4>11.2>11.0>11.8>18.4 --Chest physiotherapy --Mucinex --Continue supplemental oxygen, maintain SPO2 greater than 88%; currently on HHFNC w/ FiO2 100% and 40L --Flutter valve/incentive spirometry --Chest physiotherapy --Bipap prn and QHS  COPD exacerbation Patient with longstanding Kristopher of tobacco use disorder.  Has been utilizing his daughter's albuterol MDI recently.  No formal diagnosis but imaging notable for emphysematous changes in the lungs. --Xopenex neb every 4 hours --Atrovent nebs every 4 hours --Decrease to Solumedrol '40mg'$  IV q12h --Pulmicort nebs twice daily --Mucinex 1200 mg PO BID --Continue supplemental oxygen as above  Acute renal [name] Creatinine 1.63 on admission.  Etiology likely prerenal azotemia versus ATN secondary to sepsis/pneumonia as above. --Cr 1.63>1.44>1.30>1.27>1.25>1.24>1.22>1.24>1.31 --Holding home furosemide --Avoid nephrotoxins, renally dose all medications --Repeat BMP in the a.m.  Nausea/vomiting: Overnight, episode of nausea and vomiting.   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  TTE with LVEF now improved to 60-65% (was 40% in 2014) with grade 1 diastolic dysfunction and trivial MR. BNP elevated 691.1. --net negative 936m past 24 hours and net negative 2.0L since admission --Continue carvedilol 3.125 mg p.o. twice daily --Furosemide 40 mg IV q24h --Strict I's and O's and daily weights --BMP daily  T2DM Hemoglobin A1c 6.5. --SSI for coverage --CBGs qAC/HS  Hypokalemia Potassium 3.5, will replete. --Repeat electrolytes in a.m. to include magnesium  Dysphagia Patient reports sometimes feeling food getting stuck in his throat, and daughter reports that he sometimes coughs after eating/drinking.  Speech therapy was consulted and has followed during hospital course.  Underwent MBS on 04/02/2021 with mild aspiration event caused by prolonged coughing; symptoms related primarily to respiratory impairments and with larger consecutive sips of thin liquids on consecutive swallows. --Speech therapy following, appreciate assistance --Soft diet with nectar thick liquids --Aspiration precautions, needs to sit in a full upright position and slow swallowing while eating  Essential [name] BP 140/69 this morning. --Carvedilol 3.125 mg p.o. twice daily --Amlodipine 10 mg p.o. daily today --Hydralazine 25 mg p.o. every 8 hours prn SBP >180 or DBP >110 --Continue monitor BP and adjust antihypertensives as needed  Generalized anxiety disorder --Prozac 20 mg p.o. daily --Xanax 0.'5mg'$  PO TID prn  GERD: Gaseous distention --Protonix 40 mg p.o. daily --Maalox/Mylanta as needed  HLD: Zocor 20 mg p.o. daily    DVT prophylaxis: heparin injection 5,000 Units Start: 03/29/21 1500   Code Status: Full Code Family Communication: updated patients daughter, LJeani Hawkingvia telephone this morning   Disposition Plan:  Level of care: Telemetry Medical Status is: Inpatient  Remains inpatient appropriate because:Ongoing diagnostic testing needed not appropriate for outpatient work up, Unsafe d/c plan, IV treatments appropriate due to intensity of illness or inability to take PO, and Inpatient level of care appropriate due to severity of illness  Dispo: The patient is from: Home              Anticipated d/c is to: Home              Patient currently is not medically stable to d/c.   Difficult to place patient No   Consultants:  none  Procedures:  TTE MBS  Antimicrobials:  Azithromycin 6/17 - 6/22 Cefepime 6/17 - 6/24 Ceftriaxone 6/17 - 6/17   Subjective: Patient seen examined bedside, resting comfortably.  Patient reported small episode of emesis this morning, improved with Reglan.  He reports he utilized BiPAP overnight and tolerated.  He feels like his dyspnea improved slowly each day.  No family present at bedside this morning.  Remains on heated high flow nasal cannula with 40 L/FiO2 100%.  No other questions or concerns at this time. Denies headache, no fever/chills/night sweats, no diarrhea, no chest pain, no palpitations, no abdominal pain, no fatigue, no paresthesias.  No other acute events overnight per nursing staff.  Objective: Vitals:   04/05/21 2004 04/05/21 2111 04/06/21 0443 04/06/21 0730  BP:  (!) 158/76 140/69   Pulse:  93 82   Resp:      Temp:  97.6 F (36.4 C) 97.9 F (36.6 C)   TempSrc:  Oral Oral   SpO2: 93%   94%  Weight:   68.8 kg   Height:        Intake/Output Summary (Last 24 hours) at 04/06/2021 1132 Last data filed at 04/06/2021 1106 Gross per 24 hour  Intake  154.78 ml  Output 1200 ml  Net -1045.22 ml   Filed Weights   04/02/21 1857 04/05/21 0500 04/06/21 0443  Weight: 71 kg 70.5 kg 68.8 kg    Examination:  General exam: Calm/comfortable, heated high flow nasal cannula noted, appears older than stated age and chronically ill in  appearance Respiratory system: Coarse breath sounds bilaterally with mid expiratory wheezing throughout all lung fields, no crackles/rhonchi, no accessory muscle use, on HHFNC w/ FiO2 100% and 40L Cardiovascular system: S1 & S2 heard, RRR. No JVD, murmurs, rubs, gallops or clicks. No pedal edema. Gastrointestinal system: Abdomen is nondistended, soft and nontender. No organomegaly or masses felt. Normal bowel sounds heard. Central nervous system: Alert and oriented. No focal neurological deficits. Extremities: Symmetric 5 x 5 power. Skin: No rashes, lesions or ulcers Psychiatry: Judgement and insight appear poor.  Mood & affect appropriate.     Data Reviewed: I have personally reviewed following labs and imaging studies  CBC: Recent Labs  Lab 04/02/21 0034 04/03/21 0037 04/04/21 0137 04/05/21 0043 04/06/21 0254  WBC 13.4* 11.2* 11.0* 11.8* 18.4*  HGB 14.7 14.4 15.3 15.8 16.6  HCT 45.1 43.9 45.9 48.9 50.5  MCV 88.8 88.2 87.4 88.6 87.5  PLT 445* 436* 471* 441* 123456*   Basic Metabolic Panel: Recent Labs  Lab 04/02/21 0034 04/03/21 0037 04/04/21 0137 04/05/21 0043 04/06/21 0254  NA 139 142 143 142 141  K 4.2 3.5 3.4* 3.5 3.8  CL 108 106 106 101 96*  CO2 '25 30 31 '$ 33* 36*  GLUCOSE 243* 208* 188* 182* 153*  BUN 24* 23 22 31* 35*  CREATININE 1.25* 1.24 1.22 1.24 1.31*  CALCIUM 8.6* 8.8* 8.9 8.6* 8.7*  MG  --   --   --  2.3 2.4   GFR: Estimated Creatinine Clearance: 47.4 mL/min (A) (by C-G formula based on SCr of 1.31 mg/dL (H)). Liver Function Tests: No results for input(s): AST, ALT, ALKPHOS, BILITOT, PROT, ALBUMIN in the last 168 hours.  No results for input(s): LIPASE, AMYLASE in the last 168 hours.  No results for input(s): AMMONIA in the last 168 hours. Coagulation Profile: No results for input(s): INR, PROTIME in the last 168 hours. Cardiac Enzymes: No results for input(s): CKTOTAL, CKMB, CKMBINDEX, TROPONINI in the last 168 hours. BNP (last 3 results) No  results for input(s): PROBNP in the last 8760 hours. HbA1C: No results for input(s): HGBA1C in the last 72 hours.  CBG: Recent Labs  Lab 04/05/21 0806 04/05/21 1214 04/05/21 1654 04/05/21 2108 04/06/21 0752  GLUCAP 218* 239* 151* 206* 139*   Lipid Profile: No results for input(s): CHOL, HDL, LDLCALC, TRIG, CHOLHDL, LDLDIRECT in the last 72 hours. Thyroid Function Tests: No results for input(s): TSH, T4TOTAL, FREET4, T3FREE, THYROIDAB in the last 72 hours. Anemia Panel: No results for input(s): VITAMINB12, FOLATE, FERRITIN, TIBC, IRON, RETICCTPCT in the last 72 hours. Sepsis Labs: No results for input(s): PROCALCITON, LATICACIDVEN in the last 168 hours.   Recent Results (from the past 240 hour(s))  Resp Panel by RT-PCR (Flu A&B, Covid) Nasopharyngeal Swab     Status: None   Collection Time: 03/29/21 10:38 AM   Specimen: Nasopharyngeal Swab; Nasopharyngeal(NP) swabs in vial transport medium  Result Value Ref Range Status   SARS Coronavirus 2 by RT PCR NEGATIVE NEGATIVE Final    Comment: (NOTE) SARS-CoV-2 target nucleic acids are NOT DETECTED.  The SARS-CoV-2 RNA is generally detectable in upper respiratory specimens during the acute phase of infection. The lowest concentration of SARS-CoV-2  viral copies this assay can detect is 138 copies/mL. A negative result does not preclude SARS-Cov-2 infection and should not be used as the sole basis for treatment or other patient management decisions. A negative result may occur with  improper specimen collection/handling, submission of specimen other than nasopharyngeal swab, presence of viral mutation(s) within the areas targeted by this assay, and inadequate number of viral copies(<138 copies/mL). A negative result must be combined with clinical observations, patient Kristopher, and epidemiological information. The expected result is Negative.  Fact Sheet for Patients:  EntrepreneurPulse.com.au  Fact Sheet for  Healthcare Providers:  IncredibleEmployment.be  This test is no t yet approved or cleared by the [place] FDA and  has been authorized for detection and/or diagnosis of SARS-CoV-2 by FDA under an Emergency Use Authorization (EUA). This EUA will remain  in effect (meaning this test can be used) for the duration of the COVID-19 declaration under Section 564(b)(1) of the Act, 21 U.S.C.section 360bbb-3(b)(1), unless the authorization is terminated  or revoked sooner.       Influenza A by PCR NEGATIVE NEGATIVE Final   Influenza B by PCR NEGATIVE NEGATIVE Final    Comment: (NOTE) The Xpert Xpress SARS-CoV-2/FLU/RSV plus assay is intended as an aid in the diagnosis of influenza from Nasopharyngeal swab specimens and should not be used as a sole basis for treatment. Nasal washings and aspirates are unacceptable for Xpert Xpress SARS-CoV-2/FLU/RSV testing.  Fact Sheet for Patients: EntrepreneurPulse.com.au  Fact Sheet for Healthcare Providers: IncredibleEmployment.be  This test is not yet approved or cleared by the [place] FDA and has been authorized for detection and/or diagnosis of SARS-CoV-2 by FDA under an Emergency Use Authorization (EUA). This EUA will remain in effect (meaning this test can be used) for the duration of the COVID-19 declaration under Section 564(b)(1) of the Act, 21 U.S.C. section 360bbb-3(b)(1), unless the authorization is terminated or revoked.  Performed at [facility], [place] [address] [place], [place] [address]   Blood culture (routine single)     Status: None   Collection Time: 03/29/21 11:35 AM   Specimen: BLOOD LEFT HAND  Result Value Ref Range Status   Specimen Description BLOOD LEFT HAND  Final   Special Requests   Final    BOTTLES DRAWN AEROBIC AND ANAEROBIC Blood Culture adequate volume   Culture   Final    NO GROWTH 5 DAYS Performed at [facility], [place] [address] [place], [place] [address]    Report Status 04/03/2021 FINAL  Final  Culture, blood (single)     Status: None   Collection Time: 03/29/21 11:42 AM   Specimen: BLOOD RIGHT FOREARM  Result Value Ref Range Status   Specimen Description BLOOD RIGHT FOREARM  Final   Special Requests   Final    BOTTLES DRAWN AEROBIC AND ANAEROBIC Blood Culture adequate volume   Culture   Final    NO GROWTH 5 DAYS Performed at [facility], [place] [address], [place], [place] [employer] [address]    Report Status 04/03/2021 FINAL  Final  Urine culture     Status: Abnormal   Collection Time: 03/29/21 11:55 AM   Specimen: In/Out Cath Urine  Result Value Ref Range Status   Specimen Description IN/OUT CATH URINE  Final   Special Requests   Final    NONE Performed at [facility], [place] [address], [place], [place] [address]    Culture 2,000 COLONIES/mL STAPHYLOCOCCUS EPIDERMIDIS (A)  Final  Report Status 03/31/2021 FINAL  Final   Organism ID, Bacteria STAPHYLOCOCCUS EPIDERMIDIS (A)  Final      Susceptibility   Staphylococcus epidermidis - MIC*    CIPROFLOXACIN <=0.5 SENSITIVE Sensitive     GENTAMICIN <=0.5 SENSITIVE Sensitive     NITROFURANTOIN <=16 SENSITIVE Sensitive     OXACILLIN <=0.25 SENSITIVE Sensitive     TETRACYCLINE <=1 SENSITIVE Sensitive     VANCOMYCIN 2 SENSITIVE Sensitive     TRIMETH/SULFA <=10 SENSITIVE Sensitive     CLINDAMYCIN <=0.25 SENSITIVE Sensitive     RIFAMPIN <=0.5 SENSITIVE Sensitive     Inducible Clindamycin NEGATIVE Sensitive     * 2,000 COLONIES/mL STAPHYLOCOCCUS EPIDERMIDIS         Radiology Studies: DG Abd Portable 1V  Result Date: 04/05/2021 CLINICAL DATA:  Abdominal distension EXAM: PORTABLE ABDOMEN - 1 VIEW COMPARISON:  Portable exam 1220 hours compared to 08/20/2012 FINDINGS: Retained contrast throughout colon as well as some retained contrast in stomach. Scattered sigmoid diverticula. No bowel dilatation or bowel wall thickening. Bones demineralized. Scattered  surgical clips in the upper abdomen bilaterally. Persistent atelectasis versus infiltrate and or effusion at LEFT lung base. IMPRESSION: Nonobstructive bowel gas pattern. Sigmoid diverticulosis Electronically Signed   By: Lavonia Dana M.D.   On: 04/05/2021 14:20        Scheduled Meds:  acidophilus  1 capsule Oral TID   ALPRAZolam  0.5 mg Oral TID   amLODipine  10 mg Oral Daily   budesonide (PULMICORT) nebulizer solution  0.5 mg Nebulization BID   carvedilol  3.125 mg Oral BID WC   FLUoxetine  20 mg Oral Daily   furosemide  40 mg Intravenous Daily   guaiFENesin  1,200 mg Oral BID   heparin  5,000 Units Subcutaneous Q12H   insulin aspart  0-5 Units Subcutaneous QHS   insulin aspart  0-6 Units Subcutaneous TID WC   ipratropium  2.5 mL Inhalation TID   levalbuterol  1.25 mg Inhalation TID   loratadine  10 mg Oral Daily   methylPREDNISolone (SOLU-MEDROL) injection  40 mg Intravenous Q8H   pantoprazole  40 mg Oral Daily   simvastatin  20 mg Oral QHS   Continuous Infusions:     LOS: 8 days    Time spent: 39 minutes spent on chart review, discussion with nursing staff, consultants, updating family and interview/physical exam; more than 50% of that time was spent in counseling and/or coordination of care.    Briana Farner J [place] DO [facility] Available via Epic secure chat 7am-7pm After these hours, please refer to coverage provider listed on [email or web address] 04/06/2021, 11:32 AM

## 2021-04-07 ENCOUNTER — Inpatient Hospital Stay (HOSPITAL_COMMUNITY): Payer: Medicare Other

## 2021-04-07 LAB — CBC
HCT: 49.8 % (ref 39.0–52.0)
Hemoglobin: 15.9 g/dL (ref 13.0–17.0)
MCH: 28.1 pg (ref 26.0–34.0)
MCHC: 31.9 g/dL (ref 30.0–36.0)
MCV: 88.1 fL (ref 80.0–100.0)
Platelets: 421 10*3/uL — ABNORMAL HIGH (ref 150–400)
RBC: 5.65 MIL/uL (ref 4.22–5.81)
RDW: 14.7 % (ref 11.5–15.5)
WBC: 20.6 10*3/uL — ABNORMAL HIGH (ref 4.0–10.5)
nRBC: 0.1 % (ref 0.0–0.2)

## 2021-04-07 LAB — CBC WITH DIFFERENTIAL/PLATELET
Abs Immature Granulocytes: 0.31 10*3/uL — ABNORMAL HIGH (ref 0.00–0.07)
Basophils Absolute: 0.1 10*3/uL (ref 0.0–0.1)
Basophils Relative: 0 %
Eosinophils Absolute: 0 10*3/uL (ref 0.0–0.5)
Eosinophils Relative: 0 %
HCT: 52.5 % — ABNORMAL HIGH (ref 39.0–52.0)
Hemoglobin: 17.3 g/dL — ABNORMAL HIGH (ref 13.0–17.0)
Immature Granulocytes: 1 %
Lymphocytes Relative: 2 %
Lymphs Abs: 0.6 10*3/uL — ABNORMAL LOW (ref 0.7–4.0)
MCH: 29 pg (ref 26.0–34.0)
MCHC: 33 g/dL (ref 30.0–36.0)
MCV: 88.1 fL (ref 80.0–100.0)
Monocytes Absolute: 1.9 10*3/uL — ABNORMAL HIGH (ref 0.1–1.0)
Monocytes Relative: 7 %
Neutro Abs: 24.4 10*3/uL — ABNORMAL HIGH (ref 1.7–7.7)
Neutrophils Relative %: 90 %
Platelets: 395 10*3/uL (ref 150–400)
RBC: 5.96 MIL/uL — ABNORMAL HIGH (ref 4.22–5.81)
RDW: 14.9 % (ref 11.5–15.5)
WBC: 27.2 10*3/uL — ABNORMAL HIGH (ref 4.0–10.5)
nRBC: 0.1 % (ref 0.0–0.2)

## 2021-04-07 LAB — COMPREHENSIVE METABOLIC PANEL
ALT: 43 U/L (ref 0–44)
AST: 33 U/L (ref 15–41)
Albumin: 2.7 g/dL — ABNORMAL LOW (ref 3.5–5.0)
Alkaline Phosphatase: 77 U/L (ref 38–126)
Anion gap: 10 (ref 5–15)
BUN: 45 mg/dL — ABNORMAL HIGH (ref 8–23)
CO2: 34 mmol/L — ABNORMAL HIGH (ref 22–32)
Calcium: 8.5 mg/dL — ABNORMAL LOW (ref 8.9–10.3)
Chloride: 95 mmol/L — ABNORMAL LOW (ref 98–111)
Creatinine, Ser: 1.37 mg/dL — ABNORMAL HIGH (ref 0.61–1.24)
GFR, Estimated: 54 mL/min — ABNORMAL LOW (ref >60)
Glucose, Bld: 202 mg/dL — ABNORMAL HIGH (ref 70–99)
Potassium: 5.2 mmol/L — ABNORMAL HIGH (ref 3.5–5.1)
Sodium: 139 mmol/L (ref 135–145)
Total Bilirubin: 1.4 mg/dL — ABNORMAL HIGH (ref 0.3–1.2)
Total Protein: 6.6 g/dL (ref 6.5–8.1)

## 2021-04-07 LAB — MAGNESIUM: Magnesium: 3 mg/dL — ABNORMAL HIGH (ref 1.7–2.4)

## 2021-04-07 LAB — BASIC METABOLIC PANEL
Anion gap: 7 (ref 5–15)
BUN: 40 mg/dL — ABNORMAL HIGH (ref 8–23)
CO2: 39 mmol/L — ABNORMAL HIGH (ref 22–32)
Calcium: 8.6 mg/dL — ABNORMAL LOW (ref 8.9–10.3)
Chloride: 94 mmol/L — ABNORMAL LOW (ref 98–111)
Creatinine, Ser: 1.27 mg/dL — ABNORMAL HIGH (ref 0.61–1.24)
GFR, Estimated: 59 mL/min — ABNORMAL LOW (ref >60)
Glucose, Bld: 143 mg/dL — ABNORMAL HIGH (ref 70–99)
Potassium: 4 mmol/L (ref 3.5–5.1)
Sodium: 140 mmol/L (ref 135–145)

## 2021-04-07 LAB — POCT I-STAT 7, (LYTES, BLD GAS, ICA,H+H)
Acid-Base Excess: 15 mmol/L — ABNORMAL HIGH (ref 0.0–2.0)
Bicarbonate: 45.5 mmol/L — ABNORMAL HIGH (ref 20.0–28.0)
Calcium, Ion: 1.13 mmol/L — ABNORMAL LOW (ref 1.15–1.40)
HCT: 53 % — ABNORMAL HIGH (ref 39.0–52.0)
Hemoglobin: 18 g/dL — ABNORMAL HIGH (ref 13.0–17.0)
O2 Saturation: 87 %
Potassium: 4.4 mmol/L (ref 3.5–5.1)
Sodium: 140 mmol/L (ref 135–145)
TCO2: 48 mmol/L — ABNORMAL HIGH (ref 22–32)
pCO2 arterial: 79.3 mmHg (ref 32.0–48.0)
pH, Arterial: 7.367 (ref 7.350–7.450)
pO2, Arterial: 59 mmHg — ABNORMAL LOW (ref 83.0–108.0)

## 2021-04-07 LAB — GLUCOSE, CAPILLARY
Glucose-Capillary: 145 mg/dL — ABNORMAL HIGH (ref 70–99)
Glucose-Capillary: 162 mg/dL — ABNORMAL HIGH (ref 70–99)
Glucose-Capillary: 184 mg/dL — ABNORMAL HIGH (ref 70–99)
Glucose-Capillary: 195 mg/dL — ABNORMAL HIGH (ref 70–99)
Glucose-Capillary: 252 mg/dL — ABNORMAL HIGH (ref 70–99)

## 2021-04-07 LAB — MRSA NEXT GEN BY PCR, NASAL: MRSA by PCR Next Gen: NOT DETECTED

## 2021-04-07 MED ORDER — NOREPINEPHRINE 4 MG/250ML-% IV SOLN
2.0000 ug/min | INTRAVENOUS | Status: DC
  Filled 2021-04-07 (×2): qty 250

## 2021-04-07 MED ORDER — ORAL CARE MOUTH RINSE
15.0000 mL | OROMUCOSAL | Status: DC
  Administered 2021-04-07 – 2021-04-17 (×98): 15 mL via OROMUCOSAL

## 2021-04-07 MED ORDER — FENTANYL CITRATE (PF) 100 MCG/2ML IJ SOLN
INTRAMUSCULAR | Status: AC
  Administered 2021-04-07: 75 ug via INTRAVENOUS
  Filled 2021-04-07: qty 2

## 2021-04-07 MED ORDER — METHYLPREDNISOLONE SODIUM SUCC 40 MG IJ SOLR
40.0000 mg | INTRAMUSCULAR | Status: DC
  Administered 2021-04-08 – 2021-04-10 (×3): 40 mg via INTRAVENOUS
  Filled 2021-04-07 (×3): qty 1

## 2021-04-07 MED ORDER — ROCURONIUM BROMIDE 10 MG/ML (PF) SYRINGE: 80.0000 mg | PREFILLED_SYRINGE | Freq: Once | INTRAVENOUS | Status: AC

## 2021-04-07 MED ORDER — ROCURONIUM BROMIDE 10 MG/ML (PF) SYRINGE
PREFILLED_SYRINGE | INTRAVENOUS | Status: AC
  Administered 2021-04-07: 80 mg via INTRAVENOUS
  Filled 2021-04-07: qty 10

## 2021-04-07 MED ORDER — VANCOMYCIN HCL 1500 MG/300ML IV SOLN
1500.0000 mg | Freq: Once | INTRAVENOUS | Status: AC
  Administered 2021-04-08: 1500 mg via INTRAVENOUS
  Filled 2021-04-07: qty 300

## 2021-04-07 MED ORDER — PHENOL 1.4 % MT LIQD
1.0000 | OROMUCOSAL | Status: DC | PRN
  Administered 2021-04-07: 1 via OROMUCOSAL
  Filled 2021-04-07: qty 177

## 2021-04-07 MED ORDER — SODIUM CHLORIDE 0.9 % IV BOLUS
1000.0000 mL | Freq: Once | INTRAVENOUS | Status: AC
  Administered 2021-04-07: 1000 mL via INTRAVENOUS

## 2021-04-07 MED ORDER — CHLORHEXIDINE GLUCONATE CLOTH 2 % EX PADS
6.0000 | MEDICATED_PAD | Freq: Every day | CUTANEOUS | Status: DC
  Administered 2021-04-07 – 2021-04-16 (×11): 6 via TOPICAL

## 2021-04-07 MED ORDER — ETOMIDATE 2 MG/ML IV SOLN
INTRAVENOUS | Status: AC
  Administered 2021-04-07: 20 mg via INTRAVENOUS
  Filled 2021-04-07: qty 20

## 2021-04-07 MED ORDER — SODIUM CHLORIDE 0.9 % IV SOLN
250.0000 mL | INTRAVENOUS | Status: DC
  Administered 2021-04-07 – 2021-04-14 (×5): 250 mL via INTRAVENOUS

## 2021-04-07 MED ORDER — FAMOTIDINE IN NACL 20-0.9 MG/50ML-% IV SOLN
20.0000 mg | INTRAVENOUS | Status: DC
  Administered 2021-04-07: 20 mg via INTRAVENOUS
  Filled 2021-04-07: qty 50

## 2021-04-07 MED ORDER — ETOMIDATE 2 MG/ML IV SOLN: 20.0000 mg | Freq: Once | INTRAVENOUS | Status: AC

## 2021-04-07 MED ORDER — PIPERACILLIN-TAZOBACTAM 3.375 G IVPB
3.3750 g | Freq: Three times a day (TID) | INTRAVENOUS | Status: DC
  Administered 2021-04-07 – 2021-04-08 (×2): 3.375 g via INTRAVENOUS
  Filled 2021-04-07 (×3): qty 50

## 2021-04-07 MED ORDER — MIDAZOLAM HCL 2 MG/2ML IJ SOLN
INTRAMUSCULAR | Status: AC
  Filled 2021-04-07: qty 2

## 2021-04-07 MED ORDER — ARFORMOTEROL TARTRATE 15 MCG/2ML IN NEBU
15.0000 ug | INHALATION_SOLUTION | Freq: Two times a day (BID) | RESPIRATORY_TRACT | Status: DC
  Administered 2021-04-07 – 2021-04-17 (×21): 15 ug via RESPIRATORY_TRACT
  Filled 2021-04-07 (×22): qty 2

## 2021-04-07 MED ORDER — FENTANYL 2500MCG IN NS 250ML (10MCG/ML) PREMIX INFUSION
0.0000 ug/h | INTRAVENOUS | Status: DC
  Administered 2021-04-07: 25 ug/h via INTRAVENOUS
  Administered 2021-04-08: 125 ug/h via INTRAVENOUS
  Administered 2021-04-09: 225 ug/h via INTRAVENOUS
  Administered 2021-04-09: 190 ug/h via INTRAVENOUS
  Administered 2021-04-09: 225 ug/h via INTRAVENOUS
  Administered 2021-04-10: 200 ug/h via INTRAVENOUS
  Administered 2021-04-10: 275 ug/h via INTRAVENOUS
  Administered 2021-04-11: 225 ug/h via INTRAVENOUS
  Administered 2021-04-11: 150 ug/h via INTRAVENOUS
  Administered 2021-04-12: 200 ug/h via INTRAVENOUS
  Administered 2021-04-12: 225 ug/h via INTRAVENOUS
  Administered 2021-04-13: 275 ug/h via INTRAVENOUS
  Administered 2021-04-13: 225 ug/h via INTRAVENOUS
  Administered 2021-04-13: 300 ug/h via INTRAVENOUS
  Administered 2021-04-14 (×2): 200 ug/h via INTRAVENOUS
  Administered 2021-04-15 (×2): 250 ug/h via INTRAVENOUS
  Administered 2021-04-15: 275 ug/h via INTRAVENOUS
  Administered 2021-04-16 (×2): 300 ug/h via INTRAVENOUS
  Administered 2021-04-17 (×2): 250 ug/h via INTRAVENOUS
  Administered 2021-04-17: 300 ug/h via INTRAVENOUS
  Filled 2021-04-07 (×23): qty 250

## 2021-04-07 MED ORDER — VANCOMYCIN HCL 1000 MG/200ML IV SOLN: 1000.0000 mg | INTRAVENOUS | Status: DC

## 2021-04-07 MED ORDER — CHLORHEXIDINE GLUCONATE 0.12% ORAL RINSE (MEDLINE KIT)
15.0000 mL | Freq: Two times a day (BID) | OROMUCOSAL | Status: DC
  Administered 2021-04-08 – 2021-04-17 (×21): 15 mL via OROMUCOSAL

## 2021-04-07 MED ORDER — FENTANYL CITRATE (PF) 100 MCG/2ML IJ SOLN: 75.0000 ug | Freq: Once | INTRAMUSCULAR | Status: AC

## 2021-04-07 NOTE — Significant Event (Addendum)
Rapid Response Event Note   Reason for Call :  Pt aspirated watermelon  Initial Focused Assessment:  Pt lying in bed, gray and dusky, trying to cough, SpO2-77%, HR-95. Cough too weak to successfully cough anything up. Pt unable to speak.  Pt on [place] 40L 100%. RT at bedside as well and NTS'd  pt multiple times with a few small pieces of watermelon able to be suctioned out. Despite this, SpO2 still only fluctuated between 77% and 85%. Pt placed on NRB over [place]. PCCM Hoffman/ Izquierdo and Dr. Olevia Bowens with [facility] called emergently to bedside and pt set up for emergent intubation/bronch. Pt given 67mg fentanyl, '20mg'$  etomidate, and '80mg'$  roc prior to intubation. [place] intub[place] immediately [place]. Additional 521m fentanyl given to pt at 1924. Pt transferred to 2M05 with Dr. OrRolena InfanteRRT, and myself.   VS prior to intubation: HR-95, SpO2-77% on 40L 100% HHFNC  VS after intubation: HR-94, BP-169/84, RR-32, SpO2-85% with 100% BVM via ETT.  Interventions:  NTS PCCM consulted Emergent intubation/bronch Pt tx to 2M05  Plan of Care:  Tx to 2M05   Event Summary:   MD Notified: Dr. OrOlevia Bowensnd PCCM Izquierdo/Hoffman both notified and came to bedside  Call TiBrisbaneHoDillard EssexRN

## 2021-04-07 NOTE — Procedures (Signed)
Intubation Procedure Note  Kristopher [name]  [id number]  1946-06-17  Date:04/07/21  Time:7:34 PM   Provider Performing:Betsey Sossamon E Orpah Melter    Procedure: Intubation ([id number])  Indication(s) Respiratory Failure  Consent Unable to obtain consent due to emergent nature of procedure.   Anesthesia Etomidate and Rocuronium   Time Out Verified patient identification, verified procedure, site/side was marked, verified correct patient position, special equipment/implants available, medications/allergies/relevant history reviewed, required imaging and test results available.   Sterile Technique Usual hand hygeine, masks, and gloves were used   Procedure Description Patient positioned in bed supine.  Sedation given as noted above.  Patient was intubated with endotracheal tube using Glidescope.  View was Grade 1 full glottis .  Number of attempts was 1.  Colorimetric CO2 detector was consistent with tracheal placement.   Complications/Tolerance None; patient tolerated the procedure well. Chest X-ray is ordered to verify placement. Grade 1 view patient signifcant kyphosis making positioning difficult.    EBL Minimal   Specimen(s) None

## 2021-04-07 NOTE — Progress Notes (Signed)
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  Cough is productive of yellow/green sputum.  Patient reports symptoms of malaise, decreased appetite over the last 3-4 days and daughter noticed patient had increased productive cough with associated nausea, vomiting.  Over the last 2 days, patient has not been able to eat or drink anything.  He also endorses that sometimes he feels "food stuck in his throat", and daughter reports that he sometimes coughs after eating or drinking.  Patient denies history of COPD, but long history of tobacco use disorder, CT scan 2014 shows signs of emphysema but patient never followed up with pulmonology.  Patient reportedly has been using her his daughter COPD medication including albuterol MDI for his coughing chronically.  Patient not followed up with any physician over the last 2 years.  On EMS arrival, patient was noted to have O2 saturation in the 70s and he was initially placed on CPAP.  In the ED, WBC 16.9, bilirubin 2.1, AST 76, creatinine 1.6.  Chest x-ray notable for bilateral infiltrates concerning for pneumonia.  Hospital service consulted for further evaluation and treatment of acute hypoxic respiratory failure secondary to pneumonia and COPD exacerbation.   Assessment & Plan:   Principal Problem:   Acute respiratory failure (HCC) Active Problems:   Dyslipidemia   Essential hypertension   Acute kidney injury ([place])   Community acquired pneumonia   Sepsis ([place])   GAD (generalized anxiety  disorder)   Dysphagia   Acute hypoxic respiratory failure, POA Patient presenting with productive cough with green/yellow sputum in the setting of longstanding history of tobacco use disorder.  Patient was found to be hypoxic by EMS with SPO2 in the 70s initially requiring CPAP.  Etiology likely secondary to pneumonia and COPD exacerbation.  Continue treatment as below.  Sepsis, POA Community-acquired versus aspiration pneumonia Patient presenting with progressive shortness of breath associated with yellow/green sputum.  Patient was notably dyspneic and hypoxic on arrival.  CT chest with airspace opacities noted in the lingula, bilateral lobes with bronchial plugging consistent with multifocal infection versus aspiration.  Completed 5-day course of azithromycin and 7-day course of cefepime. --WBC 16.9>13.7>9.8>13.4>11.2>11.0>11.8>18.4> --Chest physiotherapy --Mucinex --Continue supplemental oxygen, maintain SPO2 greater than 88%; currently on HHFNC w/ FiO2 100% and 40L --Flutter valve/incentive spirometry --Bipap prn and QHS  COPD exacerbation Patient with longstanding history of tobacco use disorder.  Has been utilizing his daughter's albuterol MDI recently.  No formal diagnosis but imaging notable for emphysematous changes in the lungs. --Xopenex/Atrovent nebs  --Solumedrol '40mg'$  IV q12h --Pulmicort nebs twice daily --Mucinex 1200 mg PO BID --Continue supplemental oxygen as above  Acute renal failure Creatinine 1.63 on admission.  Etiology likely prerenal azotemia versus ATN secondary to sepsis/pneumonia as above. --Cr 1.63>1.44>1.30>1.27>1.25>1.24>1.22>1.24>1.31>1.27 --Furosemide 40 mg IV q24h --Avoid nephrotoxins, renally dose all medications --Repeat BMP in the a.m.  Nausea/vomiting: Overnight, episode of nausea and vomiting.   --Zofran IV prn --Reglan 5 mg IV every 8 hour as needed for refractory nausea/vomiting --Check KUB  Chronic combined systolic and diastolic  congestive heart failure, decompensated Patient with history of chronic systolic congestive  heart failure.  TTE with LVEF now improved to 60-65% (was 40% in 2014) with grade 1 diastolic dysfunction and trivial MR. BNP elevated 691.1. --net negative 1.2L past 24 hours and net negative 2.3L since admission --Continue carvedilol 3.125 mg p.o. twice daily --Furosemide 40 mg IV q24h --Strict I's and O's and daily weights --BMP daily  T2DM Hemoglobin A1c 6.5. --SSI for coverage --CBGs qAC/HS  Hypokalemia Potassium 3.5, will replete. --Repeat electrolytes in a.m. to include magnesium  Dysphagia Patient reports sometimes feeling food getting stuck in his throat, and daughter reports that he sometimes coughs after eating/drinking.  Speech therapy was consulted and has followed during hospital course.  Underwent MBS on 04/02/2021 with mild aspiration event caused by prolonged coughing; symptoms related primarily to respiratory impairments and with larger consecutive sips of thin liquids on consecutive swallows. --Speech therapy following, appreciate assistance --Soft diet with nectar thick liquids --Aspiration precautions, needs to sit in a full upright position and slow swallowing while eating  Essential hypertension BP 140/69 this morning. --Carvedilol 3.125 mg p.o. twice daily --Amlodipine 10 mg p.o. daily today --Hydralazine 25 mg p.o. every 8 hours prn SBP >180 or DBP >110 --Continue monitor BP and adjust antihypertensives as needed  Generalized anxiety disorder --Prozac 20 mg p.o. daily --Xanax 0.'5mg'$  PO TID prn  GERD: Gaseous distention --Protonix 40 mg p.o. daily --Maalox/Mylanta as needed  HLD: Zocor 20 mg p.o. daily    DVT prophylaxis: heparin injection 5,000 Units Start: 03/29/21 1500   Code Status: Full Code Family Communication: updated patients daughter, Jeani Hawking via telephone yesterday Disposition Plan:  Level of care: Telemetry Medical Status is: Inpatient  Remains  inpatient appropriate because:Ongoing diagnostic testing needed not appropriate for outpatient work up, Unsafe d/c plan, IV treatments appropriate due to intensity of illness or inability to take PO, and Inpatient level of care appropriate due to severity of illness  Dispo: The patient is from: Home              Anticipated d/c is to: Home              Patient currently is not medically stable to d/c.   Difficult to place patient No   Consultants:  none  Procedures:  TTE MBS  Antimicrobials:  Azithromycin 6/17 - 6/22 Cefepime 6/17 - 6/24 Ceftriaxone 6/17 - 6/17   Subjective: Patient seen examined bedside, resting comfortably.  Sleeping but easily arousable.  States his shortness of breath is improved; although continues on 40 L heated high flow nasal cannula with FiO2 100%.  No family present at bedside this morning.  No other questions or concerns at this time. Denies headache, no fever/chills/night sweats, no diarrhea, no chest pain, no palpitations, no abdominal pain, no fatigue, no paresthesias.  No other acute events overnight per nursing staff.  Objective: Vitals:   04/06/21 2000 04/06/21 2043 04/07/21 0500 04/07/21 0729  BP:  132/65    Pulse: 82 84 68   Resp: (!) 24     Temp:  97.6 F (36.4 C) 97.6 F (36.4 C)   TempSrc:  Oral Oral   SpO2:    93%  Weight:   67.7 kg   Height:        Intake/Output Summary (Last 24 hours) at 04/07/2021 1212 Last data filed at 04/07/2021 0500 Gross per 24 hour  Intake --  Output 300 ml  Net -300 ml   Filed Weights   04/05/21 0500 04/06/21 0443 04/07/21 0500  Weight: 70.5 kg 68.8 kg 67.7  kg    Examination:  General exam: Calm/comfortable, heated high flow nasal cannula noted, appears older than stated age and chronically ill in appearance Respiratory system: Coarse breath sounds bilaterally with mid expiratory wheezing throughout all lung fields, no crackles/rhonchi, no accessory muscle use, on HHFNC w/ FiO2 100% and  40L Cardiovascular system: S1 & S2 heard, RRR. No JVD, murmurs, rubs, gallops or clicks. No pedal edema. Gastrointestinal system: Abdomen is nondistended, soft and nontender. No organomegaly or masses felt. Normal bowel sounds heard. Central nervous system: Alert and oriented. No focal neurological deficits. Extremities: Symmetric 5 x 5 power. Skin: No rashes, lesions or ulcers Psychiatry: Judgement and insight appear poor.  Mood & affect appropriate.     Data Reviewed: I have personally reviewed following labs and imaging studies  CBC: Recent Labs  Lab 04/03/21 0037 04/04/21 0137 04/05/21 0043 04/06/21 0254 04/07/21 0200  WBC 11.2* 11.0* 11.8* 18.4* 20.6*  HGB 14.4 15.3 15.8 16.6 15.9  HCT 43.9 45.9 48.9 50.5 49.8  MCV 88.2 87.4 88.6 87.5 88.1  PLT 436* 471* 441* 460* [id number]*   Basic Metabolic Panel: Recent Labs  Lab 04/03/21 0037 04/04/21 0137 04/05/21 0043 04/06/21 0254 04/07/21 0200  NA 142 143 142 141 140  K 3.5 3.4* 3.5 3.8 4.0  CL 106 106 101 96* 94*  CO2 30 31 33* 36* 39*  GLUCOSE 208* 188* 182* 153* 143*  BUN 23 22 31* 35* 40*  CREATININE 1.24 1.22 1.24 1.31* 1.27*  CALCIUM 8.8* 8.9 8.6* 8.7* 8.6*  MG  --   --  2.3 2.4 3.0*   GFR: Estimated Creatinine Clearance: 48.1 mL/min (A) (by C-G formula based on SCr of 1.27 mg/dL (H)). Liver Function Tests: No results for input(s): AST, ALT, ALKPHOS, BILITOT, PROT, ALBUMIN in the last 168 hours.  No results for input(s): LIPASE, AMYLASE in the last 168 hours.  No results for input(s): AMMONIA in the last 168 hours. Coagulation Profile: No results for input(s): INR, PROTIME in the last 168 hours. Cardiac Enzymes: No results for input(s): CKTOTAL, CKMB, CKMBINDEX, TROPONINI in the last 168 hours. BNP (last 3 results) No results for input(s): PROBNP in the last 8760 hours. HbA1C: No results for input(s): HGBA1C in the last 72 hours.  CBG: Recent Labs  Lab 04/06/21 1200 04/06/21 1558 04/06/21 2120  04/07/21 0749 04/07/21 1155  GLUCAP 197* 155* 236* 162* 195*   Lipid Profile: No results for input(s): CHOL, HDL, LDLCALC, TRIG, CHOLHDL, LDLDIRECT in the last 72 hours. Thyroid Function Tests: No results for input(s): TSH, T4TOTAL, FREET4, T3FREE, THYROIDAB in the last 72 hours. Anemia Panel: No results for input(s): VITAMINB12, FOLATE, FERRITIN, TIBC, IRON, RETICCTPCT in the last 72 hours. Sepsis Labs: No results for input(s): PROCALCITON, LATICACIDVEN in the last 168 hours.   Recent Results (from the past 240 hour(s))  Resp Panel by RT-PCR (Flu A&B, Covid) Nasopharyngeal Swab     Status: None   Collection Time: 03/29/21 10:38 AM   Specimen: Nasopharyngeal Swab; Nasopharyngeal(NP) swabs in vial transport medium  Result Value Ref Range Status   SARS Coronavirus 2 by RT PCR NEGATIVE NEGATIVE Final    Comment: (NOTE) SARS-CoV-2 target nucleic acids are NOT DETECTED.  The SARS-CoV-2 RNA is generally detectable in upper respiratory specimens during the acute phase of infection. The lowest concentration of SARS-CoV-2 viral copies this assay can detect is 138 copies/mL. A negative result does not preclude SARS-Cov-2 infection and should not be used as the sole basis for treatment or other patient  management decisions. A negative result may occur with  improper specimen collection/handling, submission of specimen other than nasopharyngeal swab, presence of viral mutation(s) within the areas targeted by this assay, and inadequate number of viral copies(<138 copies/mL). A negative result must be combined with clinical observations, patient history, and epidemiological information. The expected result is Negative.  Fact Sheet for Patients:  EntrepreneurPulse.com.au  Fact Sheet for Healthcare Providers:  IncredibleEmployment.be  This test is no t yet approved or cleared by the [place] FDA and  has been authorized for detection and/or  diagnosis of SARS-CoV-2 by FDA under an Emergency Use Authorization (EUA). This EUA will remain  in effect (meaning this test can be used) for the duration of the COVID-19 declaration under Section 564(b)(1) of the Act, 21 U.S.C.section 360bbb-3(b)(1), unless the authorization is terminated  or revoked sooner.       Influenza A by PCR NEGATIVE NEGATIVE Final   Influenza B by PCR NEGATIVE NEGATIVE Final    Comment: (NOTE) The Xpert Xpress SARS-CoV-2/FLU/RSV plus assay is intended as an aid in the diagnosis of influenza from Nasopharyngeal swab specimens and should not be used as a sole basis for treatment. Nasal washings and aspirates are unacceptable for Xpert Xpress SARS-CoV-2/FLU/RSV testing.  Fact Sheet for Patients: EntrepreneurPulse.com.au  Fact Sheet for Healthcare Providers: IncredibleEmployment.be  This test is not yet approved or cleared by the [place] FDA and has been authorized for detection and/or diagnosis of SARS-CoV-2 by FDA under an Emergency Use Authorization (EUA). This EUA will remain in effect (meaning this test can be used) for the duration of the COVID-19 declaration under Section 564(b)(1) of the Act, 21 U.S.C. section 360bbb-3(b)(1), unless the authorization is terminated or revoked.  Performed at [facility], [place] [address], [place], [place] [address]   Blood culture (routine single)     Status: None   Collection Time: 03/29/21 11:35 AM   Specimen: BLOOD LEFT HAND  Result Value Ref Range Status   Specimen Description BLOOD LEFT HAND  Final   Special Requests   Final    BOTTLES DRAWN AEROBIC AND ANAEROBIC Blood Culture adequate volume   Culture   Final    NO GROWTH 5 DAYS Performed at [facility], [place] [address] [place], [place] [address]    Report Status 04/03/2021 FINAL  Final  Culture, blood (single)     Status: None   Collection Time: 03/29/21 11:42 AM   Specimen: BLOOD RIGHT FOREARM   Result Value Ref Range Status   Specimen Description BLOOD RIGHT FOREARM  Final   Special Requests   Final    BOTTLES DRAWN AEROBIC AND ANAEROBIC Blood Culture adequate volume   Culture   Final    NO GROWTH 5 DAYS Performed at [facility], [place] [address], [place], [place] [address]    Report Status 04/03/2021 FINAL  Final  Urine culture     Status: Abnormal   Collection Time: 03/29/21 11:55 AM   Specimen: In/Out Cath Urine  Result Value Ref Range Status   Specimen Description IN/OUT CATH URINE  Final   Special Requests   Final    NONE Performed at [facility], [place] [address] [place], [place] [address]    Culture 2,000 COLONIES/mL STAPHYLOCOCCUS EPIDERMIDIS (A)  Final   Report Status 03/31/2021 FINAL  Final   Organism ID, Bacteria STAPHYLOCOCCUS EPIDERMIDIS (A)  Final      Susceptibility   Staphylococcus epidermidis - MIC*  CIPROFLOXACIN <=0.5 SENSITIVE Sensitive     GENTAMICIN <=0.5 SENSITIVE Sensitive     NITROFURANTOIN <=16 SENSITIVE Sensitive     OXACILLIN <=0.25 SENSITIVE Sensitive     TETRACYCLINE <=1 SENSITIVE Sensitive     VANCOMYCIN 2 SENSITIVE Sensitive     TRIMETH/SULFA <=10 SENSITIVE Sensitive     CLINDAMYCIN <=0.25 SENSITIVE Sensitive     RIFAMPIN <=0.5 SENSITIVE Sensitive     Inducible Clindamycin NEGATIVE Sensitive     * 2,000 COLONIES/mL STAPHYLOCOCCUS EPIDERMIDIS         Radiology Studies: DG CHEST PORT 1 VIEW  Result Date: 04/07/2021 CLINICAL DATA:  Short of breath and cough. Follow-up for infiltrates and pleural fluid. EXAM: PORTABLE CHEST 1 VIEW COMPARISON:  04/04/2021 and older studies. FINDINGS: Small, left greater right pleural effusions obscure the left partly obscures the right hemidiaphragms. There is additional opacity at the lung bases consistent with atelectasis, also greater on the left. Bilateral interstitial opacities with intervening hazy opacities, latter most evident on the left, are without significant change from the  prior exam. Cardiac silhouette normal in size. No pneumothorax. IMPRESSION: 1. Findings are similar to the most recent prior chest radiograph with left greater than right lung base opacities due in part to small pleural effusions, as well as more diffuse interstitial and hazy airspace lung opacities. Suspect a combination of multifocal infection or, less likely, asymmetric edema, superimposed on chronic lung abnormalities. Electronically Signed   By: Lajean Manes M.D.   On: 04/07/2021 10:28   DG Abd Portable 1V  Result Date: 04/05/2021 CLINICAL DATA:  Abdominal distension EXAM: PORTABLE ABDOMEN - 1 VIEW COMPARISON:  Portable exam 1220 hours compared to 08/20/2012 FINDINGS: Retained contrast throughout colon as well as some retained contrast in stomach. Scattered sigmoid diverticula. No bowel dilatation or bowel wall thickening. Bones demineralized. Scattered surgical clips in the upper abdomen bilaterally. Persistent atelectasis versus infiltrate and or effusion at LEFT lung base. IMPRESSION: Nonobstructive bowel gas pattern. Sigmoid diverticulosis Electronically Signed   By: Lavonia Dana M.D.   On: 04/05/2021 14:20        Scheduled Meds:  acidophilus  1 capsule Oral TID   ALPRAZolam  0.5 mg Oral TID   amLODipine  10 mg Oral Daily   budesonide (PULMICORT) nebulizer solution  0.5 mg Nebulization BID   carvedilol  3.125 mg Oral BID WC   FLUoxetine  20 mg Oral Daily   furosemide  40 mg Intravenous Daily   guaiFENesin  1,200 mg Oral BID   heparin  5,000 Units Subcutaneous Q12H   insulin aspart  0-5 Units Subcutaneous QHS   insulin aspart  0-6 Units Subcutaneous TID WC   ipratropium  2.5 mL Inhalation TID   levalbuterol  1.25 mg Inhalation TID   loratadine  10 mg Oral Daily   methylPREDNISolone (SOLU-MEDROL) injection  40 mg Intravenous Q12H   pantoprazole  40 mg Oral Daily   simvastatin  20 mg Oral QHS   Continuous Infusions:     LOS: 9 days    Time spent: 39 minutes spent on chart  review, discussion with nursing staff, consultants, updating family and interview/physical exam; more than 50% of that time was spent in counseling and/or coordination of care.    Edgel Degnan J [place] DO [facility] Available via Epic secure chat 7am-7pm After these hours, please refer to coverage provider listed on [email or web address] 04/07/2021, 12:12 PM

## 2021-04-07 NOTE — Progress Notes (Signed)
Sputum culture collected and sent to the lab. 

## 2021-04-07 NOTE — Progress Notes (Signed)
ETT retracted by 2cm per MD order. ETT now secured at 24 at the lips.

## 2021-04-07 NOTE — Procedures (Signed)
Diagnostic Bronchoscopy Procedure Note   VESTA HENDRIKS  [id number]  12/11/1945  Date:04/07/21  Time:7:36 PM   Provider Performing:Silvano Garofano E Orpah Melter  Procedure: Diagnostic Bronchoscopy [id number])  Indication(s) Assist with visualization of foreign body  Consent Risks of the procedure as well as the alternatives and risks of each were explained to the patient and/or caregiver.  Consent for the procedure was obtained.   Anesthesia Etomidate and rocuronium   Time Out Verified patient identification, verified procedure, site/side was marked, verified correct patient position, special equipment/implants available, medications/allergies/relevant history reviewed, required imaging and test results available.   Sterile Technique Usual hand hygiene, masks, gowns, and gloves were used   Procedure Description Bronchoscope advance through ET tube. There was a 2-3 mm piece of watermelon at end of ET tube. Non obstructive and thin of minimal consequence other than to note that patient aspirated. Unable to be aspirated with bronchoscope. Not large enough to require removal with more invasive techniques. All other airways inlcuding RUL, RML, RLL, LUL, lingula, and LLL. Free of obstruction and foreign body.     Complications/Tolerance None; patient tolerated the procedure well..   EBL None   Specimen(s) None

## 2021-04-07 NOTE — Progress Notes (Signed)
Attempted to obtain sputum culture, not able to at this time.  Will try again later.

## 2021-04-07 NOTE — Progress Notes (Signed)
[facility] night shift PCU coverage note.  The patient aspirated a piece of watermelon.  RRT and PCCM were called.  The patient underwent emergent endotracheal intubation with bedside bronchoscopy which revealed nonobstructive pieces of watermelon that were not large enough to require removal with more invasive techniques.  Please see RRT, nursing, respiratory therapy and PCCM notes for further detail.  Tennis Must, MD.

## 2021-04-07 NOTE — Progress Notes (Signed)
Pt intubated and Bronched on 2W by CCM MD and transferred to 0000000 without complication

## 2021-04-07 NOTE — Progress Notes (Signed)
Critical ABG values given to Dr. Orpah Melter.  Increased RR from 20 to 25

## 2021-04-07 NOTE — Progress Notes (Signed)
Pharmacy Antibiotic Note  Kristopher [name] is a 75 y.o. male admitted on 03/29/2021 with  sepsis/PNA .  Pharmacy has been consulted for vancomycin and zosyn dosing.  Orders to resume antibiotics for possible aspiration pneumonia. WBC up to 27, no fevers noted, bp low and starting on pressors. Obtaining new cultures. Scr stable at 1.3.   Vancomycin 1000 mg IV Q 24 hrs. Goal AUC 400-550. Expected AUC: 474 SCr used: 1.37   Plan: Vancomycin '1500mg'$  IV x1 then 1g q24 Zosyn 3.375g IV q8 hours  Height: '5\' 9"'$  (175.3 cm) Weight: 67.7 kg (149 lb 4 oz) IBW/kg (Calculated) : 70.7  Temp (24hrs), Avg:97.5 F (36.4 C), Min:97.3 F (36.3 C), Max:97.6 F (36.4 C)  Recent Labs  Lab 04/04/21 0137 04/05/21 0043 04/06/21 0254 04/07/21 0200 04/07/21 2118  WBC 11.0* 11.8* 18.4* 20.6* 27.2*  CREATININE 1.22 1.24 1.31* 1.27* 1.37*     Estimated Creatinine Clearance: 44.6 mL/min (A) (by C-G formula based on SCr of 1.37 mg/dL (H)).    No Known Allergies  Erin Hearing PharmD., BCPS Clinical Pharmacist 04/07/2021 10:22 PM
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
## 2021-04-08 LAB — CBC
HCT: 50.1 % (ref 39.0–52.0)
Hemoglobin: 16 g/dL (ref 13.0–17.0)
MCH: 28.8 pg (ref 26.0–34.0)
MCHC: 31.9 g/dL (ref 30.0–36.0)
MCV: 90.3 fL (ref 80.0–100.0)
Platelets: 364 10*3/uL (ref 150–400)
RBC: 5.55 MIL/uL (ref 4.22–5.81)
RDW: 15.2 % (ref 11.5–15.5)
WBC: 26.6 10*3/uL — ABNORMAL HIGH (ref 4.0–10.5)
nRBC: 0.1 % (ref 0.0–0.2)

## 2021-04-08 LAB — BASIC METABOLIC PANEL
Anion gap: 8 (ref 5–15)
Anion gap: 7 (ref 5–15)
BUN: 48 mg/dL — ABNORMAL HIGH (ref 8–23)
BUN: 47 mg/dL — ABNORMAL HIGH (ref 8–23)
CO2: 34 mmol/L — ABNORMAL HIGH (ref 22–32)
CO2: 37 mmol/L — ABNORMAL HIGH (ref 22–32)
Calcium: 7.9 mg/dL — ABNORMAL LOW (ref 8.9–10.3)
Calcium: 8.1 mg/dL — ABNORMAL LOW (ref 8.9–10.3)
Chloride: 97 mmol/L — ABNORMAL LOW (ref 98–111)
Chloride: 97 mmol/L — ABNORMAL LOW (ref 98–111)
Creatinine, Ser: 1.5 mg/dL — ABNORMAL HIGH (ref 0.61–1.24)
Creatinine, Ser: 1.42 mg/dL — ABNORMAL HIGH (ref 0.61–1.24)
GFR, Estimated: 48 mL/min — ABNORMAL LOW (ref >60)
GFR, Estimated: 52 mL/min — ABNORMAL LOW (ref >60)
Glucose, Bld: 203 mg/dL — ABNORMAL HIGH (ref 70–99)
Glucose, Bld: 137 mg/dL — ABNORMAL HIGH (ref 70–99)
Potassium: 5.2 mmol/L — ABNORMAL HIGH (ref 3.5–5.1)
Potassium: 4.4 mmol/L (ref 3.5–5.1)
Sodium: 139 mmol/L (ref 135–145)
Sodium: 141 mmol/L (ref 135–145)

## 2021-04-08 LAB — POCT I-STAT 7, (LYTES, BLD GAS, ICA,H+H)
Acid-Base Excess: 15 mmol/L — ABNORMAL HIGH (ref 0.0–2.0)
Bicarbonate: 43.1 mmol/L — ABNORMAL HIGH (ref 20.0–28.0)
Calcium, Ion: 1.13 mmol/L — ABNORMAL LOW (ref 1.15–1.40)
HCT: 50 % (ref 39.0–52.0)
Hemoglobin: 17 g/dL (ref 13.0–17.0)
O2 Saturation: 93 %
Potassium: 4.4 mmol/L (ref 3.5–5.1)
Sodium: 137 mmol/L (ref 135–145)
TCO2: 45 mmol/L — ABNORMAL HIGH (ref 22–32)
pCO2 arterial: 62.7 mmHg — ABNORMAL HIGH (ref 32.0–48.0)
pH, Arterial: 7.445 (ref 7.350–7.450)
pO2, Arterial: 68 mmHg — ABNORMAL LOW (ref 83.0–108.0)

## 2021-04-08 LAB — GLUCOSE, CAPILLARY
Glucose-Capillary: 201 mg/dL — ABNORMAL HIGH (ref 70–99)
Glucose-Capillary: 131 mg/dL — ABNORMAL HIGH (ref 70–99)
Glucose-Capillary: 122 mg/dL — ABNORMAL HIGH (ref 70–99)
Glucose-Capillary: 137 mg/dL — ABNORMAL HIGH (ref 70–99)
Glucose-Capillary: 151 mg/dL — ABNORMAL HIGH (ref 70–99)
Glucose-Capillary: 185 mg/dL — ABNORMAL HIGH (ref 70–99)

## 2021-04-08 LAB — MAGNESIUM
Magnesium: 2.9 mg/dL — ABNORMAL HIGH (ref 1.7–2.4)
Magnesium: 3 mg/dL — ABNORMAL HIGH (ref 1.7–2.4)

## 2021-04-08 LAB — PHOSPHORUS: Phosphorus: 3.1 mg/dL (ref 2.5–4.6)

## 2021-04-08 LAB — PROCALCITONIN: Procalcitonin: 0.1 ng/mL

## 2021-04-08 MED ORDER — PANTOPRAZOLE SODIUM 40 MG PO PACK
40.0000 mg | PACK | Freq: Every day | ORAL | Status: DC
  Administered 2021-04-08 – 2021-04-17 (×10): 40 mg
  Filled 2021-04-08 (×10): qty 20

## 2021-04-08 MED ORDER — INSULIN ASPART 100 UNIT/ML IJ SOLN
0.0000 [IU] | INTRAMUSCULAR | Status: DC
  Administered 2021-04-08 (×2): 1 [IU] via SUBCUTANEOUS
  Administered 2021-04-08: 2 [IU] via SUBCUTANEOUS
  Administered 2021-04-09: 1 [IU] via SUBCUTANEOUS
  Administered 2021-04-09: 2 [IU] via SUBCUTANEOUS
  Administered 2021-04-09: 1 [IU] via SUBCUTANEOUS
  Administered 2021-04-09: 2 [IU] via SUBCUTANEOUS
  Administered 2021-04-09 – 2021-04-10 (×6): 1 [IU] via SUBCUTANEOUS
  Administered 2021-04-10 – 2021-04-11 (×3): 2 [IU] via SUBCUTANEOUS
  Administered 2021-04-11 (×3): 1 [IU] via SUBCUTANEOUS
  Administered 2021-04-12: 2 [IU] via SUBCUTANEOUS
  Administered 2021-04-12 – 2021-04-14 (×12): 1 [IU] via SUBCUTANEOUS
  Administered 2021-04-14: 2 [IU] via SUBCUTANEOUS
  Administered 2021-04-15 – 2021-04-17 (×10): 1 [IU] via SUBCUTANEOUS

## 2021-04-08 MED ORDER — ALUM & MAG HYDROXIDE-SIMETH 200-200-20 MG/5ML PO SUSP
30.0000 mL | Freq: Four times a day (QID) | ORAL | Status: DC | PRN
  Filled 2021-04-08 (×2): qty 30

## 2021-04-08 MED ORDER — LORATADINE 10 MG PO TABS
10.0000 mg | ORAL_TABLET | Freq: Every day | ORAL | Status: DC
  Administered 2021-04-08 – 2021-04-17 (×10): 10 mg
  Filled 2021-04-08 (×10): qty 1

## 2021-04-08 MED ORDER — FREE WATER
75.0000 mL | Status: DC
  Administered 2021-04-08 – 2021-04-12 (×23): 75 mL

## 2021-04-08 MED ORDER — HEPARIN SODIUM (PORCINE) 5000 UNIT/ML IJ SOLN
5000.0000 [IU] | Freq: Three times a day (TID) | INTRAMUSCULAR | Status: DC
  Administered 2021-04-08 – 2021-04-17 (×28): 5000 [IU] via SUBCUTANEOUS
  Filled 2021-04-08 (×28): qty 1

## 2021-04-08 MED ORDER — CARISOPRODOL 350 MG PO TABS: 350.0000 mg | ORAL_TABLET | Freq: Every evening | ORAL | Status: DC | PRN

## 2021-04-08 MED ORDER — FLUOXETINE HCL 10 MG PO CAPS
20.0000 mg | ORAL_CAPSULE | Freq: Every day | ORAL | Status: DC
  Administered 2021-04-08 – 2021-04-17 (×10): 20 mg
  Filled 2021-04-08 (×10): qty 2

## 2021-04-08 MED ORDER — GUAIFENESIN 200 MG PO TABS
200.0000 mg | ORAL_TABLET | ORAL | Status: DC
  Administered 2021-04-08 – 2021-04-12 (×24): 200 mg
  Filled 2021-04-08 (×25): qty 1

## 2021-04-08 MED ORDER — AMLODIPINE BESYLATE 10 MG PO TABS
10.0000 mg | ORAL_TABLET | Freq: Every day | ORAL | Status: DC
  Administered 2021-04-08 – 2021-04-17 (×10): 10 mg
  Filled 2021-04-08 (×10): qty 1

## 2021-04-08 MED ORDER — OSMOLITE 1.5 CAL PO LIQD
1000.0000 mL | ORAL | Status: DC
  Administered 2021-04-08 – 2021-04-17 (×10): 1000 mL
  Filled 2021-04-08 (×13): qty 1000

## 2021-04-08 MED ORDER — DOCUSATE SODIUM 50 MG/5ML PO LIQD
100.0000 mg | Freq: Two times a day (BID) | ORAL | Status: DC
  Administered 2021-04-08 – 2021-04-17 (×19): 100 mg
  Filled 2021-04-08 (×19): qty 10

## 2021-04-08 MED ORDER — POLYETHYLENE GLYCOL 3350 17 G PO PACK
17.0000 g | PACK | Freq: Two times a day (BID) | ORAL | Status: DC
  Administered 2021-04-08 – 2021-04-17 (×19): 17 g
  Filled 2021-04-08 (×19): qty 1

## 2021-04-08 MED ORDER — PROSOURCE TF PO LIQD
45.0000 mL | Freq: Three times a day (TID) | ORAL | Status: DC
  Administered 2021-04-08 – 2021-04-17 (×28): 45 mL
  Filled 2021-04-08 (×28): qty 45

## 2021-04-08 MED ORDER — SODIUM ZIRCONIUM CYCLOSILICATE 10 G PO PACK
10.0000 g | PACK | Freq: Once | ORAL | Status: AC
  Administered 2021-04-08: 10 g
  Filled 2021-04-08: qty 1

## 2021-04-08 MED ORDER — ALPRAZOLAM 0.5 MG PO TABS
0.5000 mg | ORAL_TABLET | Freq: Three times a day (TID) | ORAL | Status: DC
  Administered 2021-04-08 – 2021-04-17 (×29): 0.5 mg
  Filled 2021-04-08 (×29): qty 1

## 2021-04-08 MED ORDER — DOCUSATE SODIUM 100 MG PO CAPS: 100.0000 mg | ORAL_CAPSULE | Freq: Every day | ORAL | Status: DC

## 2021-04-08 MED ORDER — FENTANYL BOLUS VIA INFUSION
30.0000 ug | INTRAVENOUS | Status: DC | PRN
  Administered 2021-04-08 – 2021-04-11 (×3): 30 ug via INTRAVENOUS
  Filled 2021-04-08: qty 30

## 2021-04-08 MED ORDER — SIMVASTATIN 20 MG PO TABS
20.0000 mg | ORAL_TABLET | Freq: Every day | ORAL | Status: DC
  Administered 2021-04-08 – 2021-04-16 (×9): 20 mg
  Filled 2021-04-08 (×10): qty 1

## 2021-04-08 MED ORDER — CARVEDILOL 3.125 MG PO TABS
3.1250 mg | ORAL_TABLET | Freq: Two times a day (BID) | ORAL | Status: DC
  Administered 2021-04-08 – 2021-04-17 (×19): 3.125 mg
  Filled 2021-04-08 (×20): qty 1

## 2021-04-08 MED ORDER — HYDRALAZINE HCL 50 MG PO TABS: 25.0000 mg | ORAL_TABLET | Freq: Three times a day (TID) | ORAL | Status: DC | PRN

## 2021-04-08 NOTE — Plan of Care (Signed)
  Problem: Education: Goal: Knowledge of General Education information will improve Description: Including pain rating scale, medication(s)/side effects and non-pharmacologic comfort measures Outcome: Progressing   Problem: Health Behavior/Discharge Planning: Goal: Ability to manage health-related needs will improve Outcome: Progressing   Problem: Clinical Measurements: Goal: Ability to maintain clinical measurements within normal limits will improve Outcome: Progressing Goal: Will remain free from infection Outcome: Progressing Goal: Diagnostic test results will improve Outcome: Progressing Goal: Respiratory complications will improve Outcome: Progressing Goal: Cardiovascular complication will be avoided Outcome: Progressing   Problem: Activity: Goal: Risk for activity intolerance will decrease Outcome: Progressing   Problem: Nutrition: Goal: Adequate nutrition will be maintained Outcome: Progressing   Problem: Coping: Goal: Level of anxiety will decrease Outcome: Progressing   Problem: Elimination: Goal: Will not experience complications related to bowel motility Outcome: Progressing Goal: Will not experience complications related to urinary retention Outcome: Progressing   Problem: Pain Managment: Goal: General experience of comfort will improve Outcome: Progressing   Problem: Safety: Goal: Ability to remain free from injury will improve Outcome: Progressing   Problem: Skin Integrity: Goal: Risk for impaired skin integrity will decrease Outcome: Progressing   Problem: Activity: Goal: Ability to tolerate increased activity will improve Outcome: Progressing   Problem: Respiratory: Goal: Ability to maintain a clear airway and adequate ventilation will improve Outcome: Progressing   Problem: Role Relationship: Goal: Method of communication will improve Outcome: Progressing   Problem: Education: Goal: Knowledge of General Education information will  improve Description: Including pain rating scale, medication(s)/side effects and non-pharmacologic comfort measures Outcome: Progressing   Problem: Health Behavior/Discharge Planning: Goal: Ability to manage health-related needs will improve Outcome: Progressing   Problem: Clinical Measurements: Goal: Ability to maintain clinical measurements within normal limits will improve Outcome: Progressing Goal: Will remain free from infection Outcome: Progressing Goal: Diagnostic test results will improve Outcome: Progressing Goal: Respiratory complications will improve Outcome: Progressing Goal: Cardiovascular complication will be avoided Outcome: Progressing   Problem: Activity: Goal: Risk for activity intolerance will decrease Outcome: Progressing   Problem: Nutrition: Goal: Adequate nutrition will be maintained Outcome: Progressing   Problem: Coping: Goal: Level of anxiety will decrease Outcome: Progressing   Problem: Elimination: Goal: Will not experience complications related to bowel motility Outcome: Progressing Goal: Will not experience complications related to urinary retention Outcome: Progressing   Problem: Pain Managment: Goal: General experience of comfort will improve Outcome: Progressing   Problem: Safety: Goal: Ability to remain free from injury will improve Outcome: Progressing   Problem: Skin Integrity: Goal: Risk for impaired skin integrity will decrease Outcome: Progressing   Problem: Activity: Goal: Ability to tolerate increased activity will improve Outcome: Progressing   Problem: Respiratory: Goal: Ability to maintain a clear airway and adequate ventilation will improve Outcome: Progressing   Problem: Role Relationship: Goal: Method of communication will improve Outcome: Progressing

## 2021-04-08 NOTE — Progress Notes (Signed)
[facility] Progress Note Patient Name: LOKI PRYDE DOB: January 19, 1946 MRN: [id number]   Date of Service  04/08/2021  HPI/Events of Note  Patient intubated and ventilated. Currently on AC/HS Novolog SSI. Last blood glucose = 252.   eICU Interventions  Will change to Q 4 hour very sensitive Novolog SSI.     Intervention Category Major Interventions: Hyperglycemia - active titration of insulin therapy  Loraine Bhullar Eugene 04/08/2021, 12:10 AM
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
## 2021-04-08 NOTE — Progress Notes (Signed)
Daily Progress Note   Patient Name: Kristopher [name]       Date: 04/08/2021 DOB: 1946-07-03  Age: 75 y.o. MRN#: [id number] Attending Physician: Audria Nine, DO Primary Care Physician: Susy Frizzle, MD Admit Date: 03/29/2021  Reason for Consultation/Follow-up: Establishing goals of care  Subjective: Events of weekend noted- possible aspiration and laryngospasm requiring intubation. Chest xray showing interstitial opacities throughout the lungs.   Length of Stay: 10  Current Medications: Scheduled Meds:   ALPRAZolam  0.5 mg Per Tube TID   amLODipine  10 mg Per Tube Daily   arformoterol  15 mcg Nebulization BID   budesonide (PULMICORT) nebulizer solution  0.5 mg Nebulization BID   carvedilol  3.125 mg Per Tube BID WC   chlorhexidine gluconate (MEDLINE KIT)  15 mL Mouth Rinse BID   Chlorhexidine Gluconate Cloth  6 each Topical Daily   docusate  100 mg Per Tube BID   FLUoxetine  20 mg Per Tube Daily   guaiFENesin  200 mg Per Tube Q4H   heparin  5,000 Units Subcutaneous Q8H   insulin aspart  0-6 Units Subcutaneous Q4H   ipratropium  2.5 mL Inhalation TID   levalbuterol  1.25 mg Inhalation TID   loratadine  10 mg Per Tube Daily   mouth rinse  15 mL Mouth Rinse 10 times per day   methylPREDNISolone (SOLU-MEDROL) injection  40 mg Intravenous Q24H   pantoprazole sodium  40 mg Per Tube Daily   polyethylene glycol  17 g Per Tube BID   simvastatin  20 mg Per Tube QHS    Continuous Infusions:  sodium chloride 250 mL (04/07/21 2312)   fentaNYL infusion INTRAVENOUS 125 mcg/hr (04/08/21 1047)   norepinephrine (LEVOPHED) Adult infusion Stopped (04/08/21 0301)    PRN Meds: alum & mag hydroxide-simeth, carisoprodol, fentaNYL, hydrALAZINE, levalbuterol, metoCLOPramide (REGLAN)  injection, ondansetron (ZOFRAN) IV, phenol  Physical Exam Vitals and nursing note reviewed.  Constitutional:      Appearance: He is ill-appearing.  Neurological:     Comments: sedated            Vital Signs: BP 120/71   Pulse 62   Temp 97.8 F (36.6 C) (Oral)   Resp 20   Ht _0  (1.753 m)   Wt 61.1 kg   SpO2 90%   BMI 19.89  kg/m  SpO2: SpO2: 90 % O2 Device: O2 Device: Ventilator O2 Flow Rate: O2 Flow Rate (L/min): (S) 40 L/min  Intake/output summary:  Intake/Output Summary (Last 24 hours) at 04/08/2021 1330 Last data filed at 04/08/2021 1136 Gross per 24 hour  Intake 1353.12 ml  Output 2125 ml  Net -771.88 ml   LBM: Last BM Date: 03/31/21 Baseline Weight: Weight: 71 kg Most recent weight: Weight: 61.1 kg       Palliative Assessment/Data: PPS: 10%      Patient Active Problem List   Diagnosis Date Noted   Dysphagia 03/31/2021   Sepsis ([place]) 03/30/2021   Acute respiratory failure ([employer]) 03/30/2021   GAD (generalized anxiety disorder) 03/30/2021   Community acquired pneumonia 03/29/2021   LFT elevation    Acute kidney injury ([place])    Grief at loss of child 08/20/2012   Healthcare maintenance 11/29/2011   TOBACCO ABUSE 11/23/2009   Dyslipidemia 05/01/2009   Essential hypertension 05/01/2009   CARDIOMYOPATHY 05/01/2009    Palliative Care Assessment & Plan   Patient Profile: 75 y.o. male  with past medical history of grade I diastolic heart failure, COPD, Covid 2020, s/p L nephrectomy, s/p splenectomy, HTN, dysphagia  admitted on 03/29/2021 with respiratory distress. Workup reveals pnuemonia- community acquired. He has required bipap to maintain saturations but did not want to wear it today due to it being uncomfortable. He is requiring intermittent NRB and heated high flow oxygen. Palliative medicine consulted due to patient declining to use bipap and determine how aggressive patient would like his medical care to be.      Assessment/Recommendations/Plan  Worsening respiratory status- was no 40L HHF prior to intubation Previous GOC discussion- patient stated he would want care that would be able to restore him to living independently and fishing- in our previous discussions he had indicated preference for DNR but allow for intubation if he had worsening respiratory status and still had pulse and was still breathing- his code status was reversed by attending the day after my discussion with him and his daughter PMT will follow and assist as needed Attempted to call daughter- VM  Goals of Care and Additional Recommendations: Limitations on Scope of Treatment: Full Scope Treatment  Code Status: Full code  Prognosis:  Unable to determine  Discharge Planning: To Be Determined   Thank you for allowing the Palliative Medicine Team to assist in the care of this patient.   Total time: 22 minutes Greater than 50%  of this time was spent counseling and coordinating care related to the above assessment and plan.  Mariana Kaufman, AGNP-C Palliative Medicine   Please contact Palliative Medicine Team phone at [phone] for questions and concerns.

## 2021-04-08 NOTE — Progress Notes (Signed)
Agitation and restlessness continues despite Fentanyl being titrated per order. Fentanyl currently at 225 mcg/hr. Contacted Elink Dr Oletta Darter  to notify of intermittent restlessness and agitation continuing .

## 2021-04-08 NOTE — Progress Notes (Signed)
Initial Nutrition Assessment  DOCUMENTATION CODES:  Not applicable  INTERVENTION:  Initiate tube feeding via OGT: Osmolite 1.5 at 50 ml/h (1200 ml per day) Prosource TF 45 ml TID Free Water: 75 mL q4h via OGT Provides 1920 kcal, 108 gm protein, 1364 ml free water daily (TF+free water) Request daily weights  NUTRITION DIAGNOSIS:  Inadequate oral intake related to inability to eat as evidenced by NPO status (vent).  GOAL:  Provide needs based on ASPEN/SCCM guidelines   MONITOR:  TF tolerance, I & O's, Vent status, Labs  REASON FOR ASSESSMENT:  Consult Enteral/tube feeding initiation and management  ASSESSMENT:  75 year old male presented to ED from PCP office with respiratory distress worsening over the last 2-3 days. At PCP, noted to be febrile (101.4) and hypoxic with O2 saturation of 76%. EMS was called to transport to ED. PMH relevant for dyslipidemia, DM type 2, cardiomyopathy, anxiety/depression, HTN, COVID19 infection 2020   Initial workup suggestive of pneumonia. Per daughter (who pt lives with) pt has reported that he feels "food stuck in the throat" and reports occasional severe cough after eating or drinking. SLP following. Pt has not been to [facility] in two years prior to this acute illness, has been using daughter's COPD medications.  Respiratory status has been precarious throughout admission frequently requiring BiPAP and significant oxygen support. Pt aspirated a piece of watermelon 6/26 requiring emergent intubation and bronchoscopy to remove food debris from airway. Transferred to ICU and remains intubated at this time.   Variable weight trends noted in hx, no recent outpatient weights to assess for loss, weight loss noted during admission. Unsure of accuracy as it appears pt has lost >20 lbs in 6 days. Will request daily weights to assist in determining accuracy.  6/17 - admitted with acute respiratory failure 6/21 - MBS completed, Regular solids;Nectar thick liquid  recommended by SLP 6/22 - SLP downgraded diet to DYS 2 with nectar 6/26 - aspiration event leading to intubation and bronchoscopy for removal of food from airway  OGT in place - gastric  Patient is currently intubated on ventilator support MV: 14 L/min Temp (24hrs), Avg:97.7 F (36.5 C), Min:97.3 F (36.3 C), Max:98 F (36.7 C)   Intake/Output Summary (Last 24 hours) at 04/08/2021 1546 Last data filed at 04/08/2021 1500 Gross per 24 hour  Intake 1560.85 ml  Output 2125 ml  Net -564.15 ml  Net IO Since Admission: -2,872.62 mL [04/08/21 1546]  Average Meal Intake: 6/17-6/26: 83% intake x 3 recorded meals (50-100%)  Nutritionally Relevant Medications: Scheduled Meds:  docusate  100 mg Per Tube BID   insulin aspart  0-6 Units Subcutaneous Q4H   methylPREDNISolone (SOLU-MEDROL) injection  40 mg Intravenous Q24H   pantoprazole sodium  40 mg Per Tube Daily   polyethylene glycol  17 g Per Tube BID   simvastatin  20 mg Per Tube QHS   Continuous Infusions:  sodium chloride 250 mL (04/07/21 2312)   fentaNYL infusion INTRAVENOUS 125 mcg/hr (04/08/21 1047)   PRN Meds:.alum & mag hydroxide-simeth, metoCLOPramide, ondansetron  Labs Reviewed: BUN 47, creatinine 1.42 Mg 2.9 SBG ranges from 122-252 mg/dL over the last 24 hours HgbA1c 6.5% (6/21)  NUTRITION - FOCUSED PHYSICAL EXAM: Defer to in-person assessment  Diet Order:   Diet Order             Diet NPO time specified  Diet effective now                   EDUCATION NEEDS:  Not  appropriate for education at this time  Skin:  Skin Assessment: Reviewed RN Assessment  Last BM:  6/25 per RN documentation  Height:  Ht Readings from Last 1 Encounters:  04/08/21 '5\' 9"'$  (1.753 m)    Weight:  Wt Readings from Last 1 Encounters:  04/08/21 61.1 kg    Ideal Body Weight:  72.7 kg  BMI:  Body mass index is 19.89 kg/m.  Estimated Nutritional Needs:  Kcal:  1800-2000 kcal/d Protein:  100-115 g/d Fluid:   1.8-2L/d   Ranell Patrick, RD, LDN Clinical Dietitian Pager on [place]

## 2021-04-08 NOTE — Progress Notes (Signed)
$  255 in loose cash, wallet with bank cards and $8 and keys sent home with patient's cousin, Trinna Post, at patient's request.

## 2021-04-08 NOTE — Progress Notes (Signed)
[facility] Progress Note Patient Name: SYLER SALMONS DOB: 08/21/1946 MRN: [id number]   Date of Service  04/08/2021  HPI/Events of Note  Hyperkalemia - K+ = 5.2.  eICU Interventions  Plan: Lokelma 10 gm per tube now. Repeat BMP at 12 noon.     Intervention Category Major Interventions: Electrolyte abnormality - evaluation and management  Garrett Mitchum [place] 04/08/2021, 5:01 AM

## 2021-04-08 NOTE — Progress Notes (Addendum)
[facility] Progress Note Patient Name: Kristopher [name] DOB: 1946/03/13 MRN: [id number]   Date of Service  04/08/2021  HPI/Events of Note  Agitation - Presently on a Fentanyl IV infusion.   eICU Interventions  Plan: Fentanyl bolus from infusion 30 mcg IV Q 1 hour PRN agitation. Titrate Fentanyl IV infusion as ordered.      Intervention Category Major Interventions: Delirium, psychosis, severe agitation - evaluation and management  Martie Muhlbauer [place] 04/08/2021, 3:16 AM
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
## 2021-04-09 LAB — CBC
HCT: 49.7 % (ref 39.0–52.0)
Hemoglobin: 15.9 g/dL (ref 13.0–17.0)
MCH: 28.6 pg (ref 26.0–34.0)
MCHC: 32 g/dL (ref 30.0–36.0)
MCV: 89.5 fL (ref 80.0–100.0)
Platelets: 321 10*3/uL (ref 150–400)
RBC: 5.55 MIL/uL (ref 4.22–5.81)
RDW: 15.6 % — ABNORMAL HIGH (ref 11.5–15.5)
WBC: 24.7 10*3/uL — ABNORMAL HIGH (ref 4.0–10.5)
nRBC: 0 % (ref 0.0–0.2)

## 2021-04-09 LAB — BASIC METABOLIC PANEL
Anion gap: 14 (ref 5–15)
BUN: 65 mg/dL — ABNORMAL HIGH (ref 8–23)
CO2: 32 mmol/L (ref 22–32)
Calcium: 8.3 mg/dL — ABNORMAL LOW (ref 8.9–10.3)
Chloride: 97 mmol/L — ABNORMAL LOW (ref 98–111)
Creatinine, Ser: 1.48 mg/dL — ABNORMAL HIGH (ref 0.61–1.24)
GFR, Estimated: 49 mL/min — ABNORMAL LOW (ref >60)
Glucose, Bld: 171 mg/dL — ABNORMAL HIGH (ref 70–99)
Potassium: 4.1 mmol/L (ref 3.5–5.1)
Sodium: 143 mmol/L (ref 135–145)

## 2021-04-09 LAB — GLUCOSE, CAPILLARY
Glucose-Capillary: 173 mg/dL — ABNORMAL HIGH (ref 70–99)
Glucose-Capillary: 178 mg/dL — ABNORMAL HIGH (ref 70–99)
Glucose-Capillary: 198 mg/dL — ABNORMAL HIGH (ref 70–99)
Glucose-Capillary: 211 mg/dL — ABNORMAL HIGH (ref 70–99)
Glucose-Capillary: 205 mg/dL — ABNORMAL HIGH (ref 70–99)
Glucose-Capillary: 172 mg/dL — ABNORMAL HIGH (ref 70–99)

## 2021-04-09 LAB — MAGNESIUM: Magnesium: 3.1 mg/dL — ABNORMAL HIGH (ref 1.7–2.4)

## 2021-04-09 LAB — PHOSPHORUS
Phosphorus: 2.7 mg/dL (ref 2.5–4.6)
Phosphorus: 2.5 mg/dL (ref 2.5–4.6)

## 2021-04-09 MED ORDER — LACTATED RINGERS IV SOLN: INTRAVENOUS | Status: AC

## 2021-04-09 MED ORDER — SENNOSIDES-DOCUSATE SODIUM 8.6-50 MG PO TABS
2.0000 | ORAL_TABLET | Freq: Two times a day (BID) | ORAL | Status: DC
  Administered 2021-04-09 – 2021-04-17 (×17): 2
  Filled 2021-04-09 (×17): qty 2

## 2021-04-09 NOTE — Progress Notes (Signed)
PT Cancellation Note  Patient Details Name: Kristopher [name] MRN: [id number] DOB: 29-Aug-1946   Cancelled Treatment:    Reason Eval/Treat Not Completed: Fatigue/lethargy limiting ability to participate  Per RN pt was more awake this morning, but has been sleeping/lethargic this pm. They are working on decreasing his FiO2 and RN recommended holding PT this pm.   Arby Barrette, PT Pager [phone]  Jeanie Cooks Sarya Linenberger 04/09/2021, 3:22 PM
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
## 2021-04-09 NOTE — Progress Notes (Signed)
OT Cancellation Note  Patient Details Name: Kristopher [name] MRN: [id number] DOB: 25-Nov-1945   Cancelled Treatment:    Reason Eval/Treat Not Completed: Medical issues which prohibited therapy. Patient currently intubated and mechanically ventilated on fentanyl for agitation with bilateral soft wrist restraints. OT to check back 6/28.   Gloris Manchester OTR/L Supplemental OT, Department of rehab services [phone]  Theo Reither R H. 04/09/2021, 12:05 PM

## 2021-04-09 NOTE — Progress Notes (Signed)
[facility] Progress Note Patient Name: Kristopher [name] DOB: 06/28/1946 MRN: [id number]   Date of Service  04/09/2021  HPI/Events of Note  Agitation - Patient trying to pull ETT. Nursing request for bilateral soft wrist restraints.  eICU Interventions  Plan: Bilateral soft wrist restraint X 6 hours.      Intervention Category Major Interventions: Delirium, psychosis, severe agitation - evaluation and management  Kimbra Marcelino Eugene 04/09/2021, 3:23 AM

## 2021-04-10 ENCOUNTER — Inpatient Hospital Stay (HOSPITAL_COMMUNITY): Payer: Medicare Other

## 2021-04-10 DIAGNOSIS — R1310 Dysphagia, unspecified: Secondary | ICD-10-CM | POA: Diagnosis not present

## 2021-04-10 DIAGNOSIS — J9601 Acute respiratory failure with hypoxia: Secondary | ICD-10-CM | POA: Diagnosis not present

## 2021-04-10 DIAGNOSIS — Z7189 Other specified counseling: Secondary | ICD-10-CM | POA: Diagnosis not present

## 2021-04-10 DIAGNOSIS — Z515 Encounter for palliative care: Secondary | ICD-10-CM | POA: Diagnosis not present

## 2021-04-10 LAB — BASIC METABOLIC PANEL
Anion gap: 7 (ref 5–15)
BUN: 76 mg/dL — ABNORMAL HIGH (ref 8–23)
CO2: 35 mmol/L — ABNORMAL HIGH (ref 22–32)
Calcium: 8.1 mg/dL — ABNORMAL LOW (ref 8.9–10.3)
Chloride: 101 mmol/L (ref 98–111)
Creatinine, Ser: 1.39 mg/dL — ABNORMAL HIGH (ref 0.61–1.24)
GFR, Estimated: 53 mL/min — ABNORMAL LOW (ref >60)
Glucose, Bld: 155 mg/dL — ABNORMAL HIGH (ref 70–99)
Potassium: 4.6 mmol/L (ref 3.5–5.1)
Sodium: 143 mmol/L (ref 135–145)

## 2021-04-10 LAB — CBC
HCT: 47.9 % (ref 39.0–52.0)
Hemoglobin: 15.8 g/dL (ref 13.0–17.0)
MCH: 29.1 pg (ref 26.0–34.0)
MCHC: 33 g/dL (ref 30.0–36.0)
MCV: 88.2 fL (ref 80.0–100.0)
Platelets: 292 10*3/uL (ref 150–400)
RBC: 5.43 MIL/uL (ref 4.22–5.81)
RDW: 15.6 % — ABNORMAL HIGH (ref 11.5–15.5)
WBC: 25.4 10*3/uL — ABNORMAL HIGH (ref 4.0–10.5)
nRBC: 0 % (ref 0.0–0.2)

## 2021-04-10 LAB — CULTURE, RESPIRATORY W GRAM STAIN: Culture: NORMAL

## 2021-04-10 LAB — GLUCOSE, CAPILLARY
Glucose-Capillary: 171 mg/dL — ABNORMAL HIGH (ref 70–99)
Glucose-Capillary: 156 mg/dL — ABNORMAL HIGH (ref 70–99)
Glucose-Capillary: 136 mg/dL — ABNORMAL HIGH (ref 70–99)
Glucose-Capillary: 165 mg/dL — ABNORMAL HIGH (ref 70–99)
Glucose-Capillary: 185 mg/dL — ABNORMAL HIGH (ref 70–99)
Glucose-Capillary: 202 mg/dL — ABNORMAL HIGH (ref 70–99)

## 2021-04-10 LAB — PHOSPHORUS: Phosphorus: 2.8 mg/dL (ref 2.5–4.6)

## 2021-04-10 LAB — MAGNESIUM: Magnesium: 3 mg/dL — ABNORMAL HIGH (ref 1.7–2.4)

## 2021-04-10 MED ORDER — BETHANECHOL CHLORIDE 10 MG PO TABS
10.0000 mg | ORAL_TABLET | Freq: Once | ORAL | Status: AC
  Administered 2021-04-10: 10 mg
  Filled 2021-04-10: qty 1

## 2021-04-10 MED ORDER — LORAZEPAM 2 MG/ML IJ SOLN
1.0000 mg | Freq: Four times a day (QID) | INTRAMUSCULAR | Status: DC | PRN
  Administered 2021-04-11: 1 mg via INTRAVENOUS
  Filled 2021-04-10: qty 1

## 2021-04-10 MED ORDER — MILK AND MOLASSES ENEMA
1.0000 | Freq: Once | RECTAL | Status: DC
  Filled 2021-04-10: qty 240

## 2021-04-10 MED ORDER — PREDNISONE 20 MG PO TABS
40.0000 mg | ORAL_TABLET | Freq: Every day | ORAL | Status: DC
  Filled 2021-04-10: qty 2

## 2021-04-10 MED ORDER — BETHANECHOL CHLORIDE 10 MG PO TABS
10.0000 mg | ORAL_TABLET | Freq: Three times a day (TID) | ORAL | Status: AC
  Administered 2021-04-10 – 2021-04-13 (×9): 10 mg
  Filled 2021-04-10 (×9): qty 1

## 2021-04-10 MED ORDER — FENTANYL CITRATE (PF) 100 MCG/2ML IJ SOLN
INTRAMUSCULAR | Status: AC
  Administered 2021-04-10: 100 ug
  Filled 2021-04-10: qty 2

## 2021-04-10 MED ORDER — PREDNISONE 20 MG PO TABS: 20.0000 mg | ORAL_TABLET | Freq: Every day | ORAL | Status: DC

## 2021-04-10 MED ORDER — MIDAZOLAM HCL 2 MG/2ML IJ SOLN
INTRAMUSCULAR | Status: AC
  Administered 2021-04-10: 2 mg
  Filled 2021-04-10: qty 2

## 2021-04-10 MED ORDER — BISACODYL 10 MG RE SUPP
10.0000 mg | Freq: Once | RECTAL | Status: AC
  Administered 2021-04-10: 10 mg via RECTAL
  Filled 2021-04-10: qty 1

## 2021-04-10 MED ORDER — MILK AND MOLASSES ENEMA
1.0000 | Freq: Once | RECTAL | Status: AC
  Administered 2021-04-10: 240 mL via RECTAL
  Filled 2021-04-10: qty 240

## 2021-04-10 MED ORDER — ETOMIDATE 2 MG/ML IV SOLN
INTRAVENOUS | Status: AC
  Administered 2021-04-10: 20 mg
  Filled 2021-04-10: qty 20

## 2021-04-10 MED ORDER — ROCURONIUM BROMIDE 10 MG/ML (PF) SYRINGE
PREFILLED_SYRINGE | INTRAVENOUS | Status: AC
  Filled 2021-04-10: qty 10

## 2021-04-10 MED ORDER — PREDNISONE 10 MG PO TABS: 10.0000 mg | ORAL_TABLET | Freq: Every day | ORAL | Status: DC

## 2021-04-10 MED ORDER — TAMSULOSIN HCL 0.4 MG PO CAPS: 0.4000 mg | ORAL_CAPSULE | Freq: Every day | ORAL | Status: DC

## 2021-04-10 NOTE — Procedures (Signed)
Intubation Procedure Note KRISHON LEITZKE [id number] 1946-07-26  Procedure: Intubation Indications: Airway protection and maintenance  Procedure Details Consent: Risks of procedure as well as the alternatives and risks of each were explained to the (patient/caregiver).  Consent for procedure obtained. Time Out: Verified patient identification, verified procedure, site/side was marked, verified correct patient position, special equipment/implants available, medications/allergies/relevent history reviewed, required imaging and test results available.  Performed  [place]   Evaluation Hemodynamic Status: BP stable throughout; O2 sats: stable throughout Patient's Current Condition: stable Complications: No apparent complications Patient did tolerate procedure well. Chest X-ray ordered to verify placement.  CXR: pending.   Harvie Heck, MD Internal Medicine, PGY-2 04/10/21 11:36 AM Pager # [phone]   Procedure performed under direct supervision of Dr Audria Nine.
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
## 2021-04-10 NOTE — Progress Notes (Signed)
Daily Progress Note   Patient Name: Kristopher [name]       Date: 04/10/2021 DOB: Oct 05, 1946  Age: 75 y.o. MRN#: [id number] Attending Physician: Audria Nine, DO Primary Care Physician: Susy Frizzle, MD Admit Date: 03/29/2021  Reason for Consultation/Follow-up: Establishing goals of care  Subjective: Noted extubation was attempted today, however, patient quickly deteriorated and was reintubated. Discussed with PCCM. They noted patient unable to control secretions or food. Copious secretions and old food noted in oropharynx and vocal cord folds while intubating.  I visited with patient later in the day after his extubation. He was awake and alert. He told med to "take the tube out". I discussed with him that he cannot breathe without the tube. There is an option to take the tube out, and ensure his comfort- but this would mean that he would be facing end of life. He became anxious with this discussion mouthing, "I don't want to die".  I attempted to call his daughter, Jeani Hawking for further discussion. Voicemail was left.   ROS  Length of Stay: 12  Current Medications: Scheduled Meds:   ALPRAZolam  0.5 mg Per Tube TID   amLODipine  10 mg Per Tube Daily   arformoterol  15 mcg Nebulization BID   bethanechol  10 mg Per Tube TID   budesonide (PULMICORT) nebulizer solution  0.5 mg Nebulization BID   carvedilol  3.125 mg Per Tube BID WC   chlorhexidine gluconate (MEDLINE KIT)  15 mL Mouth Rinse BID   Chlorhexidine Gluconate Cloth  6 each Topical Daily   docusate  100 mg Per Tube BID   feeding supplement (PROSource TF)  45 mL Per Tube TID   FLUoxetine  20 mg Per Tube Daily   free water  75 mL Per Tube Q4H   guaiFENesin  200 mg Per Tube Q4H   heparin  5,000 Units Subcutaneous Q8H   insulin  aspart  0-6 Units Subcutaneous Q4H   ipratropium  2.5 mL Inhalation TID   loratadine  10 mg Per Tube Daily   mouth rinse  15 mL Mouth Rinse 10 times per day   milk and molasses  1 enema Rectal Once   pantoprazole sodium  40 mg Per Tube Daily   polyethylene glycol  17 g Per Tube BID   [START ON 04/11/2021]  predniSONE  40 mg Oral Q breakfast   Followed by   Derrill Memo ON 04/13/2021] predniSONE  20 mg Oral Q breakfast   Followed by   Derrill Memo ON 04/15/2021] predniSONE  10 mg Oral Q breakfast   senna-docusate  2 tablet Per Tube BID   simvastatin  20 mg Per Tube QHS    Continuous Infusions:  sodium chloride Stopped (04/09/21 0039)   feeding supplement (OSMOLITE 1.5 CAL) 1,000 mL (04/09/21 2101)   fentaNYL infusion INTRAVENOUS Stopped (04/10/21 0918)   norepinephrine (LEVOPHED) Adult infusion Stopped (04/08/21 0301)    PRN Meds: alum & mag hydroxide-simeth, carisoprodol, fentaNYL, hydrALAZINE, metoCLOPramide (REGLAN) injection, ondansetron (ZOFRAN) IV, phenol  Physical Exam Vitals and nursing note reviewed.  Constitutional:      Comments: Frail, cachetic  Psychiatric:     Comments: Anxious at times            Vital Signs: BP 102/64   Pulse 77   Temp 98.1 F (36.7 C) (Axillary)   Resp (!) 25   Ht _0  (1.753 m)   Wt 66.1 kg   SpO2 93%   BMI 21.52 kg/m  SpO2: SpO2: 93 % O2 Device: O2 Device: Ventilator O2 Flow Rate: O2 Flow Rate (L/min): 500 L/min  Intake/output summary:  Intake/Output Summary (Last 24 hours) at 04/10/2021 1507 Last data filed at 04/10/2021 1251 Gross per 24 hour  Intake 1474.08 ml  Output 1400 ml  Net 74.08 ml   LBM: Last BM Date: 03/31/21 Baseline Weight: Weight: 71 kg Most recent weight: Weight: 66.1 kg       Palliative Assessment/Data: PPS: 10%      Patient Active Problem List   Diagnosis Date Noted   Dysphagia 03/31/2021   Sepsis ([place]) 03/30/2021   Acute respiratory failure ([place]) 03/30/2021   GAD (generalized anxiety disorder) 03/30/2021    Community acquired pneumonia 03/29/2021   LFT elevation    Acute kidney injury ([place])    Grief at loss of child 08/20/2012   Healthcare maintenance 11/29/2011   TOBACCO ABUSE 11/23/2009   Dyslipidemia 05/01/2009   Essential hypertension 05/01/2009   CARDIOMYOPATHY 05/01/2009    Palliative Care Assessment & Plan   Patient Profile: 75 y.o. male  with past medical history of grade I diastolic heart failure, COPD, Covid 2020, s/p L nephrectomy, s/p splenectomy, HTN, dysphagia  admitted on 03/29/2021 with respiratory distress. Workup reveals pnuemonia- community acquired. He has required bipap to maintain saturations but did not want to wear it today due to it being uncomfortable. He is requiring intermittent NRB and heated high flow oxygen. Palliative medicine consulted due to patient declining to use bipap and determine how aggressive patient would like his medical care to be.   Assessment/Recommendations/Plan  Failed attempt at extubation today Very anxious with any goals of care discussion- recommending all [place] discussion be completed with his daughter- Jeani Hawking- I attempted to call her today but received no answer and no opportunity for voicemail On initial consult when patient was awake, alert, oriented he expressed that his desire would be to live independently and return home to go fishing- if this could not happen then he would not want to continue on- he agreed with DNR but would be ok with intubation if it was temporary (this discussion was had in the presence of daughter Jeani Hawking). However, code status was later changed by attending team- I suspect at the request of Jeani Hawking who feared that DNR meant "do nothing" even after education regarding code status Appears at this  point he has poor prognosis for liberating from vent- need to discuss trach/PEG, long term ventilation vs comfort, DNR- I anticipate this will be a challenging discussion with his daughter Patient has panic disorder and significant  anxiety related to death and dying after losing his son and his wife- he is on xanax 0.58m TID per tube- he may benefit from added prn dosing- will add lorazepam [id number] prn for increased agitation and anxiety  Goals of Care and Additional Recommendations: Limitations on Scope of Treatment: Full Scope Treatment  Code Status: Full code  Prognosis:  Unable to determine  Discharge Planning: To Be Determined  Care plan was discussed with patient and care team  Thank you for allowing the Palliative Medicine Team to assist in the care of this patient.   Total time: 49 minutes Greater than 50%  of this time was spent counseling and coordinating care related to the above assessment and plan.  KaMariana KaufmanAGNP-C Palliative Medicine   Please contact Palliative Medicine Team phone at [phone] questions and concerns.

## 2021-04-10 NOTE — Procedures (Signed)
Extubation Procedure Note  Patient Details:   Name: Kristopher [name] DOB: 01-Jan-1946 MRN: [id number]   Airway Documentation:    Vent end date: 04/10/21 Vent end time: 1102   Evaluation  O2 sats: transiently fell during during procedure Complications: Complications of Pt unable to cough of secretions, sats in the 70's. Patient did not tolerate procedure well. Rhonchi breath sounds.    No  Pt extubated to 5L [place]. Pt had positive cuff leak. Right after extubation sats dropped in the 70's immediately placed on NRB. RT NT suctioned pt due to unable to cough up secretions at this time. MD aware and at bedside.   Tobi Bastos 04/10/2021, 11:03 AM

## 2021-04-10 NOTE — Progress Notes (Signed)
PT Cancellation Note  Patient Details Name: Kristopher [name] MRN: [id number] DOB: 22-Feb-1946   Cancelled Treatment:    Reason Eval/Treat Not Completed: Medical issues which prohibited therapy  Spoke with RN. Pt currently weaning and hopefully will extubate today. Treatment deferred at this time.    Arby Barrette, PT Pager [phone]   Rexanne Mano 04/10/2021, 9:39 AM

## 2021-04-10 NOTE — Progress Notes (Signed)
Tube repositioned per MD from 28 to 25 cm at the lip.

## 2021-04-10 NOTE — Progress Notes (Signed)
OT Cancellation Note  Patient Details Name: Kristopher [name] MRN: [id number] DOB: 1946/09/30   Cancelled Treatment:    Reason Eval/Treat Not Completed: Medical issues which prohibited therapy. Patient extubated; did not tolerate procedure well with SpO2 dropping to 70's on 5L O2 via [place]. Patient placed on NRB then subsequently reintubated.   Gloris Manchester OTR/L Supplemental OT, Department of rehab services [phone]  Steffie Waggoner R H. 04/10/2021, 11:29 AM

## 2021-04-11 ENCOUNTER — Inpatient Hospital Stay (HOSPITAL_COMMUNITY): Payer: Medicare Other

## 2021-04-11 LAB — CBC WITH DIFFERENTIAL/PLATELET
Abs Immature Granulocytes: 0 10*3/uL (ref 0.00–0.07)
Basophils Absolute: 0 10*3/uL (ref 0.0–0.1)
Basophils Relative: 0 %
Eosinophils Absolute: 0 10*3/uL (ref 0.0–0.5)
Eosinophils Relative: 0 %
HCT: 47.2 % (ref 39.0–52.0)
Hemoglobin: 15.1 g/dL (ref 13.0–17.0)
Lymphocytes Relative: 0 %
Lymphs Abs: 0 10*3/uL — ABNORMAL LOW (ref 0.7–4.0)
MCH: 28.5 pg (ref 26.0–34.0)
MCHC: 32 g/dL (ref 30.0–36.0)
MCV: 89.1 fL (ref 80.0–100.0)
Monocytes Absolute: 0.8 10*3/uL (ref 0.1–1.0)
Monocytes Relative: 3 %
Neutro Abs: 26.8 10*3/uL — ABNORMAL HIGH (ref 1.7–7.7)
Neutrophils Relative %: 97 %
Platelets: 277 10*3/uL (ref 150–400)
RBC: 5.3 MIL/uL (ref 4.22–5.81)
RDW: 15.6 % — ABNORMAL HIGH (ref 11.5–15.5)
WBC: 27.6 10*3/uL — ABNORMAL HIGH (ref 4.0–10.5)
nRBC: 0 % (ref 0.0–0.2)
nRBC: 0 /100 WBC

## 2021-04-11 LAB — CBC
HCT: 45.8 % (ref 39.0–52.0)
Hemoglobin: 14.9 g/dL (ref 13.0–17.0)
MCH: 28.8 pg (ref 26.0–34.0)
MCHC: 32.5 g/dL (ref 30.0–36.0)
MCV: 88.6 fL (ref 80.0–100.0)
Platelets: 286 10*3/uL (ref 150–400)
RBC: 5.17 MIL/uL (ref 4.22–5.81)
RDW: 15.6 % — ABNORMAL HIGH (ref 11.5–15.5)
WBC: 22.1 10*3/uL — ABNORMAL HIGH (ref 4.0–10.5)
nRBC: 0 % (ref 0.0–0.2)

## 2021-04-11 LAB — BASIC METABOLIC PANEL
Anion gap: 6 (ref 5–15)
BUN: 76 mg/dL — ABNORMAL HIGH (ref 8–23)
CO2: 34 mmol/L — ABNORMAL HIGH (ref 22–32)
Calcium: 8.3 mg/dL — ABNORMAL LOW (ref 8.9–10.3)
Chloride: 103 mmol/L (ref 98–111)
Creatinine, Ser: 1.4 mg/dL — ABNORMAL HIGH (ref 0.61–1.24)
GFR, Estimated: 52 mL/min — ABNORMAL LOW (ref >60)
Glucose, Bld: 204 mg/dL — ABNORMAL HIGH (ref 70–99)
Potassium: 5 mmol/L (ref 3.5–5.1)
Sodium: 143 mmol/L (ref 135–145)

## 2021-04-11 LAB — MAGNESIUM: Magnesium: 3.1 mg/dL — ABNORMAL HIGH (ref 1.7–2.4)

## 2021-04-11 LAB — PROCALCITONIN: Procalcitonin: 0.13 ng/mL

## 2021-04-11 LAB — GLUCOSE, CAPILLARY
Glucose-Capillary: 147 mg/dL — ABNORMAL HIGH (ref 70–99)
Glucose-Capillary: 172 mg/dL — ABNORMAL HIGH (ref 70–99)
Glucose-Capillary: 194 mg/dL — ABNORMAL HIGH (ref 70–99)
Glucose-Capillary: 242 mg/dL — ABNORMAL HIGH (ref 70–99)
Glucose-Capillary: 219 mg/dL — ABNORMAL HIGH (ref 70–99)
Glucose-Capillary: 182 mg/dL — ABNORMAL HIGH (ref 70–99)

## 2021-04-11 MED ORDER — FENTANYL BOLUS VIA INFUSION
25.0000 ug | INTRAVENOUS | Status: DC | PRN
  Administered 2021-04-12 (×2): 50 ug via INTRAVENOUS
  Administered 2021-04-12: 25 ug via INTRAVENOUS
  Administered 2021-04-12: 50 ug via INTRAVENOUS
  Administered 2021-04-12: 25 ug via INTRAVENOUS
  Administered 2021-04-12 – 2021-04-16 (×22): 50 ug via INTRAVENOUS
  Filled 2021-04-11: qty 50

## 2021-04-11 MED ORDER — NOREPINEPHRINE 4 MG/250ML-% IV SOLN: 0.0000 ug/min | INTRAVENOUS | Status: DC

## 2021-04-11 MED ORDER — LORAZEPAM 2 MG/ML IJ SOLN
0.5000 mg | Freq: Four times a day (QID) | INTRAMUSCULAR | Status: DC | PRN
  Administered 2021-04-11 – 2021-04-17 (×8): 0.5 mg via INTRAVENOUS
  Filled 2021-04-11 (×10): qty 1

## 2021-04-11 MED ORDER — SODIUM CHLORIDE 0.9 % IV SOLN
2.0000 g | Freq: Two times a day (BID) | INTRAVENOUS | Status: AC
  Administered 2021-04-11 – 2021-04-17 (×13): 2 g via INTRAVENOUS
  Filled 2021-04-11 (×13): qty 2

## 2021-04-11 MED ORDER — PREDNISONE 20 MG PO TABS
20.0000 mg | ORAL_TABLET | Freq: Every day | ORAL | Status: AC
  Administered 2021-04-13 – 2021-04-14 (×2): 20 mg
  Filled 2021-04-11 (×2): qty 1

## 2021-04-11 MED ORDER — PREDNISONE 10 MG PO TABS
10.0000 mg | ORAL_TABLET | Freq: Every day | ORAL | Status: AC
  Administered 2021-04-15: 10 mg
  Filled 2021-04-11: qty 1

## 2021-04-11 MED ORDER — PREDNISONE 20 MG PO TABS
40.0000 mg | ORAL_TABLET | Freq: Every day | ORAL | Status: AC
  Administered 2021-04-11 – 2021-04-12 (×2): 40 mg
  Filled 2021-04-11: qty 2

## 2021-04-11 MED ORDER — VANCOMYCIN HCL 1000 MG/200ML IV SOLN
1000.0000 mg | INTRAVENOUS | Status: DC
  Administered 2021-04-11 – 2021-04-12 (×2): 1000 mg via INTRAVENOUS
  Filled 2021-04-11 (×3): qty 200

## 2021-04-11 MED ORDER — GERHARDT'S BUTT CREAM
1.0000 "application " | TOPICAL_CREAM | CUTANEOUS | Status: DC | PRN
  Administered 2021-04-11: 1 via TOPICAL
  Filled 2021-04-11: qty 1

## 2021-04-11 NOTE — Progress Notes (Signed)
OT Cancellation Note  Patient Details Name: FREDIRICK BUTTON MRN: [id number] DOB: 1946-06-27   Cancelled Treatment:    Reason Eval/Treat Not Completed: Medical issues which prohibited therapy. Patient intubated and sedated. OT to hold this date per RN request. Plan for trach placement.   Gloris Manchester OTR/L Supplemental OT, Department of rehab services [phone]  Elton Heid R H. 04/11/2021, 1:19 PM

## 2021-04-11 NOTE — Progress Notes (Signed)
Pharmacy Antibiotic Note  Kristopher [name] is a 75 y.o. male admitted on 03/29/2021 with pneumonia.  Pharmacy has been consulted for Vancomycin and Cefepime dosing.   Patient with purulent secretions and trach aspirate with gram positives in the gram stain, culture still pending. Attempted extubation on 6/29 but had to be quickly re-intubated with inability to clear secretions. WBC remains elevated (slightly down at 22.1). Last dose of Vancomycin was on 6/27 at 0043 AM. SCr 1.40 -elevated but stable for 4 days. Improved urine output charted after start of bethanechol for urinary retention.   Plan: Vancomycin 1000 mg IV every 24 hours.  Goal AUC 400-550. Expected AUC: 528 SCr used: 1.40 Vancomycin levels as indicated Cefepime 2 g IV every 12 hours.  Monitor renal function, PCT, culture results, and clinical status.   Height: '5\' 9"'$  (175.3 cm) Weight: 66.1 kg (145 lb 11.6 oz) IBW/kg (Calculated) : 70.7  Temp (24hrs), Avg:97.9 F (36.6 C), Min:97.6 F (36.4 C), Max:98.3 F (36.8 C)  Recent Labs  Lab 04/07/21 2118 04/08/21 0336 04/08/21 1223 04/09/21 0511 04/10/21 0811 04/11/21 0108  WBC 27.2* 26.6*  --  24.7* 25.4* 22.1*  CREATININE 1.37* 1.50* 1.42* 1.48* 1.39* 1.40*    Estimated Creatinine Clearance: 42.6 mL/min (A) (by C-G formula based on SCr of 1.4 mg/dL (H)).    No Known Allergies  Antimicrobials this admission: 6/17 Cefepime>> 6/24 6/16 Azith >> 6/21 Vanc 6/26>>6/27 Zosyn 6/26>>6/27  Dose adjustments this admission:   Microbiology results: 6/17 Ucx >> staph epi,2000 colonies, pan sens. 6/17 Bcx >> negative 6/26 MRSA PCR - negative 6/26 TA >> normal flora 6/26 Bld Cxs >> 6/29 TA >> reincubated (Gm stain with GPC pairs/clusters, GPRs)  Thank you for allowing pharmacy to be a part of this patient's care.  Sloan Leiter, PharmD, BCPS, BCCCP Clinical Pharmacist Please refer to [facility] for [place] numbers 04/11/2021 1:23 PM
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
## 2021-04-11 NOTE — Progress Notes (Signed)
[facility] Progress Note Patient Name: Kristopher [name] DOB: 1946-06-06 MRN: [id number]   Date of Service  04/11/2021  HPI/Events of Note  Patient with urinary retention, bladder scan positive for > 500 ml of urine despite in / out cath's x 3.  eICU Interventions  Order for Foley catheter entered.        Kerry Kass Joandy Burget 04/11/2021, 6:29 AM

## 2021-04-11 NOTE — Progress Notes (Signed)
Nutrition Follow-up  DOCUMENTATION CODES:   Severe malnutrition in context of chronic illness  INTERVENTION:   Continue tube feeds via OG tube: - Osmolite 1.5 @ 50 ml/hr (1200 ml/day) - ProSource TF 45 ml TID - Free water flushes of 75 ml q 4 hours, MD to adjust as needed  Tube feeding regimen provides 1920 kcal, 108 grams of protein, and 914 ml of H2O.   Total free water with flushes: 1364 ml  NUTRITION DIAGNOSIS:   Severe Malnutrition related to chronic illness as evidenced by severe fat depletion, severe muscle depletion.  New diagnosis after completion of NFPE  GOAL:   Patient will meet greater than or equal to 90% of their needs  Met via TF  MONITOR:   Vent status, Labs, Weight trends, TF tolerance, I & O's  REASON FOR ASSESSMENT:   Consult Enteral/tube feeding initiation and management  ASSESSMENT:   75 year old male presented to ED from PCP office with respiratory distress worsening over the last 2-3 days. At PCP, noted to be febrile (101.4) and hypoxic with O2 saturation of 76%. EMS was called to transport to ED. PMH relevant for dyslipidemia, DM type 2, cardiomyopathy, anxiety/depression, HTN, COVID19 infection 2020  6/17 - admitted with acute respiratory failure 6/21 - MBS completed, regular diet with nectar-thick liquids recommended by SLP 6/22 - SLP downgraded diet to dysphagia 2 with nectar-thick liquids 6/26 - aspiration event leading to intubation and bronchoscopy for removal of food from airway 6/29 - extubated, later reintubated due to inability to manage secretions  Discussed pt with RN. Pt tolerating tube feeds without issue. Bowel movement documented for 6/29 but unsure of amount or type of BM. Abdomen distended at time of RD visit.  RD was able to complete NFPE. Pt meets criteria for severe malnutrition.  Noted palliative care team following regarding [place]. Pt failed extubation yesterday and will likely need trach per CCM.  Admit weight: 71  kg Current weight: 66.1 kg  Patient is currently intubated on ventilator support MV: 13.7 L/min Temp (24hrs), Avg:97.9 F (36.6 C), Min:97.6 F (36.4 C), Max:98.3 F (36.8 C)  Drips: Fentanyl  Medications reviewed and include: colace, SSI q 4 hours, protonix, miralax, prednisone taper, senna  Labs reviewed: BUN 76, creatinine 1.40, magnesium 3.1 CBG's: 147-202 x 24 hours  UOP: 2150 ml x 24 hours I/O's: -2.0 L since admit  NUTRITION - FOCUSED PHYSICAL EXAM:  Flowsheet Row Most Recent Value  Orbital Region Severe depletion  Upper Arm Region Moderate depletion  Thoracic and Lumbar Region Severe depletion  Buccal Region Unable to assess  Temple Region Severe depletion  Clavicle Bone Region Severe depletion  Clavicle and Acromion Bone Region Severe depletion  Scapular Bone Region Severe depletion  Dorsal Hand Severe depletion  Patellar Region Moderate depletion  Anterior Thigh Region Moderate depletion  Posterior Calf Region Moderate depletion  Edema (RD Assessment) None  Hair Reviewed  Eyes Reviewed  Mouth Reviewed  Skin Reviewed  Nails Reviewed       Diet Order:   Diet Order             Diet NPO time specified  Diet effective now                   EDUCATION NEEDS:   Not appropriate for education at this time  Skin:  Skin Assessment: Reviewed RN Assessment  Last BM:  04/10/21  Height:   Ht Readings from Last 1 Encounters:  04/08/21 5' 9"  (1.753 m)  Weight:   Wt Readings from Last 1 Encounters:  04/11/21 66.1 kg    Ideal Body Weight:  72.7 kg  BMI:  Body mass index is 21.52 kg/m.  Estimated Nutritional Needs:   Kcal:  1800-2000 kcal/d  Protein:  100-115 g/d  Fluid:  1.8-2L/d    Gustavus Bryant, MS, RD, LDN Inpatient Clinical Dietitian Please see [facility] for contact information.

## 2021-04-11 NOTE — Progress Notes (Signed)
PT Cancellation Note  Patient Details Name: Kristopher [name] MRN: [id number] DOB: 1946-01-28   Cancelled Treatment:    Reason Eval/Treat Not Completed: Medical issues which prohibited therapy  Pt with failed attempt to extubated yesterday and currently on vent at 75% FiO2.  Spoke with RN who reports pt for possible trach and not stable for therapy at this time.  PT agrees.  Will f/u as able.  Abran Richard, PT Acute Rehab Services Pager [phone] [facility] 04/11/2021, 1:20 PM

## 2021-04-12 ENCOUNTER — Inpatient Hospital Stay (HOSPITAL_COMMUNITY): Payer: Medicare Other

## 2021-04-12 DIAGNOSIS — E43 Unspecified severe protein-calorie malnutrition: Secondary | ICD-10-CM | POA: Insufficient documentation

## 2021-04-12 DIAGNOSIS — J9600 Acute respiratory failure, unspecified whether with hypoxia or hypercapnia: Secondary | ICD-10-CM

## 2021-04-12 LAB — CBC
HCT: 47.6 % (ref 39.0–52.0)
HCT: 46.3 % (ref 39.0–52.0)
Hemoglobin: 14.8 g/dL (ref 13.0–17.0)
Hemoglobin: 14.6 g/dL (ref 13.0–17.0)
MCH: 28.4 pg (ref 26.0–34.0)
MCH: 28.4 pg (ref 26.0–34.0)
MCHC: 31.1 g/dL (ref 30.0–36.0)
MCHC: 31.5 g/dL (ref 30.0–36.0)
MCV: 91.2 fL (ref 80.0–100.0)
MCV: 90.1 fL (ref 80.0–100.0)
Platelets: 262 10*3/uL (ref 150–400)
Platelets: 278 10*3/uL (ref 150–400)
RBC: 5.22 MIL/uL (ref 4.22–5.81)
RBC: 5.14 MIL/uL (ref 4.22–5.81)
RDW: 16 % — ABNORMAL HIGH (ref 11.5–15.5)
RDW: 15.9 % — ABNORMAL HIGH (ref 11.5–15.5)
WBC: 27.8 10*3/uL — ABNORMAL HIGH (ref 4.0–10.5)
WBC: 29.7 10*3/uL — ABNORMAL HIGH (ref 4.0–10.5)
nRBC: 0 % (ref 0.0–0.2)
nRBC: 0 % (ref 0.0–0.2)

## 2021-04-12 LAB — GLUCOSE, CAPILLARY
Glucose-Capillary: 144 mg/dL — ABNORMAL HIGH (ref 70–99)
Glucose-Capillary: 175 mg/dL — ABNORMAL HIGH (ref 70–99)
Glucose-Capillary: 196 mg/dL — ABNORMAL HIGH (ref 70–99)
Glucose-Capillary: 232 mg/dL — ABNORMAL HIGH (ref 70–99)
Glucose-Capillary: 190 mg/dL — ABNORMAL HIGH (ref 70–99)

## 2021-04-12 LAB — PROCALCITONIN: Procalcitonin: 0.16 ng/mL

## 2021-04-12 LAB — CULTURE, BLOOD (ROUTINE X 2)
Culture: NO GROWTH
Culture: NO GROWTH

## 2021-04-12 LAB — CULTURE, RESPIRATORY W GRAM STAIN: Culture: NORMAL

## 2021-04-12 LAB — BASIC METABOLIC PANEL
Anion gap: 7 (ref 5–15)
BUN: 73 mg/dL — ABNORMAL HIGH (ref 8–23)
CO2: 34 mmol/L — ABNORMAL HIGH (ref 22–32)
Calcium: 8.4 mg/dL — ABNORMAL LOW (ref 8.9–10.3)
Chloride: 104 mmol/L (ref 98–111)
Creatinine, Ser: 1.32 mg/dL — ABNORMAL HIGH (ref 0.61–1.24)
GFR, Estimated: 56 mL/min — ABNORMAL LOW (ref >60)
Glucose, Bld: 141 mg/dL — ABNORMAL HIGH (ref 70–99)
Potassium: 5.1 mmol/L (ref 3.5–5.1)
Sodium: 145 mmol/L (ref 135–145)

## 2021-04-12 LAB — MAGNESIUM: Magnesium: 2.8 mg/dL — ABNORMAL HIGH (ref 1.7–2.4)

## 2021-04-12 MED ORDER — FREE WATER
150.0000 mL | Status: DC
  Administered 2021-04-12 – 2021-04-17 (×33): 150 mL

## 2021-04-12 MED ORDER — FUROSEMIDE 10 MG/ML IJ SOLN
20.0000 mg | Freq: Once | INTRAMUSCULAR | Status: AC
  Administered 2021-04-12: 20 mg via INTRAVENOUS
  Filled 2021-04-12: qty 2

## 2021-04-12 MED ORDER — SODIUM CHLORIDE 3 % IN NEBU
4.0000 mL | INHALATION_SOLUTION | Freq: Two times a day (BID) | RESPIRATORY_TRACT | Status: AC
  Administered 2021-04-12 – 2021-04-14 (×5): 4 mL via RESPIRATORY_TRACT
  Filled 2021-04-12 (×6): qty 4

## 2021-04-12 MED ORDER — GUAIFENESIN 100 MG/5ML PO SOLN
10.0000 mL | ORAL | Status: DC
  Administered 2021-04-12 – 2021-04-17 (×32): 200 mg
  Filled 2021-04-12 (×16): qty 10
  Filled 2021-04-12 (×2): qty 5
  Filled 2021-04-12 (×3): qty 10
  Filled 2021-04-12: qty 5
  Filled 2021-04-12 (×5): qty 10
  Filled 2021-04-12: qty 5
  Filled 2021-04-12 (×7): qty 10

## 2021-04-12 MED ORDER — BISACODYL 10 MG RE SUPP
10.0000 mg | Freq: Once | RECTAL | Status: AC
  Administered 2021-04-12: 10 mg via RECTAL
  Filled 2021-04-12: qty 1

## 2021-04-12 NOTE — Progress Notes (Signed)
PT Cancellation Note  Patient Details Name: RHYLEN KIMMES MRN: [id number] DOB: 16-Mar-1946   Cancelled Treatment:    Reason Eval/Treat Not Completed: Other (comment); patient seen by OT this am who reports pt unable to follow simple one step commands.  Noted family here to discuss with MD [phone].  Will attempt again another day.  Magda Kiel, [place] [id number] [phone] 04/12/2021  Reginia Naas 04/12/2021, 12:10 PM
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
## 2021-04-13 ENCOUNTER — Inpatient Hospital Stay (HOSPITAL_COMMUNITY): Payer: Medicare Other

## 2021-04-13 DIAGNOSIS — J9600 Acute respiratory failure, unspecified whether with hypoxia or hypercapnia: Secondary | ICD-10-CM | POA: Diagnosis not present

## 2021-04-13 LAB — POCT I-STAT 7, (LYTES, BLD GAS, ICA,H+H)
Acid-Base Excess: 8 mmol/L — ABNORMAL HIGH (ref 0.0–2.0)
Bicarbonate: 35.6 mmol/L — ABNORMAL HIGH (ref 20.0–28.0)
Calcium, Ion: 1.22 mmol/L (ref 1.15–1.40)
HCT: 44 % (ref 39.0–52.0)
Hemoglobin: 15 g/dL (ref 13.0–17.0)
O2 Saturation: 87 %
Patient temperature: 98.6
Potassium: 5 mmol/L (ref 3.5–5.1)
Sodium: 147 mmol/L — ABNORMAL HIGH (ref 135–145)
TCO2: 37 mmol/L — ABNORMAL HIGH (ref 22–32)
pCO2 arterial: 58.4 mmHg — ABNORMAL HIGH (ref 32.0–48.0)
pH, Arterial: 7.394 (ref 7.350–7.450)
pO2, Arterial: 55 mmHg — ABNORMAL LOW (ref 83.0–108.0)

## 2021-04-13 LAB — GLUCOSE, CAPILLARY
Glucose-Capillary: 137 mg/dL — ABNORMAL HIGH (ref 70–99)
Glucose-Capillary: 155 mg/dL — ABNORMAL HIGH (ref 70–99)
Glucose-Capillary: 156 mg/dL — ABNORMAL HIGH (ref 70–99)
Glucose-Capillary: 178 mg/dL — ABNORMAL HIGH (ref 70–99)
Glucose-Capillary: 182 mg/dL — ABNORMAL HIGH (ref 70–99)
Glucose-Capillary: 195 mg/dL — ABNORMAL HIGH (ref 70–99)
Glucose-Capillary: 197 mg/dL — ABNORMAL HIGH (ref 70–99)

## 2021-04-13 LAB — CBC
HCT: 45.3 % (ref 39.0–52.0)
Hemoglobin: 14.3 g/dL (ref 13.0–17.0)
MCH: 28.3 pg (ref 26.0–34.0)
MCHC: 31.6 g/dL (ref 30.0–36.0)
MCV: 89.7 fL (ref 80.0–100.0)
Platelets: 225 10*3/uL (ref 150–400)
RBC: 5.05 MIL/uL (ref 4.22–5.81)
RDW: 16 % — ABNORMAL HIGH (ref 11.5–15.5)
WBC: 24.5 10*3/uL — ABNORMAL HIGH (ref 4.0–10.5)
nRBC: 0 % (ref 0.0–0.2)

## 2021-04-13 LAB — COMPREHENSIVE METABOLIC PANEL
ALT: 26 U/L (ref 0–44)
AST: 13 U/L — ABNORMAL LOW (ref 15–41)
Albumin: 1.9 g/dL — ABNORMAL LOW (ref 3.5–5.0)
Alkaline Phosphatase: 72 U/L (ref 38–126)
Anion gap: 8 (ref 5–15)
BUN: 75 mg/dL — ABNORMAL HIGH (ref 8–23)
CO2: 31 mmol/L (ref 22–32)
Calcium: 8.3 mg/dL — ABNORMAL LOW (ref 8.9–10.3)
Chloride: 106 mmol/L (ref 98–111)
Creatinine, Ser: 1.6 mg/dL — ABNORMAL HIGH (ref 0.61–1.24)
GFR, Estimated: 45 mL/min — ABNORMAL LOW (ref >60)
Glucose, Bld: 153 mg/dL — ABNORMAL HIGH (ref 70–99)
Potassium: 5.1 mmol/L (ref 3.5–5.1)
Sodium: 145 mmol/L (ref 135–145)
Total Bilirubin: 0.6 mg/dL (ref 0.3–1.2)
Total Protein: 6 g/dL — ABNORMAL LOW (ref 6.5–8.1)

## 2021-04-13 LAB — MAGNESIUM: Magnesium: 2.8 mg/dL — ABNORMAL HIGH (ref 1.7–2.4)

## 2021-04-13 LAB — BRAIN NATRIURETIC PEPTIDE: B Natriuretic Peptide: 57.9 pg/mL (ref 0.0–100.0)

## 2021-04-13 LAB — PROCALCITONIN: Procalcitonin: 0.19 ng/mL
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
## 2021-04-13 NOTE — Progress Notes (Addendum)
PT complaining of pain around ETT holder, this RN assessed upper lip and noted the top front teeth pressing against inner upper lip. Moistened gauze roll placed in between upper front teeth and lips. Assisted RT Kayla D with changing ETT holder.

## 2021-04-14 DIAGNOSIS — J9600 Acute respiratory failure, unspecified whether with hypoxia or hypercapnia: Secondary | ICD-10-CM | POA: Diagnosis not present

## 2021-04-14 LAB — GLUCOSE, CAPILLARY
Glucose-Capillary: 140 mg/dL — ABNORMAL HIGH (ref 70–99)
Glucose-Capillary: 161 mg/dL — ABNORMAL HIGH (ref 70–99)
Glucose-Capillary: 142 mg/dL — ABNORMAL HIGH (ref 70–99)
Glucose-Capillary: 202 mg/dL — ABNORMAL HIGH (ref 70–99)
Glucose-Capillary: 170 mg/dL — ABNORMAL HIGH (ref 70–99)
Glucose-Capillary: 162 mg/dL — ABNORMAL HIGH (ref 70–99)

## 2021-04-14 LAB — CBC
HCT: 44.8 % (ref 39.0–52.0)
Hemoglobin: 14.4 g/dL (ref 13.0–17.0)
MCH: 28.7 pg (ref 26.0–34.0)
MCHC: 32.1 g/dL (ref 30.0–36.0)
MCV: 89.4 fL (ref 80.0–100.0)
Platelets: 278 10*3/uL (ref 150–400)
RBC: 5.01 MIL/uL (ref 4.22–5.81)
RDW: 16 % — ABNORMAL HIGH (ref 11.5–15.5)
WBC: 21.4 10*3/uL — ABNORMAL HIGH (ref 4.0–10.5)
nRBC: 0 % (ref 0.0–0.2)

## 2021-04-14 MED ORDER — FUROSEMIDE 10 MG/ML IJ SOLN
40.0000 mg | Freq: Once | INTRAMUSCULAR | Status: AC
  Administered 2021-04-14: 40 mg via INTRAVENOUS
  Filled 2021-04-14: qty 4
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
## 2021-04-15 DIAGNOSIS — Z7189 Other specified counseling: Secondary | ICD-10-CM | POA: Diagnosis not present

## 2021-04-15 DIAGNOSIS — Z515 Encounter for palliative care: Secondary | ICD-10-CM | POA: Diagnosis not present

## 2021-04-15 DIAGNOSIS — J9600 Acute respiratory failure, unspecified whether with hypoxia or hypercapnia: Secondary | ICD-10-CM | POA: Diagnosis not present

## 2021-04-15 DIAGNOSIS — Z9911 Dependence on respirator [ventilator] status: Secondary | ICD-10-CM

## 2021-04-15 DIAGNOSIS — J9601 Acute respiratory failure with hypoxia: Secondary | ICD-10-CM | POA: Diagnosis not present

## 2021-04-15 LAB — BASIC METABOLIC PANEL
Anion gap: 5 (ref 5–15)
BUN: 61 mg/dL — ABNORMAL HIGH (ref 8–23)
CO2: 33 mmol/L — ABNORMAL HIGH (ref 22–32)
Calcium: 8.3 mg/dL — ABNORMAL LOW (ref 8.9–10.3)
Chloride: 105 mmol/L (ref 98–111)
Creatinine, Ser: 1.41 mg/dL — ABNORMAL HIGH (ref 0.61–1.24)
GFR, Estimated: 52 mL/min — ABNORMAL LOW (ref >60)
Glucose, Bld: 146 mg/dL — ABNORMAL HIGH (ref 70–99)
Potassium: 5.2 mmol/L — ABNORMAL HIGH (ref 3.5–5.1)
Sodium: 143 mmol/L (ref 135–145)

## 2021-04-15 LAB — CBC
HCT: 46.6 % (ref 39.0–52.0)
Hemoglobin: 15 g/dL (ref 13.0–17.0)
MCH: 28.5 pg (ref 26.0–34.0)
MCHC: 32.2 g/dL (ref 30.0–36.0)
MCV: 88.6 fL (ref 80.0–100.0)
Platelets: 249 10*3/uL (ref 150–400)
RBC: 5.26 MIL/uL (ref 4.22–5.81)
RDW: 16.1 % — ABNORMAL HIGH (ref 11.5–15.5)
WBC: 15.8 10*3/uL — ABNORMAL HIGH (ref 4.0–10.5)
nRBC: 0.2 % (ref 0.0–0.2)

## 2021-04-15 LAB — GLUCOSE, CAPILLARY
Glucose-Capillary: 136 mg/dL — ABNORMAL HIGH (ref 70–99)
Glucose-Capillary: 168 mg/dL — ABNORMAL HIGH (ref 70–99)
Glucose-Capillary: 182 mg/dL — ABNORMAL HIGH (ref 70–99)
Glucose-Capillary: 183 mg/dL — ABNORMAL HIGH (ref 70–99)
Glucose-Capillary: 155 mg/dL — ABNORMAL HIGH (ref 70–99)
Glucose-Capillary: 85 mg/dL (ref 70–99)

## 2021-04-15 LAB — MAGNESIUM: Magnesium: 2.5 mg/dL — ABNORMAL HIGH (ref 1.7–2.4)

## 2021-04-15 LAB — PHOSPHORUS: Phosphorus: 3.7 mg/dL (ref 2.5–4.6)

## 2021-04-15 MED ORDER — FUROSEMIDE 10 MG/ML IJ SOLN
40.0000 mg | Freq: Once | INTRAMUSCULAR | Status: AC
  Administered 2021-04-15: 40 mg via INTRAVENOUS
  Filled 2021-04-15: qty 4
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
## 2021-04-15 NOTE — Progress Notes (Signed)
Palliative:  HPI: 75 y.o. male  with past medical history of grade I diastolic heart failure, COPD, Covid 2020, s/p L nephrectomy, s/p splenectomy, HTN, dysphagia  admitted on 03/29/2021 with respiratory distress. Workup reveals pnuemonia- community acquired. He has required bipap to maintain saturations but did not want to wear it today due to it being uncomfortable. He is requiring intermittent NRB and heated high flow oxygen. Palliative medicine consulted due to patient declining to use bipap and determine how aggressive patient would like his medical care to be.    I met today along with Sherol Dade, NP with Mr. Renato Battles. We were able to have a conversation with Mr. Amsden and tried to delineate his goals of care. We discussed his difficult road with COPD and pneumonia and his lungs are still very weak. We discussed with him the potential of tracheostomy vs one way extubation moving forward. We explained to him simply and directly what these two paths could look like for him although knowing this could change depending on his progress. He clearly asks how much time he would have to live if we removed ETT and I explained that if we removed this today (clarifying this is not the plan) he would likely have hours to days but I would not anticipate his lungs are strong enough to live much longer. He verifies that he does desire to continue full scope measures to prolong his life. He expresses interest in tracheostomy if needed but does not want to make finite decision today. We explained that today this is just a conversation and that we would be back in the morning to discuss further. He was clear that he did not want [place] to call his family today although we were clear that his son and daughter have been updated. Will try and clarify who he would desire to be his surrogate decision maker tomorrow.   All questions/concerns addressed to best of our ability in setting of intubation and attempts to lip read and  communicate is difficult but possible today. Emotional support provided.   Exam:Alert, appears oriented although unable to fully assess due to ETT. No distress although anxious at times. Breathing regular, unlabored but still on 60% FiO2 and 91% sats. Abd flat. Moves all extremities.   Plan: - Ongoing discussion regarding goals of care. At this time he desires full scope care to prolong his life. Will continue discussions to what this means and looks like so he can make informed choices.   [place], NP Palliative Medicine Team Pager [phone] (Please see [email or web address] for schedule) Team Phone [phone]    Greater than 50%  of this time was spent counseling and coordinating care related to the above assessment and plan

## 2021-04-15 NOTE — Progress Notes (Signed)
SLP Cancellation Note  Patient Details Name: PARDEEP ARCOS MRN: [id number] DOB: 09-28-1946   Cancelled treatment:       Reason Eval/Treat Not Completed: Patient not medically ready (Pt is currently on the vent. SLP will f/u)  Tobie Poet I. Hardin Negus, [place], [place] Office number [phone] Pager [phone]  Horton Marshall 04/15/2021, 7:56 AM

## 2021-04-16 DIAGNOSIS — R1310 Dysphagia, unspecified: Secondary | ICD-10-CM | POA: Diagnosis not present

## 2021-04-16 DIAGNOSIS — Z7189 Other specified counseling: Secondary | ICD-10-CM | POA: Diagnosis not present

## 2021-04-16 DIAGNOSIS — E43 Unspecified severe protein-calorie malnutrition: Secondary | ICD-10-CM

## 2021-04-16 DIAGNOSIS — Z515 Encounter for palliative care: Secondary | ICD-10-CM | POA: Diagnosis not present

## 2021-04-16 DIAGNOSIS — N179 Acute kidney failure, unspecified: Secondary | ICD-10-CM | POA: Diagnosis not present

## 2021-04-16 DIAGNOSIS — J9601 Acute respiratory failure with hypoxia: Secondary | ICD-10-CM | POA: Diagnosis not present

## 2021-04-16 LAB — BASIC METABOLIC PANEL
Anion gap: 7 (ref 5–15)
BUN: 60 mg/dL — ABNORMAL HIGH (ref 8–23)
CO2: 32 mmol/L (ref 22–32)
Calcium: 8.2 mg/dL — ABNORMAL LOW (ref 8.9–10.3)
Chloride: 103 mmol/L (ref 98–111)
Creatinine, Ser: 1.51 mg/dL — ABNORMAL HIGH (ref 0.61–1.24)
GFR, Estimated: 48 mL/min — ABNORMAL LOW (ref >60)
Glucose, Bld: 139 mg/dL — ABNORMAL HIGH (ref 70–99)
Potassium: 4.6 mmol/L (ref 3.5–5.1)
Sodium: 142 mmol/L (ref 135–145)

## 2021-04-16 LAB — GLUCOSE, CAPILLARY
Glucose-Capillary: 156 mg/dL — ABNORMAL HIGH (ref 70–99)
Glucose-Capillary: 178 mg/dL — ABNORMAL HIGH (ref 70–99)
Glucose-Capillary: 146 mg/dL — ABNORMAL HIGH (ref 70–99)
Glucose-Capillary: 158 mg/dL — ABNORMAL HIGH (ref 70–99)
Glucose-Capillary: 158 mg/dL — ABNORMAL HIGH (ref 70–99)
Glucose-Capillary: 116 mg/dL — ABNORMAL HIGH (ref 70–99)

## 2021-04-16 LAB — CBC
HCT: 44.4 % (ref 39.0–52.0)
Hemoglobin: 14.6 g/dL (ref 13.0–17.0)
MCH: 28.8 pg (ref 26.0–34.0)
MCHC: 32.9 g/dL (ref 30.0–36.0)
MCV: 87.6 fL (ref 80.0–100.0)
Platelets: 255 10*3/uL (ref 150–400)
RBC: 5.07 MIL/uL (ref 4.22–5.81)
RDW: 15.9 % — ABNORMAL HIGH (ref 11.5–15.5)
WBC: 16.4 10*3/uL — ABNORMAL HIGH (ref 4.0–10.5)
nRBC: 0.2 % (ref 0.0–0.2)

## 2021-04-16 LAB — MAGNESIUM: Magnesium: 2.3 mg/dL (ref 1.7–2.4)

## 2021-04-16 LAB — PHOSPHORUS: Phosphorus: 3.8 mg/dL (ref 2.5–4.6)
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
## 2021-04-16 NOTE — Progress Notes (Signed)
SLP Cancellation Note  Patient Details Name: Kristopher [name] MRN: [id number] DOB: 1946/10/01   Cancelled treatment:       Reason Eval/Treat Not Completed: Medical issues which prohibited therapy (remains on the vent this am). Will continue to follow.   Osie Bond., M.A. [facility] Pager [phone] Office [phone]  04/16/2021, 8:10 AM
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
## 2021-04-16 NOTE — Progress Notes (Signed)
Updated patient's son Kristopher [name] about ongoing care  He is hopeful that we may be able to get him off the ventilator and not require tracheostomy but he is agreeable to tracheostomy if needed  Consult for [facility] also discussed with him

## 2021-04-16 NOTE — Progress Notes (Signed)
Palliative:  HPI: 75 y.o. male  with past medical history of grade I diastolic heart failure, COPD, Covid 2020, s/p L nephrectomy, s/p splenectomy, HTN, dysphagia  admitted on 03/29/2021 with respiratory distress. Workup reveals pnuemonia- community acquired. He has required bipap to maintain saturations but did not want to wear it today due to it being uncomfortable. He is requiring intermittent NRB and heated high flow oxygen. Palliative medicine consulted due to patient declining to use bipap and determine how aggressive patient would like his medical care to be. He has since required mechanical ventilation beginning 04/07/21 and brief extubation but reintubated same day on 04/10/21 and poor weaning since.   I met today at Mr. Geiler's bedside along with Jordan Hawks, NP. We attempted to further discuss goals of care with Mr. Trethewey but he is more lethargic or sedated today and struggling to try and stay away for conversation. We reassured him and left him to rest. We did feel that he was clear in his desire to live and pursue full scope treatment to give him more time but also seemed aware that this may not be a quality of life he would want long term. Unable to progress conversation today. He does seem to indicate that his son, Cristie Hem, is who he trusts to make decisions on his behalf.   We also called and spoke with son, Cristie Hem. We discussed with Cristie Hem our visit with his father yesterday and today and the differences in his ability to communicate. Cristie Hem has had communication with the team and speak about LTACH and hopes for transition to [facility] with hopes they can help to wean him off ventilator. We discussed that this will be important but we first need to discuss the likely need for tracheostomy and what this could potentially look like and mean for his father and his quality of life. Cristie Hem has good understanding and although he is very hopeful that his father can ultimately wean from ventilator he also is  understanding of poor quality of life if this does not happen (living in a facility, mostly bed bound, relying on strangers for all aspects of care). Alex very much wants to ensure that he is following his father's wishes and desires and open to further conversation to ensure we all continue to be aligned in goals. For now all are on board for tracheostomy if needed but hopeful to avoid (I did shares with Cristie Hem that tracheostomy will very likely be needed).   All questions/concerns addressed. Emotional support provided.   Exam: More lethargic today. Attempts to open eyes but unable to hold open. He does nod head a few times but drifting back to sleep. FiO2 50% tolerating vent.   Plan: - Pursue trach if indicated but they want to continue to optimize to give every opportunity to extubate successfully.   34 min  Vinie Sill, NP Palliative Medicine Team Pager [phone] (Please see [email or web address] for schedule) Team Phone [phone]    Greater than 50%  of this time was spent counseling and coordinating care related to the above assessment and plan

## 2021-04-17 ENCOUNTER — Inpatient Hospital Stay (HOSPITAL_COMMUNITY): Payer: Medicare Other

## 2021-04-17 ENCOUNTER — Inpatient Hospital Stay (HOSPITAL_COMMUNITY)
Admission: RE | Admit: 2021-04-17 | Discharge: 2021-04-22 | Disposition: A | Discharge status: Still In Hospital | Payer: Medicare Other | Source: Ambulatory Visit | Attending: Internal Medicine | Admitting: Internal Medicine

## 2021-04-17 ENCOUNTER — Other Ambulatory Visit (HOSPITAL_COMMUNITY): Payer: Medicare Other

## 2021-04-17 DIAGNOSIS — J449 Chronic obstructive pulmonary disease, unspecified: Secondary | ICD-10-CM

## 2021-04-17 DIAGNOSIS — N289 Disorder of kidney and ureter, unspecified: Secondary | ICD-10-CM

## 2021-04-17 DIAGNOSIS — J690 Pneumonitis due to inhalation of food and vomit: Secondary | ICD-10-CM

## 2021-04-17 DIAGNOSIS — I5032 Chronic diastolic (congestive) heart failure: Secondary | ICD-10-CM

## 2021-04-17 DIAGNOSIS — N170 Acute kidney failure with tubular necrosis: Secondary | ICD-10-CM

## 2021-04-17 DIAGNOSIS — J9690 Respiratory failure, unspecified, unspecified whether with hypoxia or hypercapnia: Secondary | ICD-10-CM

## 2021-04-17 DIAGNOSIS — J9621 Acute and chronic respiratory failure with hypoxia: Secondary | ICD-10-CM

## 2021-04-17 DIAGNOSIS — N179 Acute kidney failure, unspecified: Secondary | ICD-10-CM | POA: Diagnosis not present

## 2021-04-17 DIAGNOSIS — K567 Ileus, unspecified: Secondary | ICD-10-CM

## 2021-04-17 DIAGNOSIS — F411 Generalized anxiety disorder: Secondary | ICD-10-CM | POA: Diagnosis not present

## 2021-04-17 DIAGNOSIS — J9601 Acute respiratory failure with hypoxia: Secondary | ICD-10-CM | POA: Diagnosis not present

## 2021-04-17 DIAGNOSIS — R112 Nausea with vomiting, unspecified: Secondary | ICD-10-CM

## 2021-04-17 DIAGNOSIS — T85598A Other mechanical complication of other gastrointestinal prosthetic devices, implants and grafts, initial encounter: Secondary | ICD-10-CM

## 2021-04-17 DIAGNOSIS — I10 Essential (primary) hypertension: Secondary | ICD-10-CM | POA: Diagnosis not present

## 2021-04-17 LAB — GLUCOSE, CAPILLARY
Glucose-Capillary: 139 mg/dL — ABNORMAL HIGH (ref 70–99)
Glucose-Capillary: 177 mg/dL — ABNORMAL HIGH (ref 70–99)
Glucose-Capillary: 138 mg/dL — ABNORMAL HIGH (ref 70–99)
Glucose-Capillary: 169 mg/dL — ABNORMAL HIGH (ref 70–99)
Glucose-Capillary: 128 mg/dL — ABNORMAL HIGH (ref 70–99)

## 2021-04-17 LAB — CBC
HCT: 43.3 % (ref 39.0–52.0)
Hemoglobin: 13.8 g/dL (ref 13.0–17.0)
MCH: 28 pg (ref 26.0–34.0)
MCHC: 31.9 g/dL (ref 30.0–36.0)
MCV: 88 fL (ref 80.0–100.0)
Platelets: 196 10*3/uL (ref 150–400)
RBC: 4.92 MIL/uL (ref 4.22–5.81)
RDW: 16.2 % — ABNORMAL HIGH (ref 11.5–15.5)
WBC: 14 10*3/uL — ABNORMAL HIGH (ref 4.0–10.5)
nRBC: 0.3 % — ABNORMAL HIGH (ref 0.0–0.2)

## 2021-04-17 LAB — BLOOD GAS, ARTERIAL
Acid-Base Excess: 4.2 mmol/L — ABNORMAL HIGH (ref 0.0–2.0)
Bicarbonate: 29.1 mmol/L — ABNORMAL HIGH (ref 20.0–28.0)
FIO2: 100
O2 Saturation: 89 %
Patient temperature: 36.5
pCO2 arterial: 50.2 mmHg — ABNORMAL HIGH (ref 32.0–48.0)
pH, Arterial: 7.378 (ref 7.350–7.450)
pO2, Arterial: 55.5 mmHg — ABNORMAL LOW (ref 83.0–108.0)

## 2021-04-17 MED ORDER — AMLODIPINE BESYLATE 10 MG PO TABS: 10.0000 mg | ORAL_TABLET | Freq: Every day | ORAL | Status: AC

## 2021-04-17 MED ORDER — PANTOPRAZOLE SODIUM 40 MG PO PACK: 40.0000 mg | PACK | Freq: Every day | ORAL | Status: AC

## 2021-04-17 MED ORDER — SODIUM CHLORIDE 0.9 % IV SOLN: 2.0000 g | Freq: Two times a day (BID) | INTRAVENOUS | Status: AC

## 2021-04-17 MED ORDER — POLYETHYLENE GLYCOL 3350 17 G PO PACK: 17.0000 g | PACK | Freq: Two times a day (BID) | ORAL | 0 refills | Status: AC

## 2021-04-17 MED ORDER — PHENOL 1.4 % MT LIQD: 1.0000 | OROMUCOSAL | 0 refills | Status: AC | PRN

## 2021-04-17 MED ORDER — BUDESONIDE 0.5 MG/2ML IN SUSP: 0.5000 mg | Freq: Two times a day (BID) | RESPIRATORY_TRACT | 12 refills | Status: AC

## 2021-04-17 MED ORDER — IPRATROPIUM-ALBUTEROL 0.5-2.5 (3) MG/3ML IN SOLN
RESPIRATORY_TRACT | Status: AC
  Administered 2021-04-17: 3 mL via RESPIRATORY_TRACT
  Filled 2021-04-17: qty 3

## 2021-04-17 MED ORDER — PROSOURCE TF PO LIQD: 45.0000 mL | Freq: Three times a day (TID) | ORAL | Status: AC

## 2021-04-17 MED ORDER — DOCUSATE SODIUM 50 MG/5ML PO LIQD: 100.0000 mg | Freq: Two times a day (BID) | ORAL | 0 refills | Status: AC

## 2021-04-17 MED ORDER — FLUOXETINE HCL 20 MG PO CAPS: 20.0000 mg | ORAL_CAPSULE | Freq: Every day | ORAL | 3 refills | Status: AC

## 2021-04-17 MED ORDER — GERHARDT'S BUTT CREAM: 1.0000 "application " | TOPICAL_CREAM | CUTANEOUS | Status: AC | PRN

## 2021-04-17 MED ORDER — GUAIFENESIN 100 MG/5ML PO SOLN: 10.0000 mL | ORAL | 0 refills | Status: AC

## 2021-04-17 MED ORDER — HYDRALAZINE HCL 25 MG PO TABS: 25.0000 mg | ORAL_TABLET | Freq: Three times a day (TID) | ORAL | Status: AC | PRN

## 2021-04-17 MED ORDER — OSMOLITE 1.5 CAL PO LIQD: 1000.0000 mL | ORAL | 0 refills | Status: AC

## 2021-04-17 MED ORDER — ALUM & MAG HYDROXIDE-SIMETH 200-200-20 MG/5ML PO SUSP: 30.0000 mL | Freq: Four times a day (QID) | ORAL | 0 refills | Status: AC | PRN

## 2021-04-17 MED ORDER — SENNOSIDES-DOCUSATE SODIUM 8.6-50 MG PO TABS: 2.0000 | ORAL_TABLET | Freq: Two times a day (BID) | ORAL | Status: AC

## 2021-04-17 MED ORDER — METOCLOPRAMIDE HCL 5 MG/ML IJ SOLN: 5.0000 mg | Freq: Three times a day (TID) | INTRAMUSCULAR | 0 refills | Status: AC | PRN

## 2021-04-17 MED ORDER — IPRATROPIUM BROMIDE 0.02 % IN SOLN: 2.5000 mL | Freq: Three times a day (TID) | RESPIRATORY_TRACT | 12 refills | Status: AC

## 2021-04-17 MED ORDER — FENTANYL BOLUS VIA INFUSION: 25.0000 ug | INTRAVENOUS | 0 refills | Status: AC | PRN

## 2021-04-17 MED ORDER — IPRATROPIUM-ALBUTEROL 0.5-2.5 (3) MG/3ML IN SOLN
3.0000 mL | Freq: Four times a day (QID) | RESPIRATORY_TRACT | Status: DC | PRN
  Administered 2021-04-17: 3 mL via RESPIRATORY_TRACT

## 2021-04-17 MED ORDER — CARISOPRODOL 350 MG PO TABS: 350.0000 mg | ORAL_TABLET | Freq: Every evening | ORAL | 0 refills | Status: AC | PRN

## 2021-04-17 MED ORDER — FREE WATER: 150.0000 mL | Status: AC

## 2021-04-17 MED ORDER — ARFORMOTEROL TARTRATE 15 MCG/2ML IN NEBU: 15.0000 ug | INHALATION_SOLUTION | Freq: Two times a day (BID) | RESPIRATORY_TRACT | Status: AC

## 2021-04-17 MED ORDER — FENTANYL 2500MCG IN NS 250ML (10MCG/ML) PREMIX INFUSION: 0.0000 ug/h | INTRAVENOUS | 0 refills | Status: AC

## 2021-04-17 MED ORDER — FUROSEMIDE 10 MG/ML IJ SOLN
40.0000 mg | Freq: Once | INTRAMUSCULAR | Status: AC
  Administered 2021-04-17: 40 mg via INTRAVENOUS
  Filled 2021-04-17: qty 4

## 2021-04-17 MED ORDER — CARVEDILOL 3.125 MG PO TABS: 3.1250 mg | ORAL_TABLET | Freq: Two times a day (BID) | ORAL | Status: AC

## 2021-04-17 MED ORDER — IPRATROPIUM-ALBUTEROL 0.5-2.5 (3) MG/3ML IN SOLN: 3.0000 mL | Freq: Four times a day (QID) | RESPIRATORY_TRACT | Status: AC | PRN

## 2021-04-17 MED ORDER — SODIUM CHLORIDE 0.9 % IV SOLN: 250.0000 mL | INTRAVENOUS | 0 refills | Status: AC

## 2021-04-17 MED ORDER — HEPARIN SODIUM (PORCINE) 5000 UNIT/ML IJ SOLN: 5000.0000 [IU] | Freq: Three times a day (TID) | INTRAMUSCULAR | Status: AC

## 2021-04-17 MED ORDER — ALPRAZOLAM 0.5 MG PO TABS: 0.5000 mg | ORAL_TABLET | Freq: Three times a day (TID) | ORAL | 0 refills | Status: AC

## 2021-04-17 MED ORDER — ONDANSETRON HCL 4 MG/2ML IJ SOLN: 4.0000 mg | Freq: Four times a day (QID) | INTRAMUSCULAR | 0 refills | Status: AC | PRN

## 2021-04-17 MED ORDER — ORAL CARE MOUTH RINSE: 15.0000 mL | OROMUCOSAL | 0 refills | Status: AC | PRN

## 2021-04-17 MED ORDER — CHLORHEXIDINE GLUCONATE 0.12% ORAL RINSE (MEDLINE KIT): 15.0000 mL | Freq: Two times a day (BID) | OROMUCOSAL | 0 refills | Status: AC

## 2021-04-17 MED ORDER — INSULIN ASPART 100 UNIT/ML IJ SOLN: 0.0000 [IU] | INTRAMUSCULAR | 11 refills | Status: AC

## 2021-04-17 MED ORDER — SIMVASTATIN 20 MG PO TABS: 20.0000 mg | ORAL_TABLET | Freq: Every day | ORAL | 0 refills | Status: AC

## 2021-04-17 MED ORDER — CHLORHEXIDINE GLUCONATE CLOTH 2 % EX PADS: 6.0000 | MEDICATED_PAD | Freq: Every day | CUTANEOUS | Status: AC

## 2021-04-17 MED ORDER — LORATADINE 10 MG PO TABS: 10.0000 mg | ORAL_TABLET | Freq: Every day | ORAL | Status: AC

## 2021-04-17 MED ORDER — LORAZEPAM 2 MG/ML IJ SOLN: 0.5000 mg | Freq: Four times a day (QID) | INTRAMUSCULAR | 0 refills | Status: AC | PRN
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
## 2021-04-17 NOTE — Procedures (Signed)
Cortrak ? ?Tube Type:  Cortrak - 43 inches ?Tube Location:  Left nare ?Initial Placement:  Stomach ?Secured by: Bridle ?Technique Used to Measure Tube Placement:  Marking at nare/corner of mouth ?Cortrak Secured At:  75 cm ? ?Cortrak Tube Team Note: ? ?Consult received to place a Cortrak feeding tube.  ? ?X-ray is required, abdominal x-ray has been ordered by the Cortrak team. Please confirm tube placement before using the Cortrak tube.  ? ?If the tube becomes dislodged please keep the tube and contact the Cortrak team at [email or web address] (password [email or web address]) for replacement.  ?If after hours and replacement cannot be delayed, place a NG tube and confirm placement with an abdominal x-ray.  ? ? ?Kenrick Pore MS, RD, LDN ?Please refer to AMION for RD and/or RD on-call/weekend/after hours pager ? ? ?
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
## 2021-04-17 NOTE — Plan of Care (Signed)
  Problem: Education: Goal: Knowledge of General Education information will improve Description: Including pain rating scale, medication(s)/side effects and non-pharmacologic comfort measures 04/17/2021 1209 by Luan Moore, RN Outcome: Adequate for Discharge 04/17/2021 0853 by Luan Moore, RN Outcome: Progressing   Problem: Health Behavior/Discharge Planning: Goal: Ability to manage health-related needs will improve 04/17/2021 1209 by Luan Moore, RN Outcome: Adequate for Discharge 04/17/2021 0853 by Luan Moore, RN Outcome: Progressing   Problem: Clinical Measurements: Goal: Ability to maintain clinical measurements within normal limits will improve 04/17/2021 1209 by Luan Moore, RN Outcome: Adequate for Discharge 04/17/2021 0853 by Luan Moore, RN Outcome: Progressing Goal: Will remain free from infection 04/17/2021 1209 by Luan Moore, RN Outcome: Adequate for Discharge 04/17/2021 0853 by Luan Moore, RN Outcome: Progressing Goal: Diagnostic test results will improve 04/17/2021 1209 by Luan Moore, RN Outcome: Adequate for Discharge 04/17/2021 0853 by Luan Moore, RN Outcome: Progressing Goal: Respiratory complications will improve 04/17/2021 1209 by Luan Moore, RN Outcome: Adequate for Discharge 04/17/2021 0853 by Luan Moore, RN Outcome: Progressing Goal: Cardiovascular complication will be avoided 04/17/2021 1209 by Luan Moore, RN Outcome: Adequate for Discharge 04/17/2021 0853 by Luan Moore, RN Outcome: Progressing   Problem: Activity: Goal: Risk for activity intolerance will decrease 04/17/2021 1209 by Luan Moore, RN Outcome: Adequate for Discharge 04/17/2021 0853 by Luan Moore, RN Outcome: Progressing   Problem: Nutrition: Goal: Adequate nutrition will be maintained 04/17/2021 1209 by Luan Moore, RN Outcome: Adequate for Discharge 04/17/2021 0853 by Luan Moore, RN Outcome: Progressing   Problem: Coping: Goal: Level of anxiety will decrease 04/17/2021 1209 by Luan Moore, RN Outcome: Adequate for Discharge 04/17/2021 0853 by Luan Moore, RN Outcome: Progressing   Problem: Elimination: Goal: Will not experience complications related to bowel motility 04/17/2021 1209 by Luan Moore, RN Outcome: Adequate for Discharge 04/17/2021 0853 by Luan Moore, RN Outcome: Progressing Goal: Will not experience complications related to urinary retention 04/17/2021 1209 by Luan Moore, RN Outcome: Adequate for Discharge 04/17/2021 0853 by Luan Moore, RN Outcome: Progressing   Problem: Pain Managment: Goal: General experience of comfort will improve 04/17/2021 1209 by Luan Moore, RN Outcome: Adequate for Discharge 04/17/2021 0853 by Luan Moore, RN Outcome: Progressing   Problem: Safety: Goal: Ability to remain free from injury will improve 04/17/2021 1209 by Luan Moore, RN Outcome: Adequate for Discharge 04/17/2021 0853 by Luan Moore, RN Outcome: Progressing   Problem: Skin Integrity: Goal: Risk for impaired skin integrity will decrease 04/17/2021 1209 by Luan Moore, RN Outcome: Adequate for Discharge 04/17/2021 0853 by Luan Moore, RN Outcome: Progressing   Problem: Activity: Goal: Ability to tolerate increased activity will improve 04/17/2021 1209 by Luan Moore, RN Outcome: Adequate for Discharge 04/17/2021 0853 by Luan Moore, RN Outcome: Progressing   Problem: Respiratory: Goal: Ability to maintain a clear airway and adequate ventilation will improve 04/17/2021 1209 by Luan Moore, RN Outcome: Adequate for Discharge 04/17/2021 0853 by Luan Moore, RN Outcome: Progressing   Problem: Role Relationship: Goal: Method of communication will improve 04/17/2021 1209 by Luan Moore, RN Outcome: Adequate for Discharge 04/17/2021 0853 by Luan Moore, RN Outcome: Progressing

## 2021-04-17 NOTE — Progress Notes (Signed)
RT arrived and found pt coughing and copious amounts of blood (appears old, not frank) coming into the ETT.  Pt was suctioned and became very agitated.  RT made aware.  Pt given more sedation. RN suctioned more blood from mouth.  FiO2 increased to allow pt to recover after becoming anxious bc sat fell to 89% and would not return to baseline for pt.  RT will continue to monitor and titrate FiO2 as tolerated.

## 2021-04-17 NOTE — Plan of Care (Signed)

## 2021-04-17 NOTE — Progress Notes (Signed)
SLP Cancellation Note  Patient Details Name: KACI LASSLEY MRN: [id number] DOB: 1946/09/08   Cancelled treatment:       Reason Eval/Treat Not Completed: Medical issues which prohibited therapy (remains on the vent). Will continue to follow.    Osie Bond., M.A. [facility] Pager [phone] Office [phone]  04/17/2021, 7:03 AM

## 2021-04-17 NOTE — Progress Notes (Signed)
Pt transported by RRT and RN from [facility] to [facility] with no complications.

## 2021-04-18 DIAGNOSIS — N170 Acute kidney failure with tubular necrosis: Secondary | ICD-10-CM

## 2021-04-18 DIAGNOSIS — J449 Chronic obstructive pulmonary disease, unspecified: Secondary | ICD-10-CM

## 2021-04-18 DIAGNOSIS — J9621 Acute and chronic respiratory failure with hypoxia: Secondary | ICD-10-CM | POA: Diagnosis not present

## 2021-04-18 DIAGNOSIS — J690 Pneumonitis due to inhalation of food and vomit: Secondary | ICD-10-CM

## 2021-04-18 LAB — CBC
HCT: 42.4 % (ref 39.0–52.0)
Hemoglobin: 14.1 g/dL (ref 13.0–17.0)
MCH: 28.8 pg (ref 26.0–34.0)
MCHC: 33.3 g/dL (ref 30.0–36.0)
MCV: 86.7 fL (ref 80.0–100.0)
Platelets: 167 10*3/uL (ref 150–400)
RBC: 4.89 MIL/uL (ref 4.22–5.81)
RDW: 15.9 % — ABNORMAL HIGH (ref 11.5–15.5)
WBC: 16.6 10*3/uL — ABNORMAL HIGH (ref 4.0–10.5)
nRBC: 0.1 % (ref 0.0–0.2)

## 2021-04-18 LAB — COMPREHENSIVE METABOLIC PANEL
ALT: 24 U/L (ref 0–44)
AST: 26 U/L (ref 15–41)
Albumin: 1.5 g/dL — ABNORMAL LOW (ref 3.5–5.0)
Alkaline Phosphatase: 67 U/L (ref 38–126)
Anion gap: 7 (ref 5–15)
BUN: 68 mg/dL — ABNORMAL HIGH (ref 8–23)
CO2: 29 mmol/L (ref 22–32)
Calcium: 8 mg/dL — ABNORMAL LOW (ref 8.9–10.3)
Chloride: 104 mmol/L (ref 98–111)
Creatinine, Ser: 2.24 mg/dL — ABNORMAL HIGH (ref 0.61–1.24)
GFR, Estimated: 30 mL/min — ABNORMAL LOW (ref >60)
Glucose, Bld: 159 mg/dL — ABNORMAL HIGH (ref 70–99)
Potassium: 4.8 mmol/L (ref 3.5–5.1)
Sodium: 140 mmol/L (ref 135–145)
Total Bilirubin: 0.8 mg/dL (ref 0.3–1.2)
Total Protein: 6.4 g/dL — ABNORMAL LOW (ref 6.5–8.1)

## 2021-04-18 NOTE — Consult Note (Signed)
Pulmonary Critical Care Medicine [place]  PULMONARY SERVICE  Date of Service: 04/18/2021  PULMONARY CRITICAL CARE CONSULT   ELKIN VAZIRI  [id number]  DOB: 10-29-1945   DOA: 04/17/2021  Referring Physician: Merton Border, MD  HPI: Kristopher [name] is a 75 y.o. male seen for follow up of Acute on Chronic Respiratory Failure.  Patient has multiple medical problems including hypertension systolic heart failure COPD presented to the hospital apparently after what sounds like a choking episode.  Patient had desaturations noted down into the 70s.  Patient was intubated placed on mechanical ventilation and transferred to the ICU.  Apparently was extubated 3 days later however had worsening of his secretions and ended up having to be reintubated.  He is transferred to our facility for further management and weaning currently is orally intubated on mechanical ventilation.  Review of Systems:  ROS performed and is unremarkable other than noted above.  Past Medical History:  Diagnosis Date   Anxiety    takes Xanax prn   Cardiomyopathy    takes Digoxin daily   Depression    takes Prozac daily   Dyslipidemia    takes Simvastatin daily   HTN (hypertension)    takes Carvedilol and Lisinopril daily   Impaired hearing    left    Past Surgical History:  Procedure Laterality Date   CHOLECYSTECTOMY     > 31yr ago   COLONOSCOPY     HERNIA REPAIR     kidney removed     but unsure of which one;>180yrago   MASS EXCISION Left 06/14/2013   Procedure: EXCISION of left chest wall MASS;  Surgeon: BrMadilyn HookDO;  [facility];  Laterality: Left;   SPLENECTOMY     >1053yrgo    Social History:    reports that he has been smoking cigarettes. He has a 50.00 pack-year smoking history. He has never used smokeless tobacco. He reports that he does not drink alcohol and does not use drugs.  Family History: Non-Contributory to the present illness  No Known  Allergies  Medications: Reviewed on Rounds  Physical Exam:  Vitals: Temperature is 97.4 pulse 107 respiratory rate is 26 blood pressure is 107/62 saturations 96%  Ventilator Settings on assist control FiO2 is 90% tidal volume 574 PEEP 7  General: Comfortable at this time Eyes: Grossly normal lids, irises & conjunctiva ENT: grossly tongue is normal Neck: no obvious mass Cardiovascular: S1-S2 normal no gallop or rub Respiratory: Scattered rhonchi expansion is equal Abdomen: Soft and nontender Skin: no rash seen on limited exam Musculoskeletal: not rigid Psychiatric:unable to assess Neurologic: no seizure no involuntary movements         Labs on Admission:  Basic Metabolic Panel: Recent Labs  Lab 04/12/21 0102 04/13/21 0127 04/13/21 0345 04/15/21 0413 04/16/21 0059 04/18/21 0651  NA 145 145 147* 143 142 140  K 5.1 5.1 5.0 5.2* 4.6 4.8  CL 104 106  --  105 103 104  CO2 34* 31  --  33* 32 29  GLUCOSE 141* 153*  --  146* 139* 159*  BUN 73* 75*  --  61* 60* 68*  CREATININE 1.32* 1.60*  --  1.41* 1.51* 2.24*  CALCIUM 8.4* 8.3*  --  8.3* 8.2* 8.0*  MG 2.8* 2.8*  --  2.5* 2.3  --   PHOS  --   --   --  3.7 3.8  --     Recent Labs  Lab  04/13/21 0345 04/17/21 2225  PHART 7.394 7.378  PCO2ART 58.4* 50.2*  PO2ART 55* 55.5*  HCO3 35.6* 29.1*  O2SAT 87.0 89.0    Liver Function Tests: Recent Labs  Lab 04/13/21 0127 04/18/21 0651  AST 13* 26  ALT 26 24  ALKPHOS 72 67  BILITOT 0.6 0.8  PROT 6.0* 6.4*  ALBUMIN 1.9* 1.5*   No results for input(s): LIPASE, AMYLASE in the last 168 hours. No results for input(s): AMMONIA in the last 168 hours.  CBC: Recent Labs  Lab 04/11/21 1341 04/12/21 0102 04/14/21 0326 04/15/21 0413 04/16/21 0059 04/17/21 0109 04/18/21 0651  WBC 27.6*   < > 21.4* 15.8* 16.4* 14.0* 16.6*  NEUTROABS 26.8*  --   --   --   --   --   --   HGB 15.1   < > 14.4 15.0 14.6 13.8 14.1  HCT 47.2   < > 44.8 46.6 44.4 43.3 42.4  MCV 89.1   < > 89.4  88.6 87.6 88.0 86.7  PLT 277   < > 278 249 255 196 167   < > = values in this interval not displayed.    Cardiac Enzymes: No results for input(s): CKTOTAL, CKMB, CKMBINDEX, TROPONINI in the last 168 hours.  BNP (last 3 results) Recent Labs    04/04/21 0137 04/13/21 0127  BNP 691.1* 57.9    ProBNP (last 3 results) No results for input(s): PROBNP in the last 8760 hours.   Radiological Exams on Admission: DG Chest Port 1 View  Result Date: 04/17/2021 CLINICAL DATA:  Respiratory failure EXAM: PORTABLE CHEST 1 VIEW COMPARISON:  04/17/2021, 04/13/2021, 04/11/2021 FINDINGS: Endotracheal tube tip is about 2.5 cm superior to the carina. Esophageal tube tip below the diaphragm but incompletely visualized. Small increased left pleural effusion and small increased right pleural effusion. Underlying emphysema. Extensive bilateral pulmonary airspace disease which may be due to edema or pneumonia. Stable cardiomediastinal silhouette with aortic atherosclerosis. Worsening airspace disease at left lung base. IMPRESSION: 1. Endotracheal tube tip about 2.5 cm superior to the carina 2. Underlying emphysema. Increasing small left greater than right pleural effusions. Worsening airspace disease at left lung base. Extensive pulmonary infiltrates or edema are otherwise unchanged Electronically Signed   By: Donavan Foil M.D.   On: 04/17/2021 22:31   DG CHEST PORT 1 VIEW  Result Date: 04/17/2021 CLINICAL DATA:  Intubation, respiratory distress, hypoxemia EXAM: PORTABLE CHEST 1 VIEW COMPARISON:  Portable exam 0911 hours compared to 04/13/2021 FINDINGS: Tip of endotracheal tube tube projects approximately 4.2 cm above carina. Nasogastric tube extends into stomach. Normal heart size, mediastinal contours, and pulmonary vascularity. Atherosclerotic calcification aorta. Diffuse BILATERAL pulmonary infiltrates again identified, slightly increased. No definite pleural effusion or pneumothorax. Osseous structures  unremarkable. IMPRESSION: Increased BILATERAL pulmonary infiltrates versus prior study. Aortic Atherosclerosis (ICD10-I70.0). Electronically Signed   By: Lavonia Dana M.D.   On: 04/17/2021 10:52   DG Abd Portable 1V  Result Date: 04/17/2021 CLINICAL DATA:  Respiratory failure NG tube EXAM: PORTABLE ABDOMEN - 1 VIEW COMPARISON:  04/17/2021 FINDINGS: Esophageal tube tip overlies the mid stomach. Mild gaseous distension of bowel in the central abdomen. IMPRESSION: Esophageal tube tip overlies the gastric body. Electronically Signed   By: Donavan Foil M.D.   On: 04/17/2021 22:29   DG Abd Portable 1V  Result Date: 04/17/2021 CLINICAL DATA:  Feeding tube placement. EXAM: PORTABLE ABDOMEN - 1 VIEW COMPARISON:  04/12/2021. FINDINGS: Feeding tube terminates in the gastric body. Gaseous distention of visualized colon. Coarsened  pulmonary markings in the visualized chest. Small left pleural effusion. IMPRESSION: 1. Feeding tube terminates in the body of the stomach near the gastric antrum. 2. Coarsened pulmonary markings with a small left pleural effusion. Please refer to dedicated chest radiograph done the same day. Electronically Signed   By: Lorin Picket M.D.   On: 04/17/2021 11:57    Assessment/Plan Active Problems:   Acute on chronic respiratory failure with hypoxia (HCC)   Aspiration pneumonia of both lower lobes due to gastric secretions (HCC)   COPD, severe (HCC)   Acute renal failure due to tubular necrosis (HCC)   Acute on chronic respiratory failure with hypoxia patient right now is on assist control mode requiring 90% FiO2 we will try to titrate the oxygen down.  Patient still has a PEEP of 7 we can probably go up on the PEEP.  Chest x-ray results were reviewed showing increasing bilateral infiltrates.  Need to review patient's fluid status diurese but creatinine is getting worse may need to get nephrology involved Aspiration pneumonia sounds like initial insult was the aspiration event that he  suffered while eating.  Right now chest x-ray still showing some significant infiltrates.  Patient has been treated with antibiotics Severe COPD patient has severe COPD by history we will continue with nebulizers as necessary Acute renal failure we will need to monitor the patient's Chronic heart failure preserved ejection fraction last EF was 60 to 65%  I have personally seen and evaluated the patient, evaluated laboratory and imaging results, formulated the assessment and plan and placed orders. The Patient requires high complexity decision making with multiple systems involvement.  Case was discussed on Rounds with the Respiratory Therapy Director and the Respiratory staff Time Spent 53mnutes  Kayde Atkerson A Nyjah Denio, MD FBaptist Health Surgery CenterPulmonary Critical Care Medicine Sleep Medicine

## 2021-04-19 ENCOUNTER — Ambulatory Visit: Payer: Self-pay | Admitting: Family Medicine

## 2021-04-19 DIAGNOSIS — J9621 Acute and chronic respiratory failure with hypoxia: Secondary | ICD-10-CM | POA: Diagnosis not present

## 2021-04-19 DIAGNOSIS — I5032 Chronic diastolic (congestive) heart failure: Secondary | ICD-10-CM | POA: Diagnosis not present

## 2021-04-19 DIAGNOSIS — N170 Acute kidney failure with tubular necrosis: Secondary | ICD-10-CM | POA: Diagnosis not present

## 2021-04-19 DIAGNOSIS — J690 Pneumonitis due to inhalation of food and vomit: Secondary | ICD-10-CM | POA: Diagnosis not present

## 2021-04-19 LAB — BLOOD GAS, ARTERIAL
Acid-Base Excess: 1.7 mmol/L (ref 0.0–2.0)
Bicarbonate: 26.8 mmol/L (ref 20.0–28.0)
FIO2: 70
O2 Saturation: 80.5 %
Patient temperature: 37
pCO2 arterial: 50.3 mmHg — ABNORMAL HIGH (ref 32.0–48.0)
pH, Arterial: 7.346 — ABNORMAL LOW (ref 7.350–7.450)
pO2, Arterial: 46.8 mmHg — ABNORMAL LOW (ref 83.0–108.0)

## 2021-04-21 ENCOUNTER — Other Ambulatory Visit (HOSPITAL_COMMUNITY): Payer: Medicare Other

## 2021-04-21 DIAGNOSIS — J690 Pneumonitis due to inhalation of food and vomit: Secondary | ICD-10-CM | POA: Diagnosis not present

## 2021-04-21 DIAGNOSIS — J9621 Acute and chronic respiratory failure with hypoxia: Secondary | ICD-10-CM | POA: Diagnosis not present

## 2021-04-21 DIAGNOSIS — I5032 Chronic diastolic (congestive) heart failure: Secondary | ICD-10-CM | POA: Diagnosis not present

## 2021-04-21 DIAGNOSIS — N170 Acute kidney failure with tubular necrosis: Secondary | ICD-10-CM | POA: Diagnosis not present

## 2021-04-21 LAB — BASIC METABOLIC PANEL
Anion gap: 8 (ref 5–15)
BUN: 96 mg/dL — ABNORMAL HIGH (ref 8–23)
CO2: 23 mmol/L (ref 22–32)
Calcium: 7.6 mg/dL — ABNORMAL LOW (ref 8.9–10.3)
Chloride: 101 mmol/L (ref 98–111)
Creatinine, Ser: 3.56 mg/dL — ABNORMAL HIGH (ref 0.61–1.24)
GFR, Estimated: 17 mL/min — ABNORMAL LOW (ref >60)
Glucose, Bld: 224 mg/dL — ABNORMAL HIGH (ref 70–99)
Potassium: 6.9 mmol/L (ref 3.5–5.1)
Sodium: 132 mmol/L — ABNORMAL LOW (ref 135–145)

## 2021-04-21 LAB — CBC
HCT: 41.8 % (ref 39.0–52.0)
Hemoglobin: 13.7 g/dL (ref 13.0–17.0)
MCH: 28.1 pg (ref 26.0–34.0)
MCHC: 32.8 g/dL (ref 30.0–36.0)
MCV: 85.8 fL (ref 80.0–100.0)
Platelets: 144 10*3/uL — ABNORMAL LOW (ref 150–400)
RBC: 4.87 MIL/uL (ref 4.22–5.81)
RDW: 15.7 % — ABNORMAL HIGH (ref 11.5–15.5)
WBC: 10.6 10*3/uL — ABNORMAL HIGH (ref 4.0–10.5)
nRBC: 0 % (ref 0.0–0.2)

## 2021-04-21 LAB — POTASSIUM: Potassium: 7.1 mmol/L (ref 3.5–5.1)

## 2021-04-21 NOTE — Progress Notes (Signed)
Pulmonary Critical Care Medicine [place]   PULMONARY CRITICAL CARE SERVICE  PROGRESS NOTE     BERNARD BANKEN  [id number]  DOB: 1946-07-10   DOA: 04/17/2021  Referring Physician: Merton Border, MD  HPI: ABDOUL [name] is a 75 y.o. male being followed for ventilator/airway/oxygen weaning Acute on Chronic Respiratory Failure.  Patient continues to do poorly.  His oxygen requirements are still at 80%.  Also his creatinine has been progressively getting worse and his potassium was up to 6.9-7.1.  Discussed with the primary care team he is may very well need acute dialysis and possibly CRRT  Medications: Reviewed on Rounds  Physical Exam:  Vitals: Temperature 98.0 pulse 84 respiratory rate is 27 blood pressure 136/64 saturations 98%  Ventilator Settings on assist control FiO2 80% tidal volume was 670 PEEP of 7  General: Comfortable at this time Neck: supple Cardiovascular: no malignant arrhythmias Respiratory: Very coarse breath sounds noted bilaterally Skin: no rash seen on limited exam Musculoskeletal: No gross abnormality Psychiatric:unable to assess Neurologic:no involuntary movements         Lab Data:   Basic Metabolic Panel: Recent Labs  Lab 04/15/21 0413 04/16/21 0059 04/18/21 0651 04/21/21 0612 04/21/21 1314  NA 143 142 140 132*  --   K 5.2* 4.6 4.8 6.9* 7.1*  CL 105 103 104 101  --   CO2 33* 32 29 23  --   GLUCOSE 146* 139* 159* 224*  --   BUN 61* 60* 68* 96*  --   CREATININE 1.41* 1.51* 2.24* 3.56*  --   CALCIUM 8.3* 8.2* 8.0* 7.6*  --   MG 2.5* 2.3  --   --   --   PHOS 3.7 3.8  --   --   --     ABG: Recent Labs  Lab 04/17/21 2225 04/19/21 1225  PHART 7.378 7.346*  PCO2ART 50.2* 50.3*  PO2ART 55.5* 46.8*  HCO3 29.1* 26.8  O2SAT 89.0 80.5    Liver Function Tests: Recent Labs  Lab 04/18/21 0651  AST 26  ALT 24  ALKPHOS 67  BILITOT 0.8  PROT 6.4*  ALBUMIN 1.5*   No results for input(s): LIPASE, AMYLASE in the last  168 hours. No results for input(s): AMMONIA in the last 168 hours.  CBC: Recent Labs  Lab 04/15/21 0413 04/16/21 0059 04/17/21 0109 04/18/21 0651 04/21/21 0349  WBC 15.8* 16.4* 14.0* 16.6* 10.6*  HGB 15.0 14.6 13.8 14.1 13.7  HCT 46.6 44.4 43.3 42.4 41.8  MCV 88.6 87.6 88.0 86.7 85.8  PLT 249 255 196 167 144*    Cardiac Enzymes: No results for input(s): CKTOTAL, CKMB, CKMBINDEX, TROPONINI in the last 168 hours.  BNP (last 3 results) Recent Labs    04/04/21 0137 04/13/21 0127  BNP 691.1* 57.9    ProBNP (last 3 results) No results for input(s): PROBNP in the last 8760 hours.  Radiological Exams: DG Kristopher 1 View  Result Date: 04/21/2021 CLINICAL DATA:  Nausea and vomiting. EXAM: ABDOMEN - 1 VIEW COMPARISON:  Plain films of the abdomen and single view of the chest 04/17/2021. FINDINGS: Feeding tube is in place with the tip in the distal body of the stomach. Bowel gas pattern is nonobstructive. Multiple surgical clips are seen in the abdomen. Lung bases demonstrate airspace disease which appears improved compared to the prior chest film. IMPRESSION: No acute abnormality abdomen. Feeding tube tip is in the distal body of the stomach. Partial visualization of bibasilar airspace disease which appears improved.  Electronically Signed   By: Inge Rise M.D.   On: 04/21/2021 17:07   US RENAL  Result Date: 04/21/2021 CLINICAL DATA:  Worsening renal function EXAM: RENAL / URINARY TRACT ULTRASOUND COMPLETE COMPARISON:  CT 10/02/2019 FINDINGS: Right Kidney: Renal measurements: 14.3 x 6.8 x 6.4 cm (volume = 330 cm^3). Echogenicity within normal limits. No hydronephrosis. No greater than 10 renal cysts visualized measuring up to 2.8 cm. Left Kidney: Surgically absent. Bladder: Not visualized. Other: None. IMPRESSION: 1. Multiple right renal cysts, no hydronephrosis. 2. Left kidney surgically absent. Electronically Signed   By: Dahlia Bailiff MD   On: 04/21/2021 18:49     Assessment/Plan Active Problems:   Acute on chronic respiratory failure with hypoxia (HCC)   Aspiration pneumonia of both lower lobes due to gastric secretions (HCC)   COPD, severe (HCC)   Acute renal failure due to tubular necrosis (HCC)   Chronic heart failure with preserved ejection fraction (HCC)   Acute on chronic respiratory failure hypoxia patient continues to require high oxygen requirements obviously not a candidate for any type of weaning at this stage.  Chest x-ray still showing diffuse infiltrates. Acute renal failure renal function has been progressively getting worse may need acute dialysis will discuss further with the primary care team Severe COPD medical management nebulizers as necessary Chronic heart failure may be an element of fluid overload also more likely though due to the renal issues going on right now. Aspiration pneumonia has been treated with antibiotics   I have personally seen and evaluated the patient, evaluated laboratory and imaging results, formulated the assessment and plan and placed orders. The Patient requires high complexity decision making with multiple systems involvement.  Rounds were done with the Respiratory Therapy Director and Staff therapists and discussed with nursing staff also.  Allyne Gee, MD [facility] Pulmonary Critical Care Medicine Sleep Medicine

## 2021-04-21 NOTE — Progress Notes (Signed)
Pulmonary Critical Care Medicine [place]   PULMONARY CRITICAL CARE SERVICE  PROGRESS NOTE     Kristopher [name]  [id number]  DOB: 1946-08-06   DOA: 04/17/2021  Referring Physician: Merton Border, MD  HPI: Kristopher [name] is a 75 y.o. male being followed for ventilator/airway/oxygen weaning Acute on Chronic Respiratory Failure.  Patient is comfortable right now without distress has been on full support on assist control mode  Medications: Reviewed on Rounds  Physical Exam:  Vitals: Temperature is 97.0 pulse 96 respiratory rate is 26 blood pressure is 128/74 saturations 90  Ventilator Settings on assist control FiO2 70% tidal volume 643 PEEP 7  General: Comfortable at this time Neck: supple Cardiovascular: no malignant arrhythmias Respiratory: Scattered rhonchi expansion equal Skin: no rash seen on limited exam Musculoskeletal: No gross abnormality Psychiatric:unable to assess Neurologic:no involuntary movements         Lab Data:   Basic Metabolic Panel: Recent Labs  Lab 04/15/21 0413 04/16/21 0059 04/18/21 0651 04/21/21 0612  NA 143 142 140 132*  K 5.2* 4.6 4.8 6.9*  CL 105 103 104 101  CO2 33* 32 29 23  GLUCOSE 146* 139* 159* 224*  BUN 61* 60* 68* 96*  CREATININE 1.41* 1.51* 2.24* 3.56*  CALCIUM 8.3* 8.2* 8.0* 7.6*  MG 2.5* 2.3  --   --   PHOS 3.7 3.8  --   --     ABG: Recent Labs  Lab 04/17/21 2225 04/19/21 1225  PHART 7.378 7.346*  PCO2ART 50.2* 50.3*  PO2ART 55.5* 46.8*  HCO3 29.1* 26.8  O2SAT 89.0 80.5    Liver Function Tests: Recent Labs  Lab 04/18/21 0651  AST 26  ALT 24  ALKPHOS 67  BILITOT 0.8  PROT 6.4*  ALBUMIN 1.5*   No results for input(s): LIPASE, AMYLASE in the last 168 hours. No results for input(s): AMMONIA in the last 168 hours.  CBC: Recent Labs  Lab 04/15/21 0413 04/16/21 0059 04/17/21 0109 04/18/21 0651 04/21/21 0349  WBC 15.8* 16.4* 14.0* 16.6* 10.6*  HGB 15.0 14.6 13.8 14.1 13.7  HCT  46.6 44.4 43.3 42.4 41.8  MCV 88.6 87.6 88.0 86.7 85.8  PLT 249 255 196 167 144*    Cardiac Enzymes: No results for input(s): CKTOTAL, CKMB, CKMBINDEX, TROPONINI in the last 168 hours.  BNP (last 3 results) Recent Labs    04/04/21 0137 04/13/21 0127  BNP 691.1* 57.9    ProBNP (last 3 results) No results for input(s): PROBNP in the last 8760 hours.  Radiological Exams: No results found.  Assessment/Plan Active Problems:   Acute on chronic respiratory failure with hypoxia (HCC)   Aspiration pneumonia of both lower lobes due to gastric secretions (HCC)   COPD, severe (HCC)   Acute renal failure due to tubular necrosis (HCC)   Chronic heart failure with preserved ejection fraction (HCC)   Acute on chronic respiratory failure hypoxia plan is to continue with the full support on the ventilator for now respiratory therapy is going to assess the mechanics check and RSB I Aspiration pneumonia has been treated with antibiotics patient has residual infiltrate still noted as previously discussed Severe COPD medical management Acute renal failure tubular necrosis need to continue to monitor the patient's creatinine closely. Chronic heart failure preserved ejection fraction medical management   I have personally seen and evaluated the patient, evaluated laboratory and imaging results, formulated the assessment and plan and placed orders. The Patient requires high complexity decision making with multiple systems involvement.  Rounds were done with the Respiratory Therapy Director and Staff therapists and discussed with nursing staff also.  Allyne Gee, MD [facility] Pulmonary Critical Care Medicine Sleep Medicine

## 2021-04-22 ENCOUNTER — Other Ambulatory Visit (HOSPITAL_COMMUNITY): Payer: Medicare Other

## 2021-04-22 ENCOUNTER — Inpatient Hospital Stay (HOSPITAL_COMMUNITY)
Admission: RE | Admit: 2021-04-22 | Discharge: 2021-05-13 | DRG: 682 | Disposition: E | Payer: Medicare Other | Source: Other Acute Inpatient Hospital | Attending: Pulmonary Disease | Admitting: Pulmonary Disease

## 2021-04-22 ENCOUNTER — Inpatient Hospital Stay (HOSPITAL_COMMUNITY): Payer: Medicare Other

## 2021-04-22 DIAGNOSIS — J9611 Chronic respiratory failure with hypoxia: Secondary | ICD-10-CM | POA: Diagnosis present

## 2021-04-22 DIAGNOSIS — R0603 Acute respiratory distress: Secondary | ICD-10-CM

## 2021-04-22 DIAGNOSIS — E871 Hypo-osmolality and hyponatremia: Secondary | ICD-10-CM | POA: Diagnosis present

## 2021-04-22 DIAGNOSIS — Z66 Do not resuscitate: Secondary | ICD-10-CM | POA: Diagnosis not present

## 2021-04-22 DIAGNOSIS — N281 Cyst of kidney, acquired: Secondary | ICD-10-CM | POA: Diagnosis present

## 2021-04-22 DIAGNOSIS — Z9081 Acquired absence of spleen: Secondary | ICD-10-CM

## 2021-04-22 DIAGNOSIS — E875 Hyperkalemia: Secondary | ICD-10-CM | POA: Diagnosis present

## 2021-04-22 DIAGNOSIS — J811 Chronic pulmonary edema: Secondary | ICD-10-CM

## 2021-04-22 DIAGNOSIS — Z8249 Family history of ischemic heart disease and other diseases of the circulatory system: Secondary | ICD-10-CM

## 2021-04-22 DIAGNOSIS — N185 Chronic kidney disease, stage 5: Secondary | ICD-10-CM | POA: Diagnosis present

## 2021-04-22 DIAGNOSIS — J9601 Acute respiratory failure with hypoxia: Secondary | ICD-10-CM

## 2021-04-22 DIAGNOSIS — N390 Urinary tract infection, site not specified: Secondary | ICD-10-CM | POA: Diagnosis present

## 2021-04-22 DIAGNOSIS — I5042 Chronic combined systolic (congestive) and diastolic (congestive) heart failure: Secondary | ICD-10-CM | POA: Diagnosis present

## 2021-04-22 DIAGNOSIS — Z9049 Acquired absence of other specified parts of digestive tract: Secondary | ICD-10-CM

## 2021-04-22 DIAGNOSIS — E1122 Type 2 diabetes mellitus with diabetic chronic kidney disease: Secondary | ICD-10-CM | POA: Diagnosis present

## 2021-04-22 DIAGNOSIS — R1310 Dysphagia, unspecified: Secondary | ICD-10-CM | POA: Diagnosis present

## 2021-04-22 DIAGNOSIS — J9621 Acute and chronic respiratory failure with hypoxia: Secondary | ICD-10-CM

## 2021-04-22 DIAGNOSIS — T17810A Gastric contents in other parts of respiratory tract causing asphyxiation, initial encounter: Secondary | ICD-10-CM | POA: Diagnosis present

## 2021-04-22 DIAGNOSIS — T17498A Other foreign object in trachea causing other injury, initial encounter: Secondary | ICD-10-CM | POA: Diagnosis present

## 2021-04-22 DIAGNOSIS — J9600 Acute respiratory failure, unspecified whether with hypoxia or hypercapnia: Secondary | ICD-10-CM

## 2021-04-22 DIAGNOSIS — N401 Enlarged prostate with lower urinary tract symptoms: Secondary | ICD-10-CM | POA: Diagnosis present

## 2021-04-22 DIAGNOSIS — J9690 Respiratory failure, unspecified, unspecified whether with hypoxia or hypercapnia: Secondary | ICD-10-CM

## 2021-04-22 DIAGNOSIS — N170 Acute kidney failure with tubular necrosis: Principal | ICD-10-CM | POA: Diagnosis present

## 2021-04-22 DIAGNOSIS — I429 Cardiomyopathy, unspecified: Secondary | ICD-10-CM | POA: Diagnosis present

## 2021-04-22 DIAGNOSIS — Z7951 Long term (current) use of inhaled steroids: Secondary | ICD-10-CM

## 2021-04-22 DIAGNOSIS — Z452 Encounter for adjustment and management of vascular access device: Secondary | ICD-10-CM | POA: Diagnosis not present

## 2021-04-22 DIAGNOSIS — E861 Hypovolemia: Secondary | ICD-10-CM | POA: Diagnosis present

## 2021-04-22 DIAGNOSIS — Z0189 Encounter for other specified special examinations: Secondary | ICD-10-CM

## 2021-04-22 DIAGNOSIS — R338 Other retention of urine: Secondary | ICD-10-CM | POA: Diagnosis present

## 2021-04-22 DIAGNOSIS — J690 Pneumonitis due to inhalation of food and vomit: Secondary | ICD-10-CM | POA: Diagnosis present

## 2021-04-22 DIAGNOSIS — G928 Other toxic encephalopathy: Secondary | ICD-10-CM | POA: Diagnosis present

## 2021-04-22 DIAGNOSIS — R053 Chronic cough: Secondary | ICD-10-CM | POA: Diagnosis present

## 2021-04-22 DIAGNOSIS — R578 Other shock: Secondary | ICD-10-CM | POA: Diagnosis not present

## 2021-04-22 DIAGNOSIS — J398 Other specified diseases of upper respiratory tract: Secondary | ICD-10-CM | POA: Diagnosis present

## 2021-04-22 DIAGNOSIS — F17210 Nicotine dependence, cigarettes, uncomplicated: Secondary | ICD-10-CM | POA: Diagnosis present

## 2021-04-22 DIAGNOSIS — N171 Acute kidney failure with acute cortical necrosis: Secondary | ICD-10-CM

## 2021-04-22 DIAGNOSIS — J449 Chronic obstructive pulmonary disease, unspecified: Secondary | ICD-10-CM | POA: Diagnosis present

## 2021-04-22 DIAGNOSIS — J9612 Chronic respiratory failure with hypercapnia: Secondary | ICD-10-CM | POA: Diagnosis present

## 2021-04-22 DIAGNOSIS — Z794 Long term (current) use of insulin: Secondary | ICD-10-CM

## 2021-04-22 DIAGNOSIS — Z515 Encounter for palliative care: Secondary | ICD-10-CM

## 2021-04-22 DIAGNOSIS — E785 Hyperlipidemia, unspecified: Secondary | ICD-10-CM | POA: Diagnosis present

## 2021-04-22 DIAGNOSIS — Z8616 Personal history of COVID-19: Secondary | ICD-10-CM | POA: Diagnosis not present

## 2021-04-22 DIAGNOSIS — F32A Depression, unspecified: Secondary | ICD-10-CM | POA: Diagnosis present

## 2021-04-22 DIAGNOSIS — N179 Acute kidney failure, unspecified: Secondary | ICD-10-CM

## 2021-04-22 DIAGNOSIS — E43 Unspecified severe protein-calorie malnutrition: Secondary | ICD-10-CM | POA: Diagnosis present

## 2021-04-22 DIAGNOSIS — H9190 Unspecified hearing loss, unspecified ear: Secondary | ICD-10-CM | POA: Diagnosis present

## 2021-04-22 DIAGNOSIS — K567 Ileus, unspecified: Secondary | ICD-10-CM | POA: Diagnosis present

## 2021-04-22 DIAGNOSIS — Z79899 Other long term (current) drug therapy: Secondary | ICD-10-CM

## 2021-04-22 DIAGNOSIS — I130 Hypertensive heart and chronic kidney disease with heart failure and stage 1 through stage 4 chronic kidney disease, or unspecified chronic kidney disease: Secondary | ICD-10-CM | POA: Diagnosis present

## 2021-04-22 DIAGNOSIS — Z905 Acquired absence of kidney: Secondary | ICD-10-CM | POA: Diagnosis not present

## 2021-04-22 DIAGNOSIS — J80 Acute respiratory distress syndrome: Secondary | ICD-10-CM | POA: Diagnosis present

## 2021-04-22 DIAGNOSIS — Z6825 Body mass index (BMI) 25.0-25.9, adult: Secondary | ICD-10-CM

## 2021-04-22 DIAGNOSIS — F419 Anxiety disorder, unspecified: Secondary | ICD-10-CM | POA: Diagnosis present

## 2021-04-22 DIAGNOSIS — I447 Left bundle-branch block, unspecified: Secondary | ICD-10-CM | POA: Diagnosis present

## 2021-04-22 DIAGNOSIS — Z809 Family history of malignant neoplasm, unspecified: Secondary | ICD-10-CM

## 2021-04-22 LAB — PHOSPHORUS: Phosphorus: 6.9 mg/dL — ABNORMAL HIGH (ref 2.5–4.6)

## 2021-04-22 LAB — BASIC METABOLIC PANEL
Anion gap: 8 (ref 5–15)
BUN: 114 mg/dL — ABNORMAL HIGH (ref 8–23)
CO2: 24 mmol/L (ref 22–32)
Calcium: 8.2 mg/dL — ABNORMAL LOW (ref 8.9–10.3)
Chloride: 99 mmol/L (ref 98–111)
Creatinine, Ser: 4.2 mg/dL — ABNORMAL HIGH (ref 0.61–1.24)
GFR, Estimated: 14 mL/min — ABNORMAL LOW (ref >60)
Glucose, Bld: 177 mg/dL — ABNORMAL HIGH (ref 70–99)
Potassium: 6.8 mmol/L (ref 3.5–5.1)
Sodium: 131 mmol/L — ABNORMAL LOW (ref 135–145)

## 2021-04-22 LAB — GLUCOSE, CAPILLARY
Glucose-Capillary: 179 mg/dL — ABNORMAL HIGH (ref 70–99)
Glucose-Capillary: 153 mg/dL — ABNORMAL HIGH (ref 70–99)
Glucose-Capillary: 117 mg/dL — ABNORMAL HIGH (ref 70–99)

## 2021-04-22 LAB — CBC
HCT: 36 % — ABNORMAL LOW (ref 39.0–52.0)
HCT: 39.1 % (ref 39.0–52.0)
Hemoglobin: 11.8 g/dL — ABNORMAL LOW (ref 13.0–17.0)
Hemoglobin: 13 g/dL (ref 13.0–17.0)
MCH: 28.2 pg (ref 26.0–34.0)
MCH: 28.7 pg (ref 26.0–34.0)
MCHC: 32.8 g/dL (ref 30.0–36.0)
MCHC: 33.2 g/dL (ref 30.0–36.0)
MCV: 86.1 fL (ref 80.0–100.0)
MCV: 86.3 fL (ref 80.0–100.0)
Platelets: 156 10*3/uL (ref 150–400)
Platelets: 164 10*3/uL (ref 150–400)
RBC: 4.18 MIL/uL — ABNORMAL LOW (ref 4.22–5.81)
RBC: 4.53 MIL/uL (ref 4.22–5.81)
RDW: 15.7 % — ABNORMAL HIGH (ref 11.5–15.5)
RDW: 15.8 % — ABNORMAL HIGH (ref 11.5–15.5)
WBC: 10.4 10*3/uL (ref 4.0–10.5)
WBC: 12.2 10*3/uL — ABNORMAL HIGH (ref 4.0–10.5)
nRBC: 0 % (ref 0.0–0.2)
nRBC: 0 % (ref 0.0–0.2)

## 2021-04-22 LAB — COMPREHENSIVE METABOLIC PANEL
ALT: 26 U/L (ref 0–44)
AST: 23 U/L (ref 15–41)
Albumin: 1.4 g/dL — ABNORMAL LOW (ref 3.5–5.0)
Alkaline Phosphatase: 61 U/L (ref 38–126)
Anion gap: 13 (ref 5–15)
BUN: 119 mg/dL — ABNORMAL HIGH (ref 8–23)
CO2: 24 mmol/L (ref 22–32)
Calcium: 7.6 mg/dL — ABNORMAL LOW (ref 8.9–10.3)
Chloride: 95 mmol/L — ABNORMAL LOW (ref 98–111)
Creatinine, Ser: 4.7 mg/dL — ABNORMAL HIGH (ref 0.61–1.24)
GFR, Estimated: 12 mL/min — ABNORMAL LOW (ref >60)
Glucose, Bld: 162 mg/dL — ABNORMAL HIGH (ref 70–99)
Potassium: 6.3 mmol/L (ref 3.5–5.1)
Sodium: 132 mmol/L — ABNORMAL LOW (ref 135–145)
Total Bilirubin: 1.1 mg/dL (ref 0.3–1.2)
Total Protein: 6.4 g/dL — ABNORMAL LOW (ref 6.5–8.1)

## 2021-04-22 LAB — BLOOD GAS, ARTERIAL
Acid-base deficit: 1.3 mmol/L (ref 0.0–2.0)
Bicarbonate: 24.5 mmol/L (ref 20.0–28.0)
FIO2: 90
O2 Saturation: 96 %
Patient temperature: 37
pCO2 arterial: 53.4 mmHg — ABNORMAL HIGH (ref 32.0–48.0)
pH, Arterial: 7.284 — ABNORMAL LOW (ref 7.350–7.450)
pO2, Arterial: 89.5 mmHg (ref 83.0–108.0)

## 2021-04-22 LAB — MAGNESIUM: Magnesium: 2.8 mg/dL — ABNORMAL HIGH (ref 1.7–2.4)

## 2021-04-22 LAB — LACTIC ACID, PLASMA
Lactic Acid, Venous: 1 mmol/L (ref 0.5–1.9)
Lactic Acid, Venous: 0.8 mmol/L (ref 0.5–1.9)

## 2021-04-22 LAB — POTASSIUM
Potassium: 6.5 mmol/L (ref 3.5–5.1)
Potassium: 6.3 mmol/L (ref 3.5–5.1)

## 2021-04-22 LAB — BRAIN NATRIURETIC PEPTIDE: B Natriuretic Peptide: 171.1 pg/mL — ABNORMAL HIGH (ref 0.0–100.0)

## 2021-04-22 MED ORDER — POLYETHYLENE GLYCOL 3350 17 G PO PACK: 17.0000 g | PACK | Freq: Every day | ORAL | Status: DC

## 2021-04-22 MED ORDER — FENTANYL CITRATE (PF) 100 MCG/2ML IJ SOLN: 25.0000 ug | INTRAMUSCULAR | Status: DC | PRN

## 2021-04-22 MED ORDER — HEPARIN SODIUM (PORCINE) 5000 UNIT/ML IJ SOLN
5000.0000 [IU] | Freq: Three times a day (TID) | INTRAMUSCULAR | Status: DC
  Administered 2021-04-22 – 2021-04-28 (×18): 5000 [IU] via SUBCUTANEOUS
  Filled 2021-04-22 (×19): qty 1

## 2021-04-22 MED ORDER — POLYETHYLENE GLYCOL 3350 17 G PO PACK
17.0000 g | PACK | Freq: Every day | ORAL | Status: DC
  Administered 2021-04-23: 17 g
  Filled 2021-04-22 (×2): qty 1

## 2021-04-22 MED ORDER — ETOMIDATE 2 MG/ML IV SOLN
INTRAVENOUS | Status: AC
  Administered 2021-04-22: 10 mg via INTRAVENOUS
  Filled 2021-04-22: qty 10

## 2021-04-22 MED ORDER — DOCUSATE SODIUM 50 MG/5ML PO LIQD
100.0000 mg | Freq: Two times a day (BID) | ORAL | Status: DC
  Administered 2021-04-22 – 2021-05-01 (×14): 100 mg
  Filled 2021-04-22 (×16): qty 10

## 2021-04-22 MED ORDER — SODIUM CHLORIDE 0.9 % IV BOLUS
500.0000 mL | Freq: Once | INTRAVENOUS | Status: AC
  Administered 2021-04-22: 500 mL via INTRAVENOUS

## 2021-04-22 MED ORDER — FENTANYL CITRATE (PF) 100 MCG/2ML IJ SOLN
100.0000 ug | Freq: Once | INTRAMUSCULAR | Status: AC
  Administered 2021-04-22: 100 ug via INTRAVENOUS

## 2021-04-22 MED ORDER — POLYETHYLENE GLYCOL 3350 17 G PO PACK: 17.0000 g | PACK | Freq: Every day | ORAL | Status: DC | PRN

## 2021-04-22 MED ORDER — ETOMIDATE 2 MG/ML IV SOLN: 20.0000 mg | Freq: Once | INTRAVENOUS | Status: DC | PRN

## 2021-04-22 MED ORDER — SODIUM ZIRCONIUM CYCLOSILICATE 5 G PO PACK
5.0000 g | PACK | Freq: Once | ORAL | Status: AC
  Administered 2021-04-22: 5 g
  Filled 2021-04-22: qty 1

## 2021-04-22 MED ORDER — ETOMIDATE 2 MG/ML IV SOLN: 10.0000 mg | Freq: Once | INTRAVENOUS | Status: AC

## 2021-04-22 MED ORDER — DEXMEDETOMIDINE HCL IN NACL 400 MCG/100ML IV SOLN
0.4000 ug/kg/h | INTRAVENOUS | Status: DC
  Administered 2021-04-24: 0.4 ug/kg/h via INTRAVENOUS

## 2021-04-22 MED ORDER — PANTOPRAZOLE SODIUM 40 MG IV SOLR
40.0000 mg | Freq: Every day | INTRAVENOUS | Status: DC
  Administered 2021-04-22: 40 mg via INTRAVENOUS
  Filled 2021-04-22: qty 40

## 2021-04-22 MED ORDER — DOCUSATE SODIUM 100 MG PO CAPS: 100.0000 mg | ORAL_CAPSULE | Freq: Two times a day (BID) | ORAL | Status: DC | PRN

## 2021-04-22 MED ORDER — FENTANYL CITRATE (PF) 100 MCG/2ML IJ SOLN: 25.0000 ug | Freq: Once | INTRAMUSCULAR | Status: DC

## 2021-04-22 MED ORDER — HEPARIN SODIUM (PORCINE) 1000 UNIT/ML IJ SOLN
INTRAMUSCULAR | Status: AC
  Filled 2021-04-22: qty 4

## 2021-04-22 MED ORDER — HEPARIN SODIUM (PORCINE) 1000 UNIT/ML IJ SOLN
5000.0000 [IU] | Freq: Once | INTRAMUSCULAR | Status: DC
  Filled 2021-04-22: qty 5

## 2021-04-22 MED ORDER — HEPARIN SODIUM (PORCINE) 1000 UNIT/ML DIALYSIS
1000.0000 [IU] | INTRAMUSCULAR | Status: DC | PRN
  Administered 2021-04-22: 2400 [IU] via INTRAVENOUS_CENTRAL
  Administered 2021-04-25: 1000 [IU] via INTRAVENOUS_CENTRAL
  Administered 2021-04-29: 2400 [IU] via INTRAVENOUS_CENTRAL
  Administered 2021-05-02 (×2): 1600 [IU] via INTRAVENOUS_CENTRAL
  Filled 2021-04-22 (×2): qty 6
  Filled 2021-04-22: qty 2
  Filled 2021-04-22: qty 6
  Filled 2021-04-22: qty 2
  Filled 2021-04-22 (×3): qty 6
  Filled 2021-04-22: qty 3
  Filled 2021-04-22: qty 6
  Filled 2021-04-22: qty 3

## 2021-04-22 MED ORDER — INSULIN ASPART 100 UNIT/ML IJ SOLN
0.0000 [IU] | INTRAMUSCULAR | Status: DC
  Administered 2021-04-22 (×2): 2 [IU] via SUBCUTANEOUS
  Administered 2021-04-25 – 2021-04-26 (×3): 1 [IU] via SUBCUTANEOUS
  Administered 2021-04-26: 2 [IU] via SUBCUTANEOUS
  Administered 2021-04-26: 1 [IU] via SUBCUTANEOUS
  Administered 2021-04-26: 2 [IU] via SUBCUTANEOUS
  Administered 2021-04-26 (×2): 1 [IU] via SUBCUTANEOUS
  Administered 2021-04-27 (×5): 2 [IU] via SUBCUTANEOUS
  Administered 2021-04-28 (×2): 1 [IU] via SUBCUTANEOUS
  Administered 2021-04-28: 2 [IU] via SUBCUTANEOUS
  Administered 2021-04-28: 1 [IU] via SUBCUTANEOUS
  Administered 2021-04-28: 2 [IU] via SUBCUTANEOUS
  Administered 2021-04-29 – 2021-04-30 (×7): 1 [IU] via SUBCUTANEOUS
  Administered 2021-04-30: 2 [IU] via SUBCUTANEOUS
  Administered 2021-05-01 (×2): 1 [IU] via SUBCUTANEOUS

## 2021-04-22 MED ORDER — DOCUSATE SODIUM 50 MG/5ML PO LIQD: 100.0000 mg | Freq: Two times a day (BID) | ORAL | Status: DC

## 2021-04-22 MED ORDER — FENTANYL BOLUS VIA INFUSION
25.0000 ug | INTRAVENOUS | Status: DC | PRN
  Filled 2021-04-22: qty 100

## 2021-04-22 MED ORDER — FENTANYL 2500MCG IN NS 250ML (10MCG/ML) PREMIX INFUSION
0.0000 ug/h | INTRAVENOUS | Status: DC
  Administered 2021-04-22: 150 ug/h via INTRAVENOUS
  Administered 2021-04-23 – 2021-04-27 (×9): 200 ug/h via INTRAVENOUS
  Administered 2021-04-27: 150 ug/h via INTRAVENOUS
  Administered 2021-04-28: 250 ug/h via INTRAVENOUS
  Administered 2021-04-28: 100 ug/h via INTRAVENOUS
  Administered 2021-04-29: 400 ug/h via INTRAVENOUS
  Administered 2021-04-29: 300 ug/h via INTRAVENOUS
  Administered 2021-04-29: 400 ug/h via INTRAVENOUS
  Administered 2021-04-29: 250 ug/h via INTRAVENOUS
  Administered 2021-04-30 (×3): 400 ug/h via INTRAVENOUS
  Administered 2021-05-01: 250 ug/h via INTRAVENOUS
  Administered 2021-05-01 – 2021-05-02 (×3): 300 ug/h via INTRAVENOUS
  Filled 2021-04-22 (×24): qty 250

## 2021-04-22 MED ORDER — SODIUM CHLORIDE 0.9 % IV SOLN: INTRAVENOUS | Status: DC

## 2021-04-22 NOTE — Progress Notes (Signed)
Pt arrived to unit, pt was not admitted in system. Pt was unable to vent to restlessness. Fentanyl infusion bag almost dry/empty. Unit manager was able to unit over-ride fentanyl injection per MD order at bedside. 166mg fentanyl given x2, SEE MAR. Was unable to link to override pull.

## 2021-04-22 NOTE — Procedures (Signed)
Central Venous Catheter Insertion Procedure Note  Kristopher [name]  PO:[id number]  Oct 24, 1945  Date:04/21/2021  Time:5:44 PM   Provider Performing:Burnis Kaser Jerilynn Mages Ayesha Rumpf   Procedure: Insertion of Non-tunneled Central Venous Catheter(36556)with US guidance [id number])    Indication(s) Difficult access and Hemodialysis  Consent Unable to obtain consent due to emergent nature of procedure.  Anesthesia Topical only with 1% lidocaine   Timeout Verified patient identification, verified procedure, site/side was marked, verified correct patient position, special equipment/implants available, medications/allergies/relevant history reviewed, required imaging and test results available.  Sterile Technique Maximal sterile technique including full sterile barrier drape, hand hygiene, sterile gown, sterile gloves, mask, hair covering, sterile ultrasound probe cover (if used).  Procedure Description Area of catheter insertion was cleaned with chlorhexidine and draped in sterile fashion.   With real-time ultrasound guidance a HD catheter (Trialysis) was placed into the right internal jugular vein.  Nonpulsatile blood flow and easy flushing noted in all ports.  The catheter was sutured in place and sterile dressing applied.  Complications/Tolerance None; patient tolerated the procedure well.  Chest X-ray is ordered to verify placement for internal jugular or subclavian cannulation.   EBL Minimal  Specimen(s) None  The entire procedure was supervised/proctored by Erskine Emery, MD.  Lestine Mount, PA-C [facility] 05/04/2021 5:45 PM  Please see [email or web address] for pager details.
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
## 2021-04-22 NOTE — Progress Notes (Signed)
[facility] Progress Note Patient Name: Kristopher [name] DOB: 07/24/1946 MRN: [id number]   Date of Service  04/21/2021  HPI/Events of Note  K+ 6.3, needs CXR checked following  Trialysis catheter insertion.  eICU Interventions  Catheter in good position, no pneumothorax. Lokelma 5 mg enterally ordered for K+ of 6.3, patient is also for urgent dialysis.        Kerry Kass Lydell Moga 05/12/2021, 8:13 PM

## 2021-04-22 NOTE — Consult Note (Addendum)
[name]  INPATIENT CONSULTATION  Reason for Consultation: AKI, refractory hyperkalemia Requesting Provider: Dr. Tamala Julian, PCCM  HPI: 75yo M with HTN, HFrEF, COPD, COVID 2020, h/o traumatic nephrectomy and splenectomy who is currently admitted for respiratory failure and is seen for evaluation and management of severe AKI and hyperkalemia.   Pt was admitted [name] 6/26 after aspirating while eating.  He failed extubation and was transferred to [name] for vent weaning and trach.  He has remained on significant vent support and O2 at [name], no trach yet.  During his [name] stay his Cr was inthe 1.5 range.  7/5 Cr was 1.4 > 7/7 2.2 > 7/10 3.56 > 4.2 with hyperkalemia as high as 7.1 which was medically managed but remains at 6.3 as of 1pm; repeat labs to be collected.  Per RN from [facility] he has started to have BMs since receiving kayexelate.  He had urinary retention yesterday with 553m on insertion of foley which remains in with UOP 1587mtoday.  He hasn't received any contrast and there is no report of frank hypotension.  A renal ultrasound shows R renal cyst o/w ok; L kidney absent. No recent UA.  Concern for ileus with distended abdomen.    Pt unable to provide and history or ROS due to status.   PMH: Past Medical History:  Diagnosis Date   Anxiety    takes Xanax prn   Cardiomyopathy    takes Digoxin daily   Depression    takes Prozac daily   Dyslipidemia    takes Simvastatin daily   HTN (hypertension)    takes Carvedilol and Lisinopril daily   Impaired hearing    left   PSH: Past Surgical History:  Procedure Laterality Date   CHOLECYSTECTOMY     > [age]yrgo   COLONOSCOPY     HERNIA REPAIR     kidney removed     but unsure of which one;>[age]yro   MASS EXCISION Left 06/14/2013   Procedure: EXCISION of left chest wall MASS;  Surgeon: BriaMadilyn Hook;  [facility];  Laterality: Left;   SPLENECTOMY     >51yr62yr    Past Medical History:   Diagnosis Date   Anxiety    takes Xanax prn   Cardiomyopathy    takes Digoxin daily   Depression    takes Prozac daily   Dyslipidemia    takes Simvastatin daily   HTN (hypertension)    takes Carvedilol and Lisinopril daily   Impaired hearing    left    Medications:  I have reviewed the patient's current medications. Medications Prior to Admission  Medication Sig Dispense Refill   ALPRAZolam (XANAX) 0.5 MG tablet Place 1 tablet (0.5 mg total) into feeding tube 3 (three) times daily.  0   alum & mag hydroxide-simeth (MAALOX/MYLANTA) 200-200-20 MG/5ML suspension Place 30 mLs into feeding tube every 6 (six) hours as needed for indigestion, heartburn or flatulence. 355 mL 0   amLODipine (NORVASC) 10 MG tablet Place 1 tablet (10 mg total) into feeding tube daily.     arformoterol (BROVANA) 15 MCG/2ML NEBU Take 2 mLs (15 mcg total) by nebulization 2 (two) times daily. 120 mL    budesonide (PULMICORT) 0.5 MG/2ML nebulizer solution Take 2 mLs (0.5 mg total) by nebulization 2 (two) times daily.  12   carisoprodol (SOMA) 350 MG tablet Place 1 tablet (350 mg total) into feeding tube at bedtime as needed (spasm). 30 tablet 0  carvedilol (COREG) 3.125 MG tablet Place 1 tablet (3.125 mg total) into feeding tube 2 (two) times daily with a meal.     ceFEPIme 2 g in sodium chloride 0.9 % 100 mL Inject 2 g into the vein every 12 (twelve) hours.     Chlorhexidine Gluconate Cloth 2 % PADS Apply 6 each topically daily.     chlorhexidine gluconate, MEDLINE KIT, (PERIDEX) 0.12 % solution 15 mLs by Mouth Rinse route 2 (two) times daily. 120 mL 0   docusate (COLACE) 50 MG/5ML liquid Place 10 mLs (100 mg total) into feeding tube 2 (two) times daily. 100 mL 0   fentaNYL (SUBLIMAZE) SOLN Inject 25-50 mcg into the vein every hour as needed (agitation  25 mcg for mild and 50 for moderate).  0   fentaNYL 10 mcg/ml SOLN infusion Inject 0-400 mcg/hr into the vein continuous.  0   FLUoxetine (PROZAC) 20 MG capsule  Place 1 capsule (20 mg total) into feeding tube daily.  3   guaiFENesin (ROBITUSSIN) 100 MG/5ML SOLN Place 10 mLs (200 mg total) into feeding tube every 4 (four) hours. 236 mL 0   heparin 5000 UNIT/ML injection Inject 1 mL (5,000 Units total) into the skin every 8 (eight) hours. 1 mL    hydrALAZINE (APRESOLINE) 25 MG tablet Place 1 tablet (25 mg total) into feeding tube every 8 (eight) hours as needed (SBP >180 or DBP >110).     insulin aspart (NOVOLOG) 100 UNIT/ML injection Inject 0-6 Units into the skin every 4 (four) hours. 10 mL 11   ipratropium (ATROVENT) 0.02 % nebulizer solution Inhale 2.5 mLs (0.5 mg total) into the lungs 3 (three) times daily. 75 mL 12   ipratropium-albuterol (DUONEB) 0.5-2.5 (3) MG/3ML SOLN Take 3 mLs by nebulization 4 (four) times daily as needed. 360 mL    loratadine (CLARITIN) 10 MG tablet Place 1 tablet (10 mg total) into feeding tube daily.     LORazepam (ATIVAN) 2 MG/ML injection Inject 0.25 mLs (0.5 mg total) into the vein every 6 (six) hours as needed for anxiety. 1 mL 0   metoCLOPramide (REGLAN) 5 MG/ML injection Inject 1 mL (5 mg total) into the vein every 8 (eight) hours as needed. 2 mL 0   Mouthwashes (MOUTH RINSE) LIQD solution 15 mLs by Mouth Rinse route every hour as needed.  0   Nutritional Supplements (FEEDING SUPPLEMENT, OSMOLITE 1.5 CAL,) LIQD Place 1,000 mLs into feeding tube continuous.  0   Nutritional Supplements (FEEDING SUPPLEMENT, PROSOURCE TF,) liquid Place 45 mLs into feeding tube 3 (three) times daily.     Nystatin (GERHARDT'S BUTT CREAM) CREA Apply 1 application topically as needed for irritation (MASD).     ondansetron (ZOFRAN) 4 MG/2ML SOLN injection Inject 2 mLs (4 mg total) into the vein every 6 (six) hours as needed for nausea or vomiting. 2 mL 0   pantoprazole sodium (PROTONIX) 40 mg/20 mL PACK Place 20 mLs (40 mg total) into feeding tube daily. 30 mL    phenol (CHLORASEPTIC) 1.4 % LIQD Use as directed 1 spray in the mouth or throat as  needed for throat irritation / pain.  0   polyethylene glycol (MIRALAX / GLYCOLAX) 17 g packet Place 17 g into feeding tube 2 (two) times daily. 14 each 0   senna-docusate (SENOKOT-S) 8.6-50 MG tablet Place 2 tablets into feeding tube 2 (two) times daily.     simvastatin (ZOCOR) 20 MG tablet Place 1 tablet (20 mg total) into feeding tube at bedtime. [place]  tablet 0   sodium chloride 0.9 % infusion Inject 250 mLs into the vein continuous.  0   Water For Irrigation, Sterile (FREE WATER) SOLN Place 150 mLs into feeding tube every 4 (four) hours.      ALLERGIES:  No Known Allergies  FAM HX: Family History  Problem Relation Age of Onset   Cancer Mother    Heart attack Sister    Heart attack Brother    Stroke Neg Hx     Social History:   reports that he has been smoking cigarettes. He has a 50.00 pack-year smoking history. He has never used smokeless tobacco. He reports that he does not drink alcohol and does not use drugs.  ROS: unable to obtain  Blood pressure 139/83, pulse 90, temperature 98.2 F (36.8 C), temperature source Axillary, resp. rate (!) 26, height _0  (1.753 m), SpO2 92 %. PHYSICAL EXAM: Gen: chronically ill appearing intubated and sedated but appearing uncomfortable  Eyes: anicteric ENT: MM dry around ETT Neck: supple, no JVD at 30 deg CV:  RRR, no rub Abd: mod distended, a few BS, non tender Lungs: coarse BL on 90% FIO2 GU: foley with fairly clear light yellow urine ~1m in bag Extr: dependent 1+ hip edema, no pedal edema Neuro: not following commands Skin: cool and dry. No rashes   Results for orders placed or performed during the [name] encounter of 04/17/21 (from the past 48 hour(s))  CBC     Status: Abnormal   Collection Time: 04/21/21  3:49 AM  Result Value Ref Range   WBC 10.6 (H) 4.0 - 10.5 K/uL   RBC 4.87 4.22 - 5.81 MIL/uL   Hemoglobin 13.7 13.0 - 17.0 g/dL   HCT 41.8 39.0 - 52.0 %   MCV 85.8 80.0 - 100.0 fL   MCH 28.1 26.0 - 34.0 pg   MCHC 32.8  30.0 - 36.0 g/dL   RDW 15.7 (H) 11.5 - 15.5 %   Platelets 144 (L) 150 - 400 K/uL    Comment: CONSISTENT WITH PREVIOUS RESULT REPEATED TO VERIFY    nRBC 0.0 0.0 - 0.2 %    Comment: Performed at [name] [address], [place] [id number] Basic metabolic panel     Status: Abnormal   Collection Time: 04/21/21  6:12 AM  Result Value Ref Range   Sodium 132 (L) 135 - 145 mmol/L   Potassium 6.9 (HH) 3.5 - 5.1 mmol/L    Comment: NO VISIBLE HEMOLYSIS CRITICAL RESULT CALLED TO, READ BACK BY AND VERIFIED WITH:  MJaclyn ShaggyRN _1  04/21/21 K. SANDERS     Chloride 101 98 - 111 mmol/L   CO2 23 22 - 32 mmol/L   Glucose, Bld 224 (H) 70 - 99 mg/dL    Comment: Glucose reference range applies only to samples taken after fasting for at least 8 hours.   BUN 96 (H) 8 - 23 mg/dL   Creatinine, Ser 3.56 (H) 0.61 - 1.24 mg/dL   Calcium 7.6 (L) 8.9 - 10.3 mg/dL   GFR, Estimated 17 (L) >60 mL/min    Comment: (NOTE) Calculated using the CKD-EPI Creatinine Equation (2021)    Anion gap 8 5 - 15    Comment: Performed at [facility], [place] [id number] Potassium     Status: Abnormal   Collection Time: 04/21/21  1:14 PM  Result Value Ref Range   Potassium 7.1 (HH) 3.5 - 5.1 mmol/L  Comment: CRITICAL RESULT CALLED TO, READ BACK BY AND VERIFIED WITH: K.CADET RN @ 7035 04/21/2021 BY C.EDENS Performed at [name], [place] [address], [place], [place] [address]   CBC     Status: Abnormal   Collection Time: 04/21/21 11:43 PM  Result Value Ref Range   WBC 10.4 4.0 - 10.5 K/uL   RBC 4.53 4.22 - 5.81 MIL/uL   Hemoglobin 13.0 13.0 - 17.0 g/dL   HCT 39.1 39.0 - 52.0 %   MCV 86.3 80.0 - 100.0 fL   MCH 28.7 26.0 - 34.0 pg   MCHC 33.2 30.0 - 36.0 g/dL   RDW 15.8 (H) 11.5 - 15.5 %   Platelets 164 150 - 400 K/uL   nRBC 0.0 0.0 - 0.2 %    Comment: Performed at [name], [place] [address], [place], [place] [address]  Basic metabolic panel     Status:  Abnormal   Collection Time: 04/21/21 11:43 PM  Result Value Ref Range   Sodium 131 (L) 135 - 145 mmol/L   Potassium 6.8 (HH) 3.5 - 5.1 mmol/L    Comment: NO VISIBLE HEMOLYSIS CRITICAL RESULT CALLED TO, READ BACK BY AND VERIFIED WITH:  Estanislado Emms RN _0  04/24/2021 K. SANDERS    Chloride 99 98 - 111 mmol/L   CO2 24 22 - 32 mmol/L   Glucose, Bld 177 (H) 70 - 99 mg/dL    Comment: Glucose reference range applies only to samples taken after fasting for at least 8 hours.   BUN 114 (H) 8 - 23 mg/dL   Creatinine, Ser 4.20 (H) 0.61 - 1.24 mg/dL   Calcium 8.2 (L) 8.9 - 10.3 mg/dL   GFR, Estimated 14 (L) >60 mL/min    Comment: (NOTE) Calculated using the CKD-EPI Creatinine Equation (2021)    Anion gap 8 5 - 15    Comment: Performed at [facility] [address], [place], [place] [address]  Potassium     Status: Abnormal   Collection Time: 05/03/2021  6:46 AM  Result Value Ref Range   Potassium 6.5 (HH) 3.5 - 5.1 mmol/L    Comment: NO VISIBLE HEMOLYSIS CRITICAL RESULT CALLED TO, READ BACK BY AND VERIFIED WITH: Y.CROSS,RN 6789 04/24/2021 CLARK,S Performed at [name], [place] [address] [place], [place] [address]   Blood gas, arterial     Status: Abnormal   Collection Time: 05/05/2021  9:20 AM  Result Value Ref Range   FIO2 90.00    pH, Arterial 7.284 (L) 7.350 - 7.450   pCO2 arterial 53.4 (H) 32.0 - 48.0 mmHg   pO2, Arterial 89.5 83.0 - 108.0 mmHg   Bicarbonate 24.5 20.0 - 28.0 mmol/L   Acid-base deficit 1.3 0.0 - 2.0 mmol/L   O2 Saturation 96.0 %   Patient temperature 37.0    Collection site LEFT RADIAL    Drawn by COLLECTED BY RT     Comment: J.GOLDHAM   Sample type ARTERIAL DRAW    Allens test (pass/fail) PASS PASS    Comment: Performed at [name], [place] [address] [place], [place] [address]  Potassium     Status: Abnormal   Collection Time: 05/07/2021  1:07 PM  Result Value Ref Range   Potassium 6.3 (HH) 3.5 - 5.1 mmol/L    Comment: NO VISIBLE  HEMOLYSIS CRITICAL RESULT CALLED TO, READ BACK BY AND VERIFIED WITH: Marvis Moeller [id number] 05/03/2021 WBOND Performed at [name], [place] [address]  [place], [place] [address]     DG Abd 1 View  Result Date: 04/21/2021 CLINICAL DATA:  Nausea and vomiting. EXAM: ABDOMEN - 1 VIEW COMPARISON:  Plain films of the abdomen and single view of the chest 04/17/2021. FINDINGS: Feeding tube is in place with the tip in the distal body of the stomach. Bowel gas pattern is nonobstructive. Multiple surgical clips are seen in the abdomen. Lung bases demonstrate airspace disease which appears improved compared to the prior chest film. IMPRESSION: No acute abnormality abdomen. Feeding tube tip is in the distal body of the stomach. Partial visualization of bibasilar airspace disease which appears improved. Electronically Signed   By: Inge Rise M.D.   On: 04/21/2021 17:07   US RENAL  Result Date: 04/21/2021 CLINICAL DATA:  Worsening renal function EXAM: RENAL / URINARY TRACT ULTRASOUND COMPLETE COMPARISON:  CT 10/02/2019 FINDINGS: Right Kidney: Renal measurements: 14.3 x 6.8 x 6.4 cm (volume = 330 cm^3). Echogenicity within normal limits. No hydronephrosis. No greater than 10 renal cysts visualized measuring up to 2.8 cm. Left Kidney: Surgically absent. Bladder: Not visualized. Other: None. IMPRESSION: 1. Multiple right renal cysts, no hydronephrosis. 2. Left kidney surgically absent. Electronically Signed   By: Dahlia Bailiff MD   On: 04/21/2021 18:49   DG CHEST PORT 1 VIEW  Result Date: 04/16/2021 CLINICAL DATA:  Respiratory failure. EXAM: PORTABLE CHEST 1 VIEW COMPARISON:  04/17/2021 FINDINGS: The endotracheal tube tip is above the carina. There is a feeding tube with tip below the field of GE junction. Stable cardiomediastinal contours. Diffuse interstitial and airspace disease is unchanged from previous exam. Bilateral pleural effusions are noted and appear decreased in volume from the previous study.  IMPRESSION: 1. Stable support apparatus. 2. Decrease in volume of bilateral pleural effusions. 3. No change in aeration to the lungs. Electronically Signed   By: Kerby Moors M.D.   On: 04/13/2021 09:38   DG Abd Portable 1V  Result Date: 04/21/2021 CLINICAL DATA:  Abdominal distension, ventilator dependent EXAM: PORTABLE ABDOMEN - 1 VIEW COMPARISON:  Portable exam 1556 hours compared to 04/21/2021 FINDINGS: Tip of feeding tube projects over gastric antrum. Gaseous distention of stomach with some gas present in the rectosigmoid colon as well. No bowel wall thickening or small bowel distention seen. 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 IMPRESSION: Nonobstructive bowel gas pattern. Electronically Signed   By: Lavonia Dana M.D.   On: 05/07/2021 16:37    Assessment/Plan **AKI: oliguric.  Multifactorial with respiratory failure secondary to aspiration, possibly sepsis, now with ileus and concern for hypovolemia.  Additionally report of urinary retention yesterday --Isotonic volume expansion challenge - small bolus given already tenuous pulmonary status --Check UA and FeNa --Maintain foley for now --F/u labs from this PM - may need to proceed to RRT as soon as tonight if indications --Avoid hypotension and nephrotoxins as able  **Hyperkalemia:  As high as 7.1 and not responding well as of 1pm to medical management.  Recheck has been ordered.  Given ileus would not give additional kayexelate.  Will f/u pm labs and decide if HD indicated immediately or if time to give volume challenge.  He has an HD line placed this PM by PCCM in place if needed.  Family hasn't been reachable but per chart [facility] MD had discussed RRT with family who were ok with it if needed.   **Hyponatremia: presumed hypovolemic, mild; follow with volume expansion.   **VDRF: vent support per PCCM; headed for trach soon.   **Abd distention  with suspected ileus: KUB pending; per above no more kayexelate if ileus.     **Acidosis, resp: 7.28 / 52, bicarb 24. Vent per primary  Justin Mend 04/25/2021, 5:42 PM

## 2021-04-23 ENCOUNTER — Inpatient Hospital Stay (HOSPITAL_COMMUNITY): Payer: Medicare Other

## 2021-04-23 DIAGNOSIS — N179 Acute kidney failure, unspecified: Secondary | ICD-10-CM | POA: Diagnosis not present

## 2021-04-23 DIAGNOSIS — N170 Acute kidney failure with tubular necrosis: Secondary | ICD-10-CM

## 2021-04-23 DIAGNOSIS — J449 Chronic obstructive pulmonary disease, unspecified: Secondary | ICD-10-CM

## 2021-04-23 DIAGNOSIS — I5032 Chronic diastolic (congestive) heart failure: Secondary | ICD-10-CM

## 2021-04-23 DIAGNOSIS — J690 Pneumonitis due to inhalation of food and vomit: Secondary | ICD-10-CM

## 2021-04-23 LAB — URINALYSIS, ROUTINE W REFLEX MICROSCOPIC
Bilirubin Urine: NEGATIVE
Glucose, UA: NEGATIVE mg/dL
Ketones, ur: NEGATIVE mg/dL
Nitrite: NEGATIVE
Protein, ur: 30 mg/dL — AB
RBC / HPF: >50 RBC/hpf — ABNORMAL HIGH (ref 0–5)
Specific Gravity, Urine: 1.011 (ref 1.005–1.030)
pH: 8 (ref 5.0–8.0)

## 2021-04-23 LAB — RENAL FUNCTION PANEL
Albumin: 1.4 g/dL — ABNORMAL LOW (ref 3.5–5.0)
Anion gap: 11 (ref 5–15)
BUN: 78 mg/dL — ABNORMAL HIGH (ref 8–23)
CO2: 26 mmol/L (ref 22–32)
Calcium: 7.4 mg/dL — ABNORMAL LOW (ref 8.9–10.3)
Chloride: 99 mmol/L (ref 98–111)
Creatinine, Ser: 3.92 mg/dL — ABNORMAL HIGH (ref 0.61–1.24)
GFR, Estimated: 15 mL/min — ABNORMAL LOW (ref >60)
Glucose, Bld: 93 mg/dL (ref 70–99)
Phosphorus: 6.4 mg/dL — ABNORMAL HIGH (ref 2.5–4.6)
Potassium: 5.1 mmol/L (ref 3.5–5.1)
Sodium: 136 mmol/L (ref 135–145)

## 2021-04-23 LAB — CBC
HCT: 36.3 % — ABNORMAL LOW (ref 39.0–52.0)
Hemoglobin: 12.1 g/dL — ABNORMAL LOW (ref 13.0–17.0)
MCH: 28.3 pg (ref 26.0–34.0)
MCHC: 33.3 g/dL (ref 30.0–36.0)
MCV: 85 fL (ref 80.0–100.0)
Platelets: 167 10*3/uL (ref 150–400)
RBC: 4.27 MIL/uL (ref 4.22–5.81)
RDW: 15.5 % (ref 11.5–15.5)
WBC: 11.8 10*3/uL — ABNORMAL HIGH (ref 4.0–10.5)
nRBC: 0 % (ref 0.0–0.2)

## 2021-04-23 LAB — BASIC METABOLIC PANEL
Anion gap: 9 (ref 5–15)
BUN: 65 mg/dL — ABNORMAL HIGH (ref 8–23)
CO2: 27 mmol/L (ref 22–32)
Calcium: 7.6 mg/dL — ABNORMAL LOW (ref 8.9–10.3)
Chloride: 99 mmol/L (ref 98–111)
Creatinine, Ser: 2.81 mg/dL — ABNORMAL HIGH (ref 0.61–1.24)
GFR, Estimated: 23 mL/min — ABNORMAL LOW (ref >60)
Glucose, Bld: 108 mg/dL — ABNORMAL HIGH (ref 70–99)
Potassium: 4.7 mmol/L (ref 3.5–5.1)
Sodium: 135 mmol/L (ref 135–145)

## 2021-04-23 LAB — SODIUM, URINE, RANDOM: Sodium, Ur: 69 mmol/L

## 2021-04-23 LAB — GLUCOSE, CAPILLARY
Glucose-Capillary: 120 mg/dL — ABNORMAL HIGH (ref 70–99)
Glucose-Capillary: 115 mg/dL — ABNORMAL HIGH (ref 70–99)
Glucose-Capillary: 89 mg/dL (ref 70–99)
Glucose-Capillary: 101 mg/dL — ABNORMAL HIGH (ref 70–99)
Glucose-Capillary: 84 mg/dL (ref 70–99)
Glucose-Capillary: 87 mg/dL (ref 70–99)

## 2021-04-23 LAB — MAGNESIUM: Magnesium: 2.2 mg/dL (ref 1.7–2.4)

## 2021-04-23 LAB — CREATININE, URINE, RANDOM: Creatinine, Urine: 29.99 mg/dL

## 2021-04-23 LAB — HEPATITIS B SURFACE ANTIGEN: Hepatitis B Surface Ag: NONREACTIVE

## 2021-04-23 LAB — PHOSPHORUS: Phosphorus: 4.3 mg/dL (ref 2.5–4.6)

## 2021-04-23 MED ORDER — SODIUM CHLORIDE 0.9% FLUSH
10.0000 mL | Freq: Two times a day (BID) | INTRAVENOUS | Status: DC
  Administered 2021-04-23 – 2021-04-28 (×11): 10 mL
  Administered 2021-04-29: 40 mL
  Administered 2021-04-30: 20 mL
  Administered 2021-05-01: 10 mL

## 2021-04-23 MED ORDER — CHLORHEXIDINE GLUCONATE CLOTH 2 % EX PADS
6.0000 | MEDICATED_PAD | Freq: Every day | CUTANEOUS | Status: DC
  Administered 2021-04-24 – 2021-05-01 (×9): 6 via TOPICAL

## 2021-04-23 MED ORDER — FUROSEMIDE 10 MG/ML IJ SOLN
120.0000 mg | Freq: Once | INTRAVENOUS | Status: AC
  Administered 2021-04-23: 120 mg via INTRAVENOUS
  Filled 2021-04-23: qty 2

## 2021-04-23 MED ORDER — DOXAZOSIN MESYLATE 2 MG PO TABS
2.0000 mg | ORAL_TABLET | Freq: Every day | ORAL | Status: DC
  Administered 2021-04-23 – 2021-04-30 (×8): 2 mg
  Filled 2021-04-23 (×9): qty 1

## 2021-04-23 MED ORDER — ONDANSETRON HCL 4 MG/2ML IJ SOLN: 4.0000 mg | Freq: Four times a day (QID) | INTRAMUSCULAR | Status: DC | PRN

## 2021-04-23 MED ORDER — ALPRAZOLAM 0.25 MG PO TABS
0.2500 mg | ORAL_TABLET | Freq: Three times a day (TID) | ORAL | Status: DC
  Administered 2021-04-23 – 2021-04-27 (×12): 0.25 mg
  Filled 2021-04-23 (×12): qty 1

## 2021-04-23 MED ORDER — SENNA 8.6 MG PO TABS
1.0000 | ORAL_TABLET | Freq: Every day | ORAL | Status: DC
  Administered 2021-04-23: 8.6 mg
  Filled 2021-04-23: qty 1

## 2021-04-23 MED ORDER — FLUOXETINE HCL 20 MG PO CAPS
20.0000 mg | ORAL_CAPSULE | Freq: Every day | ORAL | Status: DC
  Administered 2021-04-23 – 2021-05-01 (×9): 20 mg
  Filled 2021-04-23 (×10): qty 1

## 2021-04-23 MED ORDER — ORAL CARE MOUTH RINSE
15.0000 mL | OROMUCOSAL | Status: DC
  Administered 2021-04-23 – 2021-05-02 (×84): 15 mL via OROMUCOSAL

## 2021-04-23 MED ORDER — CHLORHEXIDINE GLUCONATE 0.12% ORAL RINSE (MEDLINE KIT)
15.0000 mL | Freq: Two times a day (BID) | OROMUCOSAL | Status: DC
  Administered 2021-04-23 – 2021-05-02 (×19): 15 mL via OROMUCOSAL

## 2021-04-23 MED ORDER — SODIUM CHLORIDE 0.9 % IV SOLN
1.0000 g | INTRAVENOUS | Status: AC
  Administered 2021-04-23 – 2021-04-25 (×3): 1 g via INTRAVENOUS
  Filled 2021-04-23 (×3): qty 10

## 2021-04-23 MED ORDER — PANTOPRAZOLE SODIUM 40 MG PO PACK
40.0000 mg | PACK | Freq: Every day | ORAL | Status: DC
  Administered 2021-04-23 – 2021-04-24 (×2): 40 mg
  Filled 2021-04-23 (×2): qty 20

## 2021-04-23 MED ORDER — SODIUM CHLORIDE 0.9% FLUSH
10.0000 mL | INTRAVENOUS | Status: DC | PRN
  Administered 2021-04-28: 10 mL

## 2021-04-23 NOTE — Progress Notes (Signed)
[facility] Progress Note Patient Name: VAHIN DISCHER DOB: 1946-01-25 MRN: [id number]   Date of Service  04/23/2021  HPI/Events of Note  EKG reviewed; Left bundle branch block, QTc 0.465.  eICU Interventions  Zofran ordered PRN nausea.        Kerry Kass Shakeena Kafer 04/23/2021, 5:42 AM

## 2021-04-23 NOTE — Progress Notes (Signed)
Cortrac and OGT both occluded at 2000 assessment. Pt's abd firm and distented. Multiple attempts made to unclog both tubes. Cortrac tube successfully unclogged. OGT unable to be unclogged. OGT replaced. Position confirmed with positive air bolus and immediate return of 200cc of thick brown gastric secretions. OGT placed on LIWS per MD order. Will continue to monitor.

## 2021-04-23 NOTE — Progress Notes (Signed)
[facility] Progress Note   Subjective:   Required HD overnight for refractory hyperkalemia.  Labs this AM better.  KUB with normal BG pattern yesterday. Nonsustained VT overnight. No UF with HD.   Objective Vitals:   04/23/21 1000 04/23/21 1100 04/23/21 1130 04/23/21 1200  BP: 112/62 (!) 148/75  (!) 149/73  Pulse: 81 96  97  Resp: (!) 25 (!) 24  20  Temp:   98.5 F (36.9 C)   TempSrc:   Axillary   SpO2: 95% 94% 95% 94%  Weight:      Height:       Physical Exam General: intubated, awake Heart: RRR Lungs: coarse ant Abdomen: soft, remains distended, +BS Extremities: trace diffuse edema Dialysis Access:  RIJ temp HD catheter  Additional Objective Labs: Basic Metabolic Panel: Recent Labs  Lab 04/21/21 2343 04/20/2021 0646 04/27/2021 1307 04/20/2021 1657 04/23/21 0143  NA 131*  --   --  132* 135  K 6.8*   < > 6.3* 6.3* 4.7  CL 99  --   --  95* 99  CO2 24  --   --  24 27  GLUCOSE 177*  --   --  162* 108*  BUN 114*  --   --  119* 65*  CREATININE 4.20*  --   --  4.70* 2.81*  CALCIUM 8.2*  --   --  7.6* 7.6*  PHOS  --   --   --  6.9* 4.3   < > = values in this interval not displayed.   Liver Function Tests: Recent Labs  Lab 04/18/21 0651 05/04/2021 1657  AST 26 23  ALT 24 26  ALKPHOS 67 61  BILITOT 0.8 1.1  PROT 6.4* 6.4*  ALBUMIN 1.5* 1.4*   No results for input(s): LIPASE, AMYLASE in the last 168 hours. CBC: Recent Labs  Lab 04/18/21 0651 04/21/21 0349 04/21/21 2343 05/08/2021 1657 04/23/21 0143  WBC 16.6* 10.6* 10.4 12.2* 11.8*  HGB 14.1 13.7 13.0 11.8* 12.1*  HCT 42.4 41.8 39.1 36.0* 36.3*  MCV 86.7 85.8 86.3 86.1 85.0  PLT 167 144* 164 156 167   Blood Culture    Component Value Date/Time   SDES TRACHEAL ASPIRATE 04/10/2021 1136   SPECREQUEST NONE 04/10/2021 1136   CULT  04/10/2021 1136    Normal respiratory flora-no Staph aureus or Pseudomonas seen Performed at [facility] [address] [place], [place] [address]     REPTSTATUS 04/12/2021 FINAL 04/10/2021 1136    Cardiac Enzymes: No results for input(s): CKTOTAL, CKMB, CKMBINDEX, TROPONINI in the last 168 hours. CBG: Recent Labs  Lab 04/19/2021 1915 05/04/2021 2318 04/23/21 0327 04/23/21 0724 04/23/21 1129  GLUCAP 153* 117* 120* 115* 89   Iron Studies: No results for input(s): IRON, TIBC, TRANSFERRIN, FERRITIN in the last 72 hours. '@lablastinr3'$ @ Studies/Results: DG Abd 1 View  Result Date: 04/21/2021 CLINICAL DATA:  Nausea and vomiting. EXAM: ABDOMEN - 1 VIEW COMPARISON:  Plain films of the abdomen and single view of the chest 04/17/2021. FINDINGS: Feeding tube is in place with the tip in the distal body of the stomach. Bowel gas pattern is nonobstructive. Multiple surgical clips are seen in the abdomen. Lung bases demonstrate airspace disease which appears improved compared to the prior chest film. IMPRESSION: No acute abnormality abdomen. Feeding tube tip is in the distal body of the stomach. Partial visualization of bibasilar airspace disease which appears improved. Electronically Signed   By: Inge Rise M.D.   On: 04/21/2021 17:07  US RENAL  Result Date: 04/21/2021 CLINICAL DATA:  Worsening renal function EXAM: RENAL / URINARY TRACT ULTRASOUND COMPLETE COMPARISON:  CT 10/02/2019 FINDINGS: Right Kidney: Renal measurements: 14.3 x 6.8 x 6.4 cm (volume = 330 cm^3). Echogenicity within normal limits. No hydronephrosis. No greater than 10 renal cysts visualized measuring up to 2.8 cm. Left Kidney: Surgically absent. Bladder: Not visualized. Other: None. IMPRESSION: 1. Multiple right renal cysts, no hydronephrosis. 2. Left kidney surgically absent. Electronically Signed   By: Dahlia Bailiff MD   On: 04/21/2021 18:49   DG Chest Port 1 View  Result Date: 04/23/2021 CLINICAL DATA:  Intubation, hypoxia EXAM: PORTABLE CHEST 1 VIEW COMPARISON:  Portable exam 0538 hours compared to 04/28/2021 FINDINGS: Feeding tube and nasogastric tube extends into stomach.  RIGHT jugular central venous catheter with tip projecting over SVC. Tip of endotracheal tube projects 5.7 cm above carina. Stable heart size mediastinal contours. Atherosclerotic calcification aorta. Diffuse BILATERAL pulmonary infiltrates unchanged, remaining greatest in mid RIGHT lung. No pleural effusion or pneumothorax. IMPRESSION: Persistent BILATERAL pulmonary infiltrates unchanged, question multifocal pneumonia versus pulmonary edema. Electronically Signed   By: Lavonia Dana M.D.   On: 04/23/2021 08:32   DG CHEST PORT 1 VIEW  Result Date: 04/21/2021 Rhae Lerner     05/05/2021  5:45 PM Central Venous Catheter Insertion Procedure Note DAVEYON PAYNE [id number] 05/07/1946 Date:04/28/2021 Time:5:44 PM Provider Performing:Stephanie Jerilynn Mages Ayesha Rumpf Procedure: Insertion of Non-tunneled Central Venous Catheter([id number])with US guidance [id number])  Indication(s) Difficult access and Hemodialysis Consent Unable to obtain consent due to emergent nature of procedure. Anesthesia Topical only with 1% lidocaine Timeout Verified patient identification, verified procedure, site/side was marked, verified correct patient position, special equipment/implants available, medications/allergies/relevant history reviewed, required imaging and test results available. Sterile Technique Maximal sterile technique including full sterile barrier drape, hand hygiene, sterile gown, sterile gloves, mask, hair covering, sterile ultrasound probe cover (if used). Procedure Description Area of catheter insertion was cleaned with chlorhexidine and draped in sterile fashion.   With real-time ultrasound guidance a HD catheter (Trialysis) was placed into the right internal jugular vein.  Nonpulsatile blood flow and easy flushing noted in all ports.  The catheter was sutured in place and sterile dressing applied. Complications/Tolerance None; patient tolerated the procedure well. Chest X-ray is ordered to verify placement for internal jugular or  subclavian cannulation. EBL Minimal Specimen(s) None The entire procedure was supervised/proctored by Erskine Emery, MD. Lestine Mount, PA-C [facility] 05/10/2021 5:45 PM Please see [email or web address] for pager details.   DG CHEST PORT 1 VIEW  Result Date: 05/07/2021 CLINICAL DATA:  Respiratory failure. EXAM: PORTABLE CHEST 1 VIEW COMPARISON:  04/17/2021 FINDINGS: The endotracheal tube tip is above the carina. There is a feeding tube with tip below the field of GE junction. Stable cardiomediastinal contours. Diffuse interstitial and airspace disease is unchanged from previous exam. Bilateral pleural effusions are noted and appear decreased in volume from the previous study. IMPRESSION: 1. Stable support apparatus. 2. Decrease in volume of bilateral pleural effusions. 3. No change in aeration to the lungs. Electronically Signed   By: Kerby Moors M.D.   On: 04/17/2021 09:38   DG Abd Portable 1V  Result Date: 04/19/2021 CLINICAL DATA:  Check gastric catheter placement EXAM: PORTABLE ABDOMEN - 1 VIEW COMPARISON:  Film from earlier in the same day. FINDINGS: Weighted feeding catheter is again noted in the distal stomach. Gastric catheter has been placed as well extending into the stomach. Scattered large and small bowel gas  is noted. Postsurgical changes are seen. IMPRESSION: Gastric catheter and weighted feeding catheter within the stomach. Electronically Signed   By: Inez Catalina M.D.   On: 05/01/2021 22:40   DG Abd Portable 1V  Result Date: 04/30/2021 CLINICAL DATA:  Abdominal distension, ventilator dependent EXAM: PORTABLE ABDOMEN - 1 VIEW COMPARISON:  Portable exam 1556 hours compared to 04/21/2021 FINDINGS: Tip of feeding tube projects over gastric antrum. Gaseous distention of stomach with some gas present in the rectosigmoid colon as well. No bowel wall thickening or small bowel distention seen. 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 IMPRESSION: Nonobstructive  bowel gas pattern. Electronically Signed   By: Lavonia Dana M.D.   On: 05/06/2021 16:37   Medications:  dexmedetomidine (PRECEDEX) IV infusion     fentaNYL infusion INTRAVENOUS 200 mcg/hr (04/23/21 1200)   furosemide      ALPRAZolam  0.25 mg Per Tube TID   Chlorhexidine Gluconate Cloth  6 each Topical Daily   docusate  100 mg Per Tube BID   doxazosin  2 mg Per Tube Daily   fentaNYL (SUBLIMAZE) injection  25 mcg Intravenous Once   FLUoxetine  20 mg Per Tube Daily   heparin  5,000 Units Subcutaneous Q8H   insulin aspart  0-9 Units Subcutaneous Q4H   pantoprazole sodium  40 mg Per Tube Daily   polyethylene glycol  17 g Per Tube Daily   senna  1 tablet Per Tube Daily   sodium chloride flush  10-40 mL Intracatheter Q12H    Assessment/Plan **AKI: oliguric.  Multifactorial with respiratory failure secondary to aspiration, possibly sepsis, urinary retention noted in [place] by RN.  Renal US - known solitary kidney with cysts noted < 10.  No hydronephrosis. --Required RRT overnight for K  -Making some urine now - trend labs and assess daily for ongoing needs for RRT -Primary to give lasix with edema - would dose judiciously and just PRN.  If urine sodium/FeNa low would hold the dose. --Check UA and FeNa - being sent now --Maintain foley for now --Avoid hypotension and nephrotoxins as able   **Hyperkalemia: Secondary to AKI, resolved with HD overnight.  Trend. Check K now.    **Hyponatremia: resolved with HD/volume    **VDRF: vent support per PCCM; headed for trach soon.   **Abd distention with suspected ileus: KUB OK. Per primary.   Jannifer Hick MD 04/23/2021, 1:31 PM  [facility] Pager: [phone]
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
## 2021-04-23 NOTE — Plan of Care (Signed)
  Problem: Clinical Measurements: Goal: Diagnostic test results will improve Outcome: Progressing   
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
## 2021-04-23 NOTE — Progress Notes (Signed)
[facility] Progress Note Patient Name: Kristopher [name] DOB: 25-Jan-1946 MRN: [id number]   Date of Service  04/23/2021  HPI/Events of Note  Patient with a 14 beat run  of wide complex tachycardia without hemodynamic compromise, QTc on EKG that followed the last run was 0.465 and electrolytes are within acceptable limits.  eICU Interventions  Bedside RN has been instructed to sign out the patient to the daytime PCCM attending physician for additional investigation regarding etiology of episodic wide complex tachycardia.        Kerry Kass Carmesha Morocco 04/23/2021, 6:56 AM

## 2021-04-24 DIAGNOSIS — N179 Acute kidney failure, unspecified: Secondary | ICD-10-CM | POA: Diagnosis not present

## 2021-04-24 LAB — CBC WITH DIFFERENTIAL/PLATELET
Abs Immature Granulocytes: 0.09 10*3/uL — ABNORMAL HIGH (ref 0.00–0.07)
Basophils Absolute: 0 10*3/uL (ref 0.0–0.1)
Basophils Relative: 0 %
Eosinophils Absolute: 0 10*3/uL (ref 0.0–0.5)
Eosinophils Relative: 0 %
HCT: 36 % — ABNORMAL LOW (ref 39.0–52.0)
Hemoglobin: 11.9 g/dL — ABNORMAL LOW (ref 13.0–17.0)
Immature Granulocytes: 1 %
Lymphocytes Relative: 10 %
Lymphs Abs: 1.2 10*3/uL (ref 0.7–4.0)
MCH: 28.5 pg (ref 26.0–34.0)
MCHC: 33.1 g/dL (ref 30.0–36.0)
MCV: 86.1 fL (ref 80.0–100.0)
Monocytes Absolute: 1.6 10*3/uL — ABNORMAL HIGH (ref 0.1–1.0)
Monocytes Relative: 14 %
Neutro Abs: 8.7 10*3/uL — ABNORMAL HIGH (ref 1.7–7.7)
Neutrophils Relative %: 75 %
Platelets: 148 10*3/uL — ABNORMAL LOW (ref 150–400)
RBC: 4.18 MIL/uL — ABNORMAL LOW (ref 4.22–5.81)
RDW: 15.4 % (ref 11.5–15.5)
WBC: 11.5 10*3/uL — ABNORMAL HIGH (ref 4.0–10.5)
nRBC: 0 % (ref 0.0–0.2)

## 2021-04-24 LAB — COMPREHENSIVE METABOLIC PANEL
ALT: 29 U/L (ref 0–44)
AST: 27 U/L (ref 15–41)
Albumin: 1.5 g/dL — ABNORMAL LOW (ref 3.5–5.0)
Alkaline Phosphatase: 73 U/L (ref 38–126)
Anion gap: 12 (ref 5–15)
BUN: 89 mg/dL — ABNORMAL HIGH (ref 8–23)
CO2: 24 mmol/L (ref 22–32)
Calcium: 7.4 mg/dL — ABNORMAL LOW (ref 8.9–10.3)
Chloride: 100 mmol/L (ref 98–111)
Creatinine, Ser: 4.51 mg/dL — ABNORMAL HIGH (ref 0.61–1.24)
GFR, Estimated: 13 mL/min — ABNORMAL LOW (ref >60)
Glucose, Bld: 79 mg/dL (ref 70–99)
Potassium: 4.7 mmol/L (ref 3.5–5.1)
Sodium: 136 mmol/L (ref 135–145)
Total Bilirubin: 0.8 mg/dL (ref 0.3–1.2)
Total Protein: 6.4 g/dL — ABNORMAL LOW (ref 6.5–8.1)

## 2021-04-24 LAB — MAGNESIUM: Magnesium: 2.3 mg/dL (ref 1.7–2.4)

## 2021-04-24 LAB — GLUCOSE, CAPILLARY
Glucose-Capillary: 85 mg/dL (ref 70–99)
Glucose-Capillary: 73 mg/dL (ref 70–99)
Glucose-Capillary: 66 mg/dL — ABNORMAL LOW (ref 70–99)
Glucose-Capillary: 159 mg/dL — ABNORMAL HIGH (ref 70–99)
Glucose-Capillary: 120 mg/dL — ABNORMAL HIGH (ref 70–99)
Glucose-Capillary: 115 mg/dL — ABNORMAL HIGH (ref 70–99)
Glucose-Capillary: 99 mg/dL (ref 70–99)

## 2021-04-24 LAB — HEPATITIS B SURFACE ANTIBODY, QUANTITATIVE: Hep B S AB Quant (Post): <3.1 m[IU]/mL — ABNORMAL LOW (ref >9.9)

## 2021-04-24 MED ORDER — FENTANYL BOLUS VIA INFUSION
200.0000 ug | INTRAVENOUS | Status: DC | PRN
  Administered 2021-04-24 – 2021-05-01 (×20): 200 ug via INTRAVENOUS
  Filled 2021-04-24: qty 200

## 2021-04-24 MED ORDER — DEXTROSE 50 % IV SOLN
INTRAVENOUS | Status: AC
  Administered 2021-04-24: 50 mL
  Filled 2021-04-24: qty 50

## 2021-04-24 MED ORDER — FUROSEMIDE 10 MG/ML IJ SOLN
120.0000 mg | Freq: Once | INTRAVENOUS | Status: AC
  Administered 2021-04-24: 120 mg via INTRAVENOUS
  Filled 2021-04-24: qty 2

## 2021-04-24 MED ORDER — SENNA 8.6 MG PO TABS
1.0000 | ORAL_TABLET | Freq: Two times a day (BID) | ORAL | Status: DC
  Administered 2021-04-24 – 2021-05-01 (×12): 8.6 mg
  Filled 2021-04-24 (×11): qty 1

## 2021-04-24 MED ORDER — POLYETHYLENE GLYCOL 3350 17 G PO PACK
17.0000 g | PACK | Freq: Two times a day (BID) | ORAL | Status: DC
  Administered 2021-04-24 – 2021-05-01 (×8): 17 g
  Filled 2021-04-24 (×9): qty 1

## 2021-04-24 NOTE — Progress Notes (Signed)
Goal of care for the shift is promote good pulmonary hygiene to try to mobilize and clear secretions, while maintaining good ventilation and optimize oxygenation via utilization of mechanical ventilation using appropriate settings to meet systemic and myocardial oxygen demands. Patient is on increase oxygenation support settings on the vent. Oxygenation weaning will be initiated as tolerated. Patient is stable at this time.   Kristopher [name] L. Tamala Julian, BS, RRT-ACCS, RCP

## 2021-04-24 NOTE — Progress Notes (Signed)
[facility] Progress Note   Subjective:   NG tube to suction with fairly copious outpt.  UOP 525 yesterday, redosed with lasix 120 IV today. HD stable.  Plans for trach soon.   Objective Vitals:   04/24/21 0719 04/24/21 0739 04/24/21 0800 04/24/21 0900  BP:  (!) 144/67 (!) 150/61 (!) 151/83  Pulse:  96 (!) 110 (!) 110  Resp:  (!) 32 (!) 31 (!) 29  Temp: 97.9 F (36.6 C)     TempSrc: Oral     SpO2:  92% 92% 92%  Weight:      Height:       Physical Exam General: intubated, awake Heart: RRR Lungs: coarse ant Abdomen: remains distended Extremities: 1+  diffuse edema Dialysis Access:  RIJ temp HD catheter  Additional Objective Labs: Basic Metabolic Panel: Recent Labs  Lab 04/21/2021 1657 04/23/21 0143 04/23/21 1354 04/24/21 0257  NA 132* 135 136 136  K 6.3* 4.7 5.1 4.7  CL 95* 99 99 100  CO2 24 27 26 24   GLUCOSE 162* 108* 93 79  BUN 119* 65* 78* 89*  CREATININE 4.70* 2.81* 3.92* 4.51*  CALCIUM 7.6* 7.6* 7.4* 7.4*  PHOS 6.9* 4.3 6.4*  --     Liver Function Tests: Recent Labs  Lab 04/18/21 0651 05/03/2021 1657 04/23/21 1354 04/24/21 0257  AST 26 23  --  27  ALT 24 26  --  29  ALKPHOS 67 61  --  73  BILITOT 0.8 1.1  --  0.8  PROT 6.4* 6.4*  --  6.4*  ALBUMIN 1.5* 1.4* 1.4* 1.5*    No results for input(s): LIPASE, AMYLASE in the last 168 hours. CBC: Recent Labs  Lab 04/21/21 0349 04/21/21 2343 05/03/2021 1657 04/23/21 0143 04/24/21 0257  WBC 10.6* 10.4 12.2* 11.8* 11.5*  NEUTROABS  --   --   --   --  8.7*  HGB 13.7 13.0 11.8* 12.1* 11.9*  HCT 41.8 39.1 36.0* 36.3* 36.0*  MCV 85.8 86.3 86.1 85.0 86.1  PLT 144* 164 156 167 148*    Blood Culture    Component Value Date/Time   SDES TRACHEAL ASPIRATE 04/23/2021 1531   SPECREQUEST NONE 04/23/2021 1531   CULT  04/23/2021 1531    TOO YOUNG TO READ Performed at [facility], [address], [place], [place] [address]    REPTSTATUS PENDING 04/23/2021 1531    Cardiac Enzymes: No  results for input(s): CKTOTAL, CKMB, CKMBINDEX, TROPONINI in the last 168 hours. CBG: Recent Labs  Lab 04/23/21 1519 04/23/21 1943 04/23/21 2327 04/24/21 0306 04/24/21 0715  GLUCAP 101* 84 87 85 73    Iron Studies: No results for input(s): IRON, TIBC, TRANSFERRIN, FERRITIN in the last 72 hours. @lablastinr3 @ Studies/Results: DG Chest Port 1 View  Result Date: 04/23/2021 CLINICAL DATA:  Intubation, hypoxia EXAM: PORTABLE CHEST 1 VIEW COMPARISON:  Portable exam 0538 hours compared to 04/17/2021 FINDINGS: Feeding tube and nasogastric tube extends into stomach. RIGHT jugular central venous catheter with tip projecting over SVC. Tip of endotracheal tube projects 5.7 cm above carina. Stable heart size mediastinal contours. Atherosclerotic calcification aorta. Diffuse BILATERAL pulmonary infiltrates unchanged, remaining greatest in mid RIGHT lung. No pleural effusion or pneumothorax. IMPRESSION: Persistent BILATERAL pulmonary infiltrates unchanged, question multifocal pneumonia versus pulmonary edema. Electronically Signed   By: Lavonia Dana M.D.   On: 04/23/2021 08:32   DG CHEST PORT 1 VIEW  Result Date: 04/16/2021 Rhae Lerner     05/11/2021  5:45 PM  Central Venous Catheter Insertion Procedure Note [name] [id number] [birth year] Date:04/13/2021 Time:5:44 PM Provider Performing:Stephanie Jerilynn Mages Ayesha Rumpf Procedure: Insertion of Non-tunneled Central Venous Catheter(36556)with US guidance (42683)  Indication(s) Difficult access and Hemodialysis Consent Unable to obtain consent due to emergent nature of procedure. Anesthesia Topical only with 1% lidocaine Timeout Verified patient identification, verified procedure, site/side was marked, verified correct patient position, special equipment/implants available, medications/allergies/relevant history reviewed, required imaging and test results available. Sterile Technique Maximal sterile technique including full sterile barrier drape, hand hygiene,  sterile gown, sterile gloves, mask, hair covering, sterile ultrasound probe cover (if used). Procedure Description Area of catheter insertion was cleaned with chlorhexidine and draped in sterile fashion.   With real-time ultrasound guidance a HD catheter (Trialysis) was placed into the right internal jugular vein.  Nonpulsatile blood flow and easy flushing noted in all ports.  The catheter was sutured in place and sterile dressing applied. Complications/Tolerance None; patient tolerated the procedure well. Chest X-ray is ordered to verify placement for internal jugular or subclavian cannulation. EBL Minimal Specimen(s) None The entire procedure was supervised/proctored by Erskine Emery, MD. Lestine Mount, PA-C [facility] 04/15/2021 5:45 PM Please see [email or web address] for pager details.   DG Abd Portable 1V  Result Date: 04/29/2021 CLINICAL DATA:  Check gastric catheter placement EXAM: PORTABLE ABDOMEN - 1 VIEW COMPARISON:  Film from earlier in the same day. FINDINGS: Weighted feeding catheter is again noted in the distal stomach. Gastric catheter has been placed as well extending into the stomach. Scattered large and small bowel gas is noted. Postsurgical changes are seen. IMPRESSION: Gastric catheter and weighted feeding catheter within the stomach. Electronically Signed   By: Inez Catalina M.D.   On: 04/12/2021 22:40   DG Abd Portable 1V  Result Date: 04/23/2021 CLINICAL DATA:  Abdominal distension, ventilator dependent EXAM: PORTABLE ABDOMEN - 1 VIEW COMPARISON:  Portable exam 1556 hours compared to 04/21/2021 FINDINGS: Tip of feeding tube projects over gastric antrum. Gaseous distention of stomach with some gas present in the rectosigmoid colon as well. No bowel wall thickening or small bowel distention seen. 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 IMPRESSION: Nonobstructive bowel gas pattern. Electronically Signed   By: Lavonia Dana M.D.   On: 05/09/2021 16:37    Medications:  cefTRIAXone (ROCEPHIN)  IV Stopped (04/23/21 1721)   dexmedetomidine (PRECEDEX) IV infusion     fentaNYL infusion INTRAVENOUS 200 mcg/hr (04/24/21 0900)   furosemide 120 mg (04/24/21 0933)    ALPRAZolam  0.25 mg Per Tube TID   chlorhexidine gluconate (MEDLINE KIT)  15 mL Mouth Rinse BID   Chlorhexidine Gluconate Cloth  6 each Topical Daily   docusate  100 mg Per Tube BID   doxazosin  2 mg Per Tube Daily   fentaNYL (SUBLIMAZE) injection  25 mcg Intravenous Once   FLUoxetine  20 mg Per Tube Daily   heparin  5,000 Units Subcutaneous Q8H   insulin aspart  0-9 Units Subcutaneous Q4H   mouth rinse  15 mL Mouth Rinse 10 times per day   pantoprazole sodium  40 mg Per Tube Daily   polyethylene glycol  17 g Per Tube BID   senna  1 tablet Per Tube BID   sodium chloride flush  10-40 mL Intracatheter Q12H    Assessment/Plan **AKI: oliguric.  Multifactorial with respiratory failure secondary to aspiration, possibly sepsis, urinary retention noted in [facility] by RN.  Renal US - known solitary kidney with cysts noted < 10.  No  hydronephrosis. UA from foley with blood, 1+ protein, + WBC, FeNa not consistent with prerenal --Required RRT early AM 7/12 for refractory K --Making some urine now - trend labs and assess daily for ongoing needs for RRT - likely will need tomorrow 7/14 for clearance -Agree with redose lasix this AM, cont with just PRN dosing --Maintain foley for now --Avoid hypotension and nephrotoxins as able   **Hyperkalemia: Has been ok after HD, cont to trend   **Hyponatremia: resolved with HD/volume    **VDRF: vent support per PCCM; headed for trach soon.   **Abd distention with suspected ileus: NG on LIWS now  Jannifer Hick MD 04/24/2021, 10:07 AM  [facility] Pager: [phone]
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
## 2021-04-25 DIAGNOSIS — N179 Acute kidney failure, unspecified: Secondary | ICD-10-CM | POA: Diagnosis not present

## 2021-04-25 LAB — CBC
HCT: 32 % — ABNORMAL LOW (ref 39.0–52.0)
Hemoglobin: 10.5 g/dL — ABNORMAL LOW (ref 13.0–17.0)
MCH: 28.2 pg (ref 26.0–34.0)
MCHC: 32.8 g/dL (ref 30.0–36.0)
MCV: 85.8 fL (ref 80.0–100.0)
Platelets: 110 10*3/uL — ABNORMAL LOW (ref 150–400)
RBC: 3.73 MIL/uL — ABNORMAL LOW (ref 4.22–5.81)
RDW: 15.6 % — ABNORMAL HIGH (ref 11.5–15.5)
WBC: 10.6 10*3/uL — ABNORMAL HIGH (ref 4.0–10.5)
nRBC: 0 % (ref 0.0–0.2)

## 2021-04-25 LAB — POCT I-STAT 7, (LYTES, BLD GAS, ICA,H+H)
Acid-Base Excess: 0 mmol/L (ref 0.0–2.0)
Bicarbonate: 27.7 mmol/L (ref 20.0–28.0)
Calcium, Ion: 1.08 mmol/L — ABNORMAL LOW (ref 1.15–1.40)
HCT: 36 % — ABNORMAL LOW (ref 39.0–52.0)
Hemoglobin: 12.2 g/dL — ABNORMAL LOW (ref 13.0–17.0)
O2 Saturation: 92 %
Patient temperature: 97.6
Potassium: 3.6 mmol/L (ref 3.5–5.1)
Sodium: 140 mmol/L (ref 135–145)
TCO2: 29 mmol/L (ref 22–32)
pCO2 arterial: 59.1 mmHg — ABNORMAL HIGH (ref 32.0–48.0)
pH, Arterial: 7.275 — ABNORMAL LOW (ref 7.350–7.450)
pO2, Arterial: 73 mmHg — ABNORMAL LOW (ref 83.0–108.0)

## 2021-04-25 LAB — MAGNESIUM: Magnesium: 2.5 mg/dL — ABNORMAL HIGH (ref 1.7–2.4)

## 2021-04-25 LAB — GLUCOSE, CAPILLARY
Glucose-Capillary: 96 mg/dL (ref 70–99)
Glucose-Capillary: 80 mg/dL (ref 70–99)
Glucose-Capillary: 77 mg/dL (ref 70–99)
Glucose-Capillary: 83 mg/dL (ref 70–99)
Glucose-Capillary: 107 mg/dL — ABNORMAL HIGH (ref 70–99)
Glucose-Capillary: 138 mg/dL — ABNORMAL HIGH (ref 70–99)
Glucose-Capillary: 137 mg/dL — ABNORMAL HIGH (ref 70–99)

## 2021-04-25 LAB — RENAL FUNCTION PANEL
Albumin: 1.3 g/dL — ABNORMAL LOW (ref 3.5–5.0)
Anion gap: 13 (ref 5–15)
BUN: 101 mg/dL — ABNORMAL HIGH (ref 8–23)
CO2: 24 mmol/L (ref 22–32)
Calcium: 7 mg/dL — ABNORMAL LOW (ref 8.9–10.3)
Chloride: 101 mmol/L (ref 98–111)
Creatinine, Ser: 5.36 mg/dL — ABNORMAL HIGH (ref 0.61–1.24)
GFR, Estimated: 10 mL/min — ABNORMAL LOW (ref >60)
Glucose, Bld: 88 mg/dL (ref 70–99)
Phosphorus: 10.5 mg/dL — ABNORMAL HIGH (ref 2.5–4.6)
Potassium: 4.5 mmol/L (ref 3.5–5.1)
Sodium: 138 mmol/L (ref 135–145)

## 2021-04-25 LAB — APTT: aPTT: 41 seconds — ABNORMAL HIGH (ref 24–36)

## 2021-04-25 LAB — PROTIME-INR
INR: 1.4 — ABNORMAL HIGH (ref 0.8–1.2)
Prothrombin Time: 16.8 seconds — ABNORMAL HIGH (ref 11.4–15.2)

## 2021-04-25 MED ORDER — MIDAZOLAM HCL 2 MG/2ML IJ SOLN
2.0000 mg | INTRAMUSCULAR | Status: DC | PRN
  Administered 2021-04-26 – 2021-04-29 (×6): 2 mg via INTRAVENOUS
  Filled 2021-04-25 (×7): qty 2

## 2021-04-25 MED ORDER — POLYETHYLENE GLYCOL 3350 17 G PO PACK
17.0000 g | PACK | Freq: Every day | ORAL | Status: DC | PRN
Start: 2021-04-25 — End: 2021-05-02

## 2021-04-25 MED ORDER — DOCUSATE SODIUM 50 MG/5ML PO LIQD: 100.0000 mg | Freq: Two times a day (BID) | ORAL | Status: DC | PRN

## 2021-04-25 MED ORDER — PANTOPRAZOLE SODIUM 40 MG IV SOLR
40.0000 mg | Freq: Two times a day (BID) | INTRAVENOUS | Status: DC
  Administered 2021-04-25 – 2021-04-27 (×6): 40 mg via INTRAVENOUS
  Filled 2021-04-25 (×6): qty 40

## 2021-04-25 MED ORDER — PROSOURCE TF PO LIQD
45.0000 mL | Freq: Three times a day (TID) | ORAL | Status: DC
  Administered 2021-04-25 – 2021-04-29 (×12): 45 mL
  Filled 2021-04-25 (×12): qty 45

## 2021-04-25 MED ORDER — MIDAZOLAM HCL 2 MG/2ML IJ SOLN
4.0000 mg | Freq: Once | INTRAMUSCULAR | Status: AC
  Administered 2021-04-25: 4 mg via INTRAVENOUS
  Filled 2021-04-25: qty 4

## 2021-04-25 MED ORDER — HEPARIN SODIUM (PORCINE) 1000 UNIT/ML IJ SOLN
INTRAMUSCULAR | Status: AC
  Filled 2021-04-25: qty 4

## 2021-04-25 MED ORDER — OSMOLITE 1.5 CAL PO LIQD
1000.0000 mL | ORAL | Status: DC
  Administered 2021-04-25: 1000 mL
  Filled 2021-04-25: qty 1000

## 2021-04-25 NOTE — Progress Notes (Addendum)
[facility] Progress Note Patient Name: [name] DOB: 1946/04/20 MRN: [id number]   Date of Service  04/25/2021  HPI/Events of Note  Patient had a 22 beat run of wide complex tachycardia with spontaneous termination, he's also had bloody appearing secretions from his NG tube which was on LIWS.  eICU Interventions  Protonix 40 mg bid ordered, CBC, electrolytes have been drawn and sent, will add Mg++. Will also screen for coagulopathy with PT-INR and PTT.        Kerry Kass Rosemae Mcquown 04/25/2021, 1:17 AM
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
## 2021-04-25 NOTE — Progress Notes (Signed)
[facility] Progress Note   Subjective:   UOP 500 yesterday with lasix 120 IV.  Hemodynamically stable. Remains on 70% FiO2.  Plans for trach soon.  HD this AM.   Objective Vitals:   04/25/21 0900 04/25/21 0904 04/25/21 0915 04/25/21 0930  BP: (!) 144/64  (!) 150/65 (!) 142/56  Pulse: 96 (!) 111 (!) 103 98  Resp: (!) 29 (!) 24 (!) 28 (!) 26  Temp:      TempSrc:      SpO2: 94% 94% 92% 90%  Weight:      Height:       Physical Exam General: intubated, awake, diaphoretic with normal BG Heart: RRR Lungs: coarse ant Abdomen: remains distended Extremities: 1+  diffuse edema Dialysis Access:  RIJ temp HD catheter  Additional Objective Labs: Basic Metabolic Panel: Recent Labs  Lab 04/23/21 0143 04/23/21 1354 04/24/21 0257 04/25/21 0040  NA 135 136 136 138  K 4.7 5.1 4.7 4.5  CL 99 99 100 101  CO2 _0 GLUCOSE 108* 93 79 88  BUN 65* 78* 89* 101*  CREATININE 2.81* 3.92* 4.51* 5.36*  CALCIUM 7.6* 7.4* 7.4* 7.0*  PHOS 4.3 6.4*  --  10.5*    Liver Function Tests: Recent Labs  Lab 04/21/2021 1657 04/23/21 1354 04/24/21 0257 04/25/21 0040  AST 23  --  27  --   ALT 26  --  29  --   ALKPHOS 61  --  73  --   BILITOT 1.1  --  0.8  --   PROT 6.4*  --  6.4*  --   ALBUMIN 1.4* 1.4* 1.5* 1.3*    No results for input(s): LIPASE, AMYLASE in the last 168 hours. CBC: Recent Labs  Lab 04/21/21 2343 04/15/2021 1657 04/23/21 0143 04/24/21 0257 04/25/21 0040  WBC 10.4 12.2* 11.8* 11.5* 10.6*  NEUTROABS  --   --   --  8.7*  --   HGB 13.0 11.8* 12.1* 11.9* 10.5*  HCT 39.1 36.0* 36.3* 36.0* 32.0*  MCV 86.3 86.1 85.0 86.1 85.8  PLT 164 156 167 148* 110*    Blood Culture    Component Value Date/Time   SDES TRACHEAL ASPIRATE 04/23/2021 1531   SPECREQUEST NONE 04/23/2021 1531   CULT  04/23/2021 1531    TOO YOUNG TO READ Performed at [facility], [address] [place], [place] [address]    REPTSTATUS PENDING 04/23/2021 1531    Cardiac  Enzymes: No results for input(s): CKTOTAL, CKMB, CKMBINDEX, TROPONINI in the last 168 hours. CBG: Recent Labs  Lab 04/24/21 1501 04/24/21 2000 04/24/21 2321 04/25/21 0329 04/25/21 0721  GLUCAP 120* 115* 99 96 80    Iron Studies: No results for input(s): IRON, TIBC, TRANSFERRIN, FERRITIN in the last 72 hours. _1 @ Studies/Results: No results found. Medications:  cefTRIAXone (ROCEPHIN)  IV Stopped (04/24/21 1739)   feeding supplement (OSMOLITE 1.5 CAL)     fentaNYL infusion INTRAVENOUS 200 mcg/hr (04/25/21 0800)    heparin sodium (porcine)       ALPRAZolam  0.25 mg Per Tube TID   chlorhexidine gluconate (MEDLINE KIT)  15 mL Mouth Rinse BID   Chlorhexidine Gluconate Cloth  6 each Topical Daily   docusate  100 mg Per Tube BID   doxazosin  2 mg Per Tube Daily   feeding supplement (PROSource TF)  45 mL Per Tube TID   fentaNYL (SUBLIMAZE) injection  25 mcg Intravenous Once   FLUoxetine  20 mg Per Tube  Daily   heparin  5,000 Units Subcutaneous Q8H   insulin aspart  0-9 Units Subcutaneous Q4H   mouth rinse  15 mL Mouth Rinse 10 times per day   pantoprazole (PROTONIX) IV  40 mg Intravenous Q12H   polyethylene glycol  17 g Per Tube BID   senna  1 tablet Per Tube BID   sodium chloride flush  10-40 mL Intracatheter Q12H    Assessment/Plan **AKI: oliguric.  Multifactorial with respiratory failure secondary to aspiration, possibly sepsis, urinary retention noted in [facility] by RN.  Renal US - known solitary kidney with cysts noted < 10.  No hydronephrosis. UA from foley with blood, 1+ protein, + WBC, FeNa not consistent with prerenal --Required RRT early AM 7/12 for refractory K --Making some urine now but labs show continued azotemia - HD today for clearance with gentle UF of 1L as tolerated --OK with PRN lasix dosing  --Maintain foley for now --Avoid hypotension and nephrotoxins as able   **Hyperkalemia: Has been ok after HD, cont to trend   **Hyponatremia: resolved with  HD/volume    **VDRF: vent support per PCCM; headed for trach soon.   **Abd distention with suspected ileus: NG on LIWS now  Jannifer Hick MD 04/25/2021, 10:01 AM  [facility] Pager: [phone]
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
## 2021-04-26 ENCOUNTER — Inpatient Hospital Stay (HOSPITAL_COMMUNITY): Payer: Medicare Other

## 2021-04-26 DIAGNOSIS — J9621 Acute and chronic respiratory failure with hypoxia: Secondary | ICD-10-CM

## 2021-04-26 DIAGNOSIS — N179 Acute kidney failure, unspecified: Secondary | ICD-10-CM | POA: Diagnosis not present

## 2021-04-26 LAB — CBC WITH DIFFERENTIAL/PLATELET
Abs Immature Granulocytes: 0.04 10*3/uL (ref 0.00–0.07)
Basophils Absolute: 0 10*3/uL (ref 0.0–0.1)
Basophils Relative: 0 %
Eosinophils Absolute: 0.3 10*3/uL (ref 0.0–0.5)
Eosinophils Relative: 3 %
HCT: 31 % — ABNORMAL LOW (ref 39.0–52.0)
Hemoglobin: 10.1 g/dL — ABNORMAL LOW (ref 13.0–17.0)
Immature Granulocytes: 0 %
Lymphocytes Relative: 14 %
Lymphs Abs: 1.4 10*3/uL (ref 0.7–4.0)
MCH: 28.4 pg (ref 26.0–34.0)
MCHC: 32.6 g/dL (ref 30.0–36.0)
MCV: 87.1 fL (ref 80.0–100.0)
Monocytes Absolute: 1 10*3/uL (ref 0.1–1.0)
Monocytes Relative: 10 %
Neutro Abs: 7.6 10*3/uL (ref 1.7–7.7)
Neutrophils Relative %: 73 %
Platelets: 118 10*3/uL — ABNORMAL LOW (ref 150–400)
RBC: 3.56 MIL/uL — ABNORMAL LOW (ref 4.22–5.81)
RDW: 15.7 % — ABNORMAL HIGH (ref 11.5–15.5)
WBC: 10.4 10*3/uL (ref 4.0–10.5)
nRBC: 0 % (ref 0.0–0.2)

## 2021-04-26 LAB — COMPREHENSIVE METABOLIC PANEL
ALT: 21 U/L (ref 0–44)
AST: 15 U/L (ref 15–41)
Albumin: 1.4 g/dL — ABNORMAL LOW (ref 3.5–5.0)
Alkaline Phosphatase: 62 U/L (ref 38–126)
Anion gap: 11 (ref 5–15)
BUN: 88 mg/dL — ABNORMAL HIGH (ref 8–23)
CO2: 25 mmol/L (ref 22–32)
Calcium: 7.3 mg/dL — ABNORMAL LOW (ref 8.9–10.3)
Chloride: 103 mmol/L (ref 98–111)
Creatinine, Ser: 5.28 mg/dL — ABNORMAL HIGH (ref 0.61–1.24)
GFR, Estimated: 11 mL/min — ABNORMAL LOW (ref >60)
Glucose, Bld: 132 mg/dL — ABNORMAL HIGH (ref 70–99)
Potassium: 4.2 mmol/L (ref 3.5–5.1)
Sodium: 139 mmol/L (ref 135–145)
Total Bilirubin: 0.7 mg/dL (ref 0.3–1.2)
Total Protein: 5.7 g/dL — ABNORMAL LOW (ref 6.5–8.1)

## 2021-04-26 LAB — GLUCOSE, CAPILLARY
Glucose-Capillary: 136 mg/dL — ABNORMAL HIGH (ref 70–99)
Glucose-Capillary: 132 mg/dL — ABNORMAL HIGH (ref 70–99)
Glucose-Capillary: 139 mg/dL — ABNORMAL HIGH (ref 70–99)
Glucose-Capillary: 159 mg/dL — ABNORMAL HIGH (ref 70–99)
Glucose-Capillary: 129 mg/dL — ABNORMAL HIGH (ref 70–99)
Glucose-Capillary: 151 mg/dL — ABNORMAL HIGH (ref 70–99)

## 2021-04-26 LAB — CULTURE, RESPIRATORY W GRAM STAIN: Culture: NORMAL

## 2021-04-26 LAB — POTASSIUM
Potassium: 4.3 mmol/L (ref 3.5–5.1)
Potassium: 4.2 mmol/L (ref 3.5–5.1)

## 2021-04-26 LAB — APTT: aPTT: 35 seconds (ref 24–36)

## 2021-04-26 LAB — PROTIME-INR
INR: 1.3 — ABNORMAL HIGH (ref 0.8–1.2)
Prothrombin Time: 16.2 seconds — ABNORMAL HIGH (ref 11.4–15.2)

## 2021-04-26 LAB — MAGNESIUM
Magnesium: 2.5 mg/dL — ABNORMAL HIGH (ref 1.7–2.4)
Magnesium: 2.4 mg/dL (ref 1.7–2.4)

## 2021-04-26 LAB — PHOSPHORUS
Phosphorus: 9.3 mg/dL — ABNORMAL HIGH (ref 2.5–4.6)
Phosphorus: 9.8 mg/dL — ABNORMAL HIGH (ref 2.5–4.6)

## 2021-04-26 MED ORDER — OSMOLITE 1.5 CAL PO LIQD
1000.0000 mL | ORAL | Status: DC
  Administered 2021-04-26 – 2021-05-01 (×5): 1000 mL
  Filled 2021-04-26 (×6): qty 1000

## 2021-04-26 NOTE — Progress Notes (Signed)
During morning rounds, ventilator checked and suctioning performed.  Patient has frank blood secretions coming from ET tube at this time.  RT will continue to monitor during the shift.
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
## 2021-04-26 NOTE — Progress Notes (Signed)
[facility] Progress Note   Subjective:   UOP 61 yesterday, only 174m UF with HD which was only 2hrs due to filter clotting.  Hemodynamically stable. Remains on 60% FiO2.  Plans for trach soon.  Frank blood from ETT noted by RT this AM.   Objective Vitals:   04/26/21 0630 04/26/21 0700 04/26/21 0716 04/26/21 0800  BP: (!) 107/54 (!) 123/56  138/66  Pulse: 92 (!) 102    Resp: (!) 25 (!) 24  (!) 29  Temp:   98 F (36.7 C)   TempSrc:   Axillary   SpO2: 92% 94%    Weight:      Height:       Physical Exam General: intubated, sedated on fentanyl Heart: RRR Lungs: coarse ant Abdomen: soft, no apparent tenderness, mod distended still Extremities: 1+  diffuse edema Dialysis Access:  RIJ temp HD catheter  Additional Objective Labs: Basic Metabolic Panel: Recent Labs  Lab 04/23/21 0143 04/23/21 1354 04/24/21 0257 04/25/21 0040 04/25/21 1055 04/26/21 0529  NA 135 136 136 138 140 139  K 4.7 5.1 4.7 4.5 3.6 4.2  CL 99 99 100 101  --  103  CO2 _0 --  25  GLUCOSE 108* 93 79 88  --  132*  BUN 65* 78* 89* 101*  --  88*  CREATININE 2.81* 3.92* 4.51* 5.36*  --  5.28*  CALCIUM 7.6* 7.4* 7.4* 7.0*  --  7.3*  PHOS 4.3 6.4*  --  10.5*  --   --     Liver Function Tests: Recent Labs  Lab 05/10/2021 1657 04/23/21 1354 04/24/21 0257 04/25/21 0040 04/26/21 0529  AST 23  --  27  --  15  ALT 26  --  29  --  21  ALKPHOS 61  --  73  --  62  BILITOT 1.1  --  0.8  --  0.7  PROT 6.4*  --  6.4*  --  5.7*  ALBUMIN 1.4*   < > 1.5* 1.3* 1.4*   < > = values in this interval not displayed.    No results for input(s): LIPASE, AMYLASE in the last 168 hours. CBC: Recent Labs  Lab 04/20/2021 1657 04/23/21 0143 04/24/21 0257 04/25/21 0040 04/25/21 1055 04/26/21 0529  WBC 12.2* 11.8* 11.5* 10.6*  --  10.4  NEUTROABS  --   --  8.7*  --   --  7.6  HGB 11.8* 12.1* 11.9* 10.5* 12.2* 10.1*  HCT 36.0* 36.3* 36.0* 32.0* 36.0* 31.0*  MCV 86.1 85.0 86.1 85.8  --  87.1   PLT 156 167 148* 110*  --  118*    Blood Culture    Component Value Date/Time   SDES TRACHEAL ASPIRATE 04/23/2021 1531   SPECREQUEST NONE 04/23/2021 1531   CULT  04/23/2021 1531    FEW Consistent with normal respiratory flora. No Pseudomonas species isolated [facility]formed at [id number], [place] [address]   REPTSTATUS 04/26/2021 FINAL 04/23/2021 1531    Cardiac Enzymes: No results for input(s): CKTOTAL, CKMB, CKMBINDEX, TROPONINI in the last 168 hours. CBG: Recent Labs  Lab 04/25/21 1545 04/25/21 1930 04/25/21 2316 04/26/21 0344 04/26/21 0714  GLUCAP 107* 138* 137* 136* 132*    Iron Studies: No results for input(s): IRON, TIBC, TRANSFERRIN, FERRITIN in the last 72 hours. _1 @ Studies/Results: No results found. Medications:  feeding supplement (OSMOLITE 1.5 CAL) 1,000 mL (04/25/21 1151)   fentaNYL infusion INTRAVENOUS 200  mcg/hr (04/26/21 0800)    ALPRAZolam  0.25 mg Per Tube TID   chlorhexidine gluconate (MEDLINE KIT)  15 mL Mouth Rinse BID   Chlorhexidine Gluconate Cloth  6 each Topical Daily   docusate  100 mg Per Tube BID   doxazosin  2 mg Per Tube Daily   feeding supplement (PROSource TF)  45 mL Per Tube TID   fentaNYL (SUBLIMAZE) injection  25 mcg Intravenous Once   FLUoxetine  20 mg Per Tube Daily   heparin  5,000 Units Subcutaneous Q8H   insulin aspart  0-9 Units Subcutaneous Q4H   mouth rinse  15 mL Mouth Rinse 10 times per day   pantoprazole (PROTONIX) IV  40 mg Intravenous Q12H   polyethylene glycol  17 g Per Tube BID   senna  1 tablet Per Tube BID   sodium chloride flush  10-40 mL Intracatheter Q12H    Assessment/Plan **AKI: oliguric.  Multifactorial with respiratory failure secondary to aspiration, possibly sepsis, urinary retention noted in [facility] by RN.  Renal US - known solitary kidney with cysts noted < 10.  No hydronephrosis. UA from foley with blood, 1+ protein, + WBC, FeNa not consistent with  prerenal --Required RRT early AM 7/12 for refractory K, had HD 7/14, plan next 7/16 - will flush filter q10mn as I wish to avoid heparin given concern for bleeding  --unfortunately not making any urine now --Maintain foley for now --Avoid hypotension and nephrotoxins as able   **Hyperkalemia: Has been ok after HD, cont to trend   **Hyponatremia: resolved with HD/volume    **VDRF: vent support per PCCM; headed for trach soon.   **Abd distention with suspected ileus: NG of suction now with bloody aspirate 7/14.   **HFpEF: BB, relatively euvolemic currently.   **DM: per primary  Cont full scope of care for now per family.   LJannifer HickMD 04/26/2021, 9:37 AM  [facility] Pager: [phone]

## 2021-04-27 ENCOUNTER — Inpatient Hospital Stay (HOSPITAL_COMMUNITY): Payer: Medicare Other

## 2021-04-27 DIAGNOSIS — N179 Acute kidney failure, unspecified: Secondary | ICD-10-CM | POA: Diagnosis not present

## 2021-04-27 DIAGNOSIS — J9601 Acute respiratory failure with hypoxia: Secondary | ICD-10-CM

## 2021-04-27 LAB — BASIC METABOLIC PANEL
Anion gap: 13 (ref 5–15)
BUN: 100 mg/dL — ABNORMAL HIGH (ref 8–23)
CO2: 24 mmol/L (ref 22–32)
Calcium: 7.1 mg/dL — ABNORMAL LOW (ref 8.9–10.3)
Chloride: 102 mmol/L (ref 98–111)
Creatinine, Ser: 6 mg/dL — ABNORMAL HIGH (ref 0.61–1.24)
GFR, Estimated: 9 mL/min — ABNORMAL LOW (ref >60)
Glucose, Bld: 133 mg/dL — ABNORMAL HIGH (ref 70–99)
Potassium: 4.5 mmol/L (ref 3.5–5.1)
Sodium: 139 mmol/L (ref 135–145)

## 2021-04-27 LAB — PHOSPHORUS
Phosphorus: 10.1 mg/dL — ABNORMAL HIGH (ref 2.5–4.6)
Phosphorus: 10.4 mg/dL — ABNORMAL HIGH (ref 2.5–4.6)

## 2021-04-27 LAB — MAGNESIUM
Magnesium: 2.5 mg/dL — ABNORMAL HIGH (ref 1.7–2.4)
Magnesium: 2.4 mg/dL (ref 1.7–2.4)

## 2021-04-27 LAB — CBC
HCT: 30.2 % — ABNORMAL LOW (ref 39.0–52.0)
Hemoglobin: 9.7 g/dL — ABNORMAL LOW (ref 13.0–17.0)
MCH: 28.1 pg (ref 26.0–34.0)
MCHC: 32.1 g/dL (ref 30.0–36.0)
MCV: 87.5 fL (ref 80.0–100.0)
Platelets: 149 10*3/uL — ABNORMAL LOW (ref 150–400)
RBC: 3.45 MIL/uL — ABNORMAL LOW (ref 4.22–5.81)
RDW: 16 % — ABNORMAL HIGH (ref 11.5–15.5)
WBC: 10.7 10*3/uL — ABNORMAL HIGH (ref 4.0–10.5)
nRBC: 0 % (ref 0.0–0.2)

## 2021-04-27 LAB — POTASSIUM: Potassium: 5.2 mmol/L — ABNORMAL HIGH (ref 3.5–5.1)

## 2021-04-27 LAB — GLUCOSE, CAPILLARY
Glucose-Capillary: 141 mg/dL — ABNORMAL HIGH (ref 70–99)
Glucose-Capillary: 163 mg/dL — ABNORMAL HIGH (ref 70–99)
Glucose-Capillary: 152 mg/dL — ABNORMAL HIGH (ref 70–99)
Glucose-Capillary: 136 mg/dL — ABNORMAL HIGH (ref 70–99)
Glucose-Capillary: 187 mg/dL — ABNORMAL HIGH (ref 70–99)
Glucose-Capillary: 127 mg/dL — ABNORMAL HIGH (ref 70–99)

## 2021-04-27 MED ORDER — HEPARIN SODIUM (PORCINE) 1000 UNIT/ML IJ SOLN
INTRAMUSCULAR | Status: AC
  Filled 2021-04-27: qty 4

## 2021-04-27 MED ORDER — ALPRAZOLAM 0.25 MG PO TABS
0.2500 mg | ORAL_TABLET | Freq: Two times a day (BID) | ORAL | Status: DC
  Administered 2021-04-27 – 2021-05-01 (×9): 0.25 mg
  Filled 2021-04-27 (×9): qty 1

## 2021-04-27 MED ORDER — PANTOPRAZOLE SODIUM 40 MG IV SOLR
40.0000 mg | INTRAVENOUS | Status: DC
  Administered 2021-04-28 – 2021-05-01 (×4): 40 mg via INTRAVENOUS
  Filled 2021-04-27 (×4): qty 40

## 2021-04-27 NOTE — Progress Notes (Signed)
Pt had a event with desatting into the 41s. Rt bagged pt with PEEP valve, sats improved. MD arrived in room and RT assisted MD with bedside bronch. Pt back on vent. VS stable.  RT will continue to monitor.

## 2021-04-27 NOTE — CV Procedure (Signed)
Bronchoscopy Procedure Note  Date of Operation: 04/27/2021  Pre-op Diagnosis: hypoxemia secondary to mucous plugging  Post-op Diagnosis: same  Surgeon: Shellia Cleverly  Assistants: RT   Anesthesia: on fentanyl drip  Operation: Flexible fiberoptic bronchoscopy, diagnostic and diagnostic, patient on vent, performed at bedside  Findings: moderate to large amounts of thin mucous mostly in small airways, suctioned clear  Specimen: none  Estimated Blood Loss: Minimal  Drains: none  Complications: tolerated well  Indications and History: The patient is a 75 y.o. male with pneumonia and hypoxemia on vent.  The risks, benefits, complications, treatment options and expected outcomes were discussed with the patient.  The possibilities of reaction to medication, pulmonary aspiration, perforation of a viscus, bleeding, failure to diagnose a condition and creating a complication requiring transfusion or operation were discussed with the patient's son who freely consented with nurse as phone witness.    Description of Procedure: At bed side in ICU the patient was identified as Kristopher [name] and the procedure verified as Flexible Fiberoptic Bronchoscopy.  A Time Out was held and the above information confirmed.   The patient was positioned supine and the bronchoscope was passed through the ET tube . The vocal cords were visualized and    Careful inspection of the tracheal lumen was accomplished. The tracheal lumen was narrowed above the carina  The scope was sequentially passed into the left main and then left upper and lower bronchi and segmental bronchi. NS lavage was done bilaterally and there was no specimen.   The scope was then withdrawn and advanced into the right main bronchus and then into the RUL, RML, and RLL bronchi and segmental bronchi. NS lavage was done and there was no specimen.   Endobronchial findings: distal mucous Trachea: Collapse with mucus plugging Carina: Normal  mucosa Right main bronchus: nl Right upper lobe bronchus: distal mucous Right upper lobe bronchus: distal mucous Right upper lobe bronchus: distal mucous Left main bronchus: distal mucous Left upper lobe bronchus: distal mucous Left lower lobe bronchus: distal mucous  The Patient was taken to the Endoscopy Recovery area in satisfactory condition.  Attestation: I performed the procedure.  Shellia Cleverly
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
## 2021-04-27 NOTE — Progress Notes (Signed)
[facility] Progress Note   Subjective:   UOP 21 yesterday.  Hemodynamically stable. NG tube with TF now. Nos up to 70% FiO2; CXR this AM unchanged.  Plans for trach soon.  On for HD today.  Objective Vitals:   04/27/21 0727 04/27/21 0753 04/27/21 0800 04/27/21 0900  BP: 128/63  124/67 (!) 117/56  Pulse: (!) 118  94 79  Resp: (!) 30  (!) 23 (!) 25  Temp:  97.7 F (36.5 C)    TempSrc:  Oral    SpO2: 92%  93% (!) 89%  Weight:      Height:       Physical Exam General: intubated, sedated on fentanyl but is awake Heart: RRR Lungs: coarse ant Abdomen: soft, no apparent tenderness, mod distended still Extremities: 1+  diffuse edema Dialysis Access:  RIJ temp HD catheter  Additional Objective Labs: Basic Metabolic Panel: Recent Labs  Lab 04/25/21 0040 04/25/21 1055 04/26/21 0529 04/26/21 1118 04/26/21 1805 04/27/21 0350  NA 138 140 139  --   --  139  K 4.5 3.6 4.2 4.3 4.2 4.5  CL 101  --  103  --   --  102  CO2 24  --  25  --   --  24  GLUCOSE 88  --  132*  --   --  133*  BUN 101*  --  88*  --   --  100*  CREATININE 5.36*  --  5.28*  --   --  6.00*  CALCIUM 7.0*  --  7.3*  --   --  7.1*  PHOS 10.5*  --   --  9.3* 9.8* 10.1*    Liver Function Tests: Recent Labs  Lab 04/15/2021 1657 04/23/21 1354 04/24/21 0257 04/25/21 0040 04/26/21 0529  AST 23  --  27  --  15  ALT 26  --  29  --  21  ALKPHOS 61  --  73  --  62  BILITOT 1.1  --  0.8  --  0.7  PROT 6.4*  --  6.4*  --  5.7*  ALBUMIN 1.4*   < > 1.5* 1.3* 1.4*   < > = values in this interval not displayed.    No results for input(s): LIPASE, AMYLASE in the last 168 hours. CBC: Recent Labs  Lab 04/23/21 0143 04/24/21 0257 04/25/21 0040 04/25/21 1055 04/26/21 0529 04/27/21 0350  WBC 11.8* 11.5* 10.6*  --  10.4 10.7*  NEUTROABS  --  8.7*  --   --  7.6  --   HGB 12.1* 11.9* 10.5* 12.2* 10.1* 9.7*  HCT 36.3* 36.0* 32.0* 36.0* 31.0* 30.2*  MCV 85.0 86.1 85.8  --  87.1 87.5  PLT 167 148* 110*   --  118* 149*    Blood Culture    Component Value Date/Time   SDES TRACHEAL ASPIRATE 04/23/2021 1531   SPECREQUEST NONE 04/23/2021 1531   CULT  04/23/2021 1531    FEW Consistent with normal respiratory flora. No Pseudomonas species isolated Performed at [facility] [address] [place], [place] [address]    REPTSTATUS 04/26/2021 FINAL 04/23/2021 1531    Cardiac Enzymes: No results for input(s): CKTOTAL, CKMB, CKMBINDEX, TROPONINI in the last 168 hours. CBG: Recent Labs  Lab 04/26/21 1517 04/26/21 1918 04/26/21 2320 04/27/21 0352 04/27/21 0750  GLUCAP 159* 129* 151* 141* 163*    Iron Studies: No results for input(s): IRON, TIBC, TRANSFERRIN, FERRITIN in the last 72 hours. _0 @ Studies/Results:  DG Chest Port 1 View  Result Date: 04/26/2021 CLINICAL DATA:  Acute on chronic respiratory failure. EXAM: PORTABLE CHEST 1 VIEW COMPARISON:  Single-view of the chest 04/23/2021. FINDINGS: Support tubes and lines are unchanged. Bilateral airspace disease versus. There has been some worsening aeration left mid and right lower lung zones. Heart size is normal. Aortic atherosclerosis. No pneumothorax or fluid. IMPRESSION: Support tubes and lines are unchanged and project in good position. Mild worsening of airspace disease in the right lower and left mid lung zones. Electronically Signed   By: Inge Rise M.D.   On: 04/26/2021 13:37   DG Chest Port 1 View  Result Date: 04/26/2021 CLINICAL DATA:  respiratory failure and hypoxia. EXAM: PORTABLE CHEST 1 VIEW COMPARISON:  Earlier today FINDINGS: Right sided central venous catheter with tip in the projection of the SVC is unchanged. ET tube tip is above the carina. There is a nasogastric tube and feeding tube, both with tips below the level of the GE junction. Stable cardiomediastinal contours. Diffuse bilateral interstitial and airspace opacities are unchanged from previous exam. No pneumothorax identified. IMPRESSION: 1.  No change in aeration to the lungs compared with previous exam. 2. Stable support apparatus. Electronically Signed   By: Kerby Moors M.D.   On: 04/26/2021 13:11   Medications:  feeding supplement (OSMOLITE 1.5 CAL) 1,000 mL (04/26/21 1315)   fentaNYL infusion INTRAVENOUS 200 mcg/hr (04/27/21 0900)    ALPRAZolam  0.25 mg Per Tube TID   chlorhexidine gluconate (MEDLINE KIT)  15 mL Mouth Rinse BID   Chlorhexidine Gluconate Cloth  6 each Topical Daily   docusate  100 mg Per Tube BID   doxazosin  2 mg Per Tube Daily   feeding supplement (PROSource TF)  45 mL Per Tube TID   fentaNYL (SUBLIMAZE) injection  25 mcg Intravenous Once   FLUoxetine  20 mg Per Tube Daily   heparin  5,000 Units Subcutaneous Q8H   insulin aspart  0-9 Units Subcutaneous Q4H   mouth rinse  15 mL Mouth Rinse 10 times per day   pantoprazole (PROTONIX) IV  40 mg Intravenous Q12H   polyethylene glycol  17 g Per Tube BID   senna  1 tablet Per Tube BID   sodium chloride flush  10-40 mL Intracatheter Q12H    Assessment/Plan **AKI: oliguric.  Multifactorial with respiratory failure secondary to aspiration, possibly sepsis, urinary retention noted in [facility] by RN.  Renal US - known solitary kidney with cysts noted < 10.  No hydronephrosis. UA from foley with blood, 1+ protein, + WBC, FeNa not consistent with prerenal --Required RRT early AM 7/12 for refractory K, had HD 7/14 but only 2hr due to clotting cicuit, HD today - will flush filter q3mn as I wish to avoid heparin given concern for bleeding; UF 2-3L as tol --unfortunately not making any urine now; long road for recovery but plan to continue current care for now --Avoid hypotension and nephrotoxins as able   **VDRF: vent support per PCCM; headed for trach soon.   **Abd distention with suspected ileus: NG of suction now with bloody aspirate 7/14.   **HFpEF: BB, sl hypervolemic currently but without pulmonary edema on CXR.  UF 2-3L today with HD.   **DM: per  primary  Cont full scope of care for now per family.   LJannifer HickMD 04/27/2021, 9:44 AM  [facility] Pager: [phone]

## 2021-04-27 NOTE — Progress Notes (Signed)
[facility] Progress Note Patient Name: Kristopher [name] DOB: [birth year] MRN: [id number]   Date of Service  04/27/2021  HPI/Events of Note  Patient with acute desaturation, taken off ventilator and bagged, easy to bag per RT, copious secretions aspirated just prior to my arrival in the room, breath sounds diminished on the left per bedside RN.  eICU Interventions  Stat portable CXR ordered, PEEP valve setting dialed up to 18, saturation responded by increasing from 70's to 89-90 %, which is close to his pre-desaturation baseline, PCCM Ground Crew arrived in the room to take over care.        Kerry Kass Chelbie Jarnagin 04/27/2021, 8:23 PM

## 2021-04-28 DIAGNOSIS — N179 Acute kidney failure, unspecified: Secondary | ICD-10-CM | POA: Diagnosis not present

## 2021-04-28 DIAGNOSIS — J9601 Acute respiratory failure with hypoxia: Secondary | ICD-10-CM | POA: Diagnosis not present

## 2021-04-28 LAB — POTASSIUM
Potassium: 5.4 mmol/L — ABNORMAL HIGH (ref 3.5–5.1)
Potassium: 6 mmol/L — ABNORMAL HIGH (ref 3.5–5.1)

## 2021-04-28 LAB — GLUCOSE, CAPILLARY
Glucose-Capillary: 118 mg/dL — ABNORMAL HIGH (ref 70–99)
Glucose-Capillary: 129 mg/dL — ABNORMAL HIGH (ref 70–99)
Glucose-Capillary: 145 mg/dL — ABNORMAL HIGH (ref 70–99)
Glucose-Capillary: 152 mg/dL — ABNORMAL HIGH (ref 70–99)
Glucose-Capillary: 157 mg/dL — ABNORMAL HIGH (ref 70–99)
Glucose-Capillary: 124 mg/dL — ABNORMAL HIGH (ref 70–99)

## 2021-04-28 LAB — BLOOD GAS, ARTERIAL
Acid-base deficit: 3.1 mmol/L — ABNORMAL HIGH (ref 0.0–2.0)
Bicarbonate: 24 mmol/L (ref 20.0–28.0)
Drawn by: 164
FIO2: 90
O2 Saturation: 89.5 %
Patient temperature: 37
pCO2 arterial: 63.9 mmHg — ABNORMAL HIGH (ref 32.0–48.0)
pH, Arterial: 7.199 — CL (ref 7.350–7.450)
pO2, Arterial: 68.3 mmHg — ABNORMAL LOW (ref 83.0–108.0)

## 2021-04-28 LAB — HEPARIN LEVEL (UNFRACTIONATED): Heparin Unfractionated: 0.23 IU/mL — ABNORMAL LOW (ref 0.30–0.70)

## 2021-04-28 LAB — PHOSPHORUS: Phosphorus: 10.1 mg/dL — ABNORMAL HIGH (ref 2.5–4.6)

## 2021-04-28 LAB — MAGNESIUM: Magnesium: 2.5 mg/dL — ABNORMAL HIGH (ref 1.7–2.4)

## 2021-04-28 MED ORDER — HEPARIN BOLUS VIA INFUSION
2000.0000 [IU] | Freq: Once | INTRAVENOUS | Status: AC
  Administered 2021-04-28: 2000 [IU] via INTRAVENOUS
  Filled 2021-04-28: qty 2000

## 2021-04-28 MED ORDER — PRISMASOL BGK 0/2.5 32-2.5 MEQ/L EC SOLN
Status: DC
  Filled 2021-04-28 (×5): qty 5000

## 2021-04-28 MED ORDER — SODIUM CHLORIDE 0.9 % FOR CRRT: INTRAVENOUS_CENTRAL | Status: DC | PRN

## 2021-04-28 MED ORDER — HEPARIN SODIUM (PORCINE) 1000 UNIT/ML DIALYSIS: 1000.0000 [IU] | INTRAMUSCULAR | Status: DC | PRN

## 2021-04-28 MED ORDER — ALTEPLASE 2 MG IJ SOLR
2.0000 mg | Freq: Once | INTRAMUSCULAR | Status: AC | PRN
  Administered 2021-05-02: 2 mg
  Filled 2021-04-28: qty 2

## 2021-04-28 MED ORDER — PRISMASOL BGK 0/2.5 32-2.5 MEQ/L EC SOLN
Status: DC
  Filled 2021-04-28 (×9): qty 5000

## 2021-04-28 MED ORDER — HEPARIN (PORCINE) 25000 UT/250ML-% IV SOLN
1550.0000 [IU]/h | INTRAVENOUS | Status: DC
  Administered 2021-04-28: 1250 [IU]/h via INTRAVENOUS
  Administered 2021-04-29: 1400 [IU]/h via INTRAVENOUS
  Administered 2021-04-30: 1550 [IU]/h via INTRAVENOUS
  Filled 2021-04-28 (×3): qty 250

## 2021-04-28 MED ORDER — HEPARIN SODIUM (PORCINE) 1000 UNIT/ML IJ SOLN
INTRAMUSCULAR | Status: AC
  Filled 2021-04-28: qty 1

## 2021-04-28 MED ORDER — PRISMASOL BGK 4/2.5 32-4-2.5 MEQ/L EC SOLN: Status: DC

## 2021-04-28 MED ORDER — PRISMASOL BGK 0/2.5 32-2.5 MEQ/L EC SOLN
Status: DC
  Filled 2021-04-28 (×18): qty 5000

## 2021-04-28 MED ORDER — SODIUM ZIRCONIUM CYCLOSILICATE 10 G PO PACK
10.0000 g | PACK | Freq: Once | ORAL | Status: AC
  Administered 2021-04-28: 10 g
  Filled 2021-04-28: qty 1

## 2021-04-28 NOTE — Progress Notes (Signed)
Pt hemodialysis initiated 15 mins and  Pt BP drop to 80/43. O2 sat 80%.. Pt BP recovered after blood returned. Treatment terminated and Dr. Jonnie Finner notified. Pt needs to do CRRT per Dr. Johnney Ou phone ordered.

## 2021-04-28 NOTE — Progress Notes (Signed)
Pt had another untoward response to attempted HD w/ acute hypotension and desatting. HD was again aborted. Will plan on trial of CRRT.  See orders.   Kelly Splinter, MD 04/28/2021, 8:28 PM

## 2021-04-28 NOTE — Progress Notes (Signed)
ANTICOAGULATION CONSULT NOTE - Initial Consult  Pharmacy Consult for heparin Indication:  possible PE  No Known Allergies  Patient Measurements: Height: '5\' 9"'$  (175.3 cm) Weight: 78.5 kg (173 lb 1 oz) IBW/kg (Calculated) : 70.7 Heparin Dosing Weight: 78.5 kg  Vital Signs: Temp: 97.6 F (36.4 C) (07/17 1136) Temp Source: Axillary (07/17 1136) BP: 137/50 (07/17 1209) Pulse Rate: 107 (07/17 1209)  Labs: Recent Labs    04/26/21 0529 04/27/21 0350  HGB 10.1* 9.7*  HCT 31.0* 30.2*  PLT 118* 149*  APTT 35  --   LABPROT 16.2*  --   INR 1.3*  --   CREATININE 5.28* 6.00*    Estimated Creatinine Clearance: 10.6 mL/min (A) (by C-G formula based on SCr of 6 mg/dL (H)).   Medical History: Past Medical History:  Diagnosis Date   Anxiety    takes Xanax prn   Cardiomyopathy    takes Digoxin daily   Depression    takes Prozac daily   Dyslipidemia    takes Simvastatin daily   HTN (hypertension)    takes Carvedilol and Lisinopril daily   Impaired hearing    left    Assessment: 75 yo male presented with acute respiratory failure requiring intubation 6/26 with slow progress. Required bronchoscopy today due to hypotension and desaturation when starting HD (tolerated ~15 min). Pharmacy consulted to start heparin for possible PE.   On prophylactic heparin at SNF. Plan for half-dose heparin bolus in the setting of prophylactic heparin during admission. Hb 9.7 (BL ~14) and platelets 149, no s/sx of bleeding.  Goal of Therapy:  Heparin level 0.3-0.7 units/ml Monitor platelets by anticoagulation protocol: Yes   Plan:  Give 2000 units bolus x 1 Start heparin infusion at 1250 units/hr 8-hour heparin level @ 2230 Daily heparin level and CBC  Susette Racer Katie Faraone 04/28/2021,1:13 PM

## 2021-04-28 NOTE — Progress Notes (Signed)
Kristopher [name] for heparin Indication:  possible PE  No Known Allergies  Patient Measurements: Height: '5\' 9"'$  (175.3 cm) Weight: 79.9 kg (176 lb 2.4 oz) IBW/kg (Calculated) : 70.7 Heparin Dosing Weight: 78.5 kg  Vital Signs: Temp: 97.6 F (36.4 C) (07/17 1912) Temp Source: Axillary (07/17 1912) BP: 122/49 (07/17 2230) Pulse Rate: 92 (07/17 2230)  Labs: Recent Labs    04/26/21 0529 04/27/21 0350 04/28/21 2131  HGB 10.1* 9.7*  --   HCT 31.0* 30.2*  --   PLT 118* 149*  --   APTT 35  --   --   LABPROT 16.2*  --   --   INR 1.3*  --   --   HEPARINUNFRC  --   --  0.23*  CREATININE 5.28* 6.00*  --      Estimated Creatinine Clearance: 10.6 mL/min (A) (by C-G formula based on SCr of 6 mg/dL (H)).   Medical History: Past Medical History:  Diagnosis Date   Anxiety    takes Xanax prn   Cardiomyopathy    takes Digoxin daily   Depression    takes Prozac daily   Dyslipidemia    takes Simvastatin daily   HTN (hypertension)    takes Carvedilol and Lisinopril daily   Impaired hearing    left    Assessment: 75 yo male presented with acute respiratory failure requiring intubation 6/26 with slow progress. Required bronchoscopy today due to hypotension and desaturation when starting HD (tolerated ~15 min). Pharmacy consulted to start heparin for possible PE.   On prophylactic heparin at SNF. Plan for half-dose heparin bolus in the setting of prophylactic heparin during admission. Heparin level came back subtherapeutic at 0.23, on 1250 units/hr. No s/sx of bleeding or infusion issues.   Goal of Therapy:  Heparin level 0.3-0.7 units/ml Monitor platelets by anticoagulation protocol: Yes   Plan:  Increase heparin infusion to 1400 units/hr Order heparin level in 8 hours Daily heparin level and CBC  Antonietta Jewel, PharmD, [place] Pharmacist  Phone: [phone] 04/28/2021 10:44 PM  Please check AMION for all [place] phone  numbers After 10:00 PM, call [place] [phone]

## 2021-04-28 NOTE — Progress Notes (Signed)
[facility] Progress Note   Subjective:   UOP 0 yesterday.  Hemodynamically stable until 15 min into HD SBP to 50, significant desaturation.  Bronch unrevealing.  Now up to 90% FiO2; CXR with ^ infiltrates - ?edema.    Objective Vitals:   04/28/21 0800 04/28/21 0900 04/28/21 1136 04/28/21 1209  BP: (!) 126/57 (!) 121/51  (!) 137/50  Pulse: 97 96  (!) 107  Resp: (!) 25 13  (!) 28  Temp:   97.6 F (36.4 C)   TempSrc:   Axillary   SpO2: 92% 91%  (!) 87%  Weight:      Height:       Physical Exam General: intubated, sedated on fentanyl but is awake Heart: RRR Lungs: coarse ant Abdomen: soft, no apparent tenderness, mod distended still Extremities: 1-2+  diffuse edema Dialysis Access:  RIJ temp HD catheter  Additional Objective Labs: Basic Metabolic Panel: Recent Labs  Lab 04/25/21 0040 04/25/21 1055 04/26/21 0529 04/26/21 1118 04/27/21 0350 04/27/21 1700 04/28/21 0501  NA 138 140 139  --  139  --   --   K 4.5 3.6 4.2   < > 4.5 5.2* 5.4*  CL 101  --  103  --  102  --   --   CO2 24  --  25  --  24  --   --   GLUCOSE 88  --  132*  --  133*  --   --   BUN 101*  --  88*  --  100*  --   --   CREATININE 5.36*  --  5.28*  --  6.00*  --   --   CALCIUM 7.0*  --  7.3*  --  7.1*  --   --   PHOS 10.5*  --   --    < > 10.1* 10.4* 10.1*   < > = values in this interval not displayed.    Liver Function Tests: Recent Labs  Lab 04/21/2021 1657 04/23/21 1354 04/24/21 0257 04/25/21 0040 04/26/21 0529  AST 23  --  27  --  15  ALT 26  --  29  --  21  ALKPHOS 61  --  73  --  62  BILITOT 1.1  --  0.8  --  0.7  PROT 6.4*  --  6.4*  --  5.7*  ALBUMIN 1.4*   < > 1.5* 1.3* 1.4*   < > = values in this interval not displayed.    No results for input(s): LIPASE, AMYLASE in the last 168 hours. CBC: Recent Labs  Lab 04/23/21 0143 04/24/21 0257 04/25/21 0040 04/25/21 1055 04/26/21 0529 04/27/21 0350  WBC 11.8* 11.5* 10.6*  --  10.4 10.7*  NEUTROABS  --  8.7*  --    --  7.6  --   HGB 12.1* 11.9* 10.5* 12.2* 10.1* 9.7*  HCT 36.3* 36.0* 32.0* 36.0* 31.0* 30.2*  MCV 85.0 86.1 85.8  --  87.1 87.5  PLT 167 148* 110*  --  118* 149*    Blood Culture    Component Value Date/Time   SDES TRACHEAL ASPIRATE 04/23/2021 1531   SPECREQUEST NONE 04/23/2021 1531   CULT  04/23/2021 1531    FEW Consistent with normal respiratory flora. No Pseudomonas species isolated Performed at [facility] [address] [place], [place] [address]    REPTSTATUS 04/26/2021 FINAL 04/23/2021 1531    Cardiac Enzymes: No results for input(s): CKTOTAL, CKMB, CKMBINDEX, TROPONINI in  the last 168 hours. CBG: Recent Labs  Lab 04/27/21 1909 04/27/21 2306 04/28/21 0308 04/28/21 0719 04/28/21 1135  GLUCAP 187* 127* 118* 129* 145*    Iron Studies: No results for input(s): IRON, TIBC, TRANSFERRIN, FERRITIN in the last 72 hours. _0 @ Studies/Results: DG Chest Port 1 View  Result Date: 04/27/2021 CLINICAL DATA:  Acute respiratory failure EXAM: PORTABLE CHEST 1 VIEW COMPARISON:  04/26/2021, 04/23/2021 FINDINGS: Endotracheal tube tip about 2.9 cm superior to carina. 2 esophageal tubes are present, tips below the diaphragm but incompletely visualized. Right IJ central venous catheter tip over the SVC origin. Slightly improved aeration since yesterday's radiograph. Residual interstitial opacity and probable small effusions. Stable cardiomediastinal silhouette with aortic atherosclerosis. No pneumothorax. IMPRESSION: 1. Support lines and tubes as above. 2. Slight decreased pulmonary infiltrates or edema since yesterday's radiograph. Suspect that there are small pleural effusions. Electronically Signed   By: Donavan Foil M.D.   On: 04/27/2021 22:49   DG Chest Port 1 View  Result Date: 04/26/2021 CLINICAL DATA:  respiratory failure and hypoxia. EXAM: PORTABLE CHEST 1 VIEW COMPARISON:  Earlier today FINDINGS: Right sided central venous catheter with tip in the projection of  the SVC is unchanged. ET tube tip is above the carina. There is a nasogastric tube and feeding tube, both with tips below the level of the GE junction. Stable cardiomediastinal contours. Diffuse bilateral interstitial and airspace opacities are unchanged from previous exam. No pneumothorax identified. IMPRESSION: 1. No change in aeration to the lungs compared with previous exam. 2. Stable support apparatus. Electronically Signed   By: Kerby Moors M.D.   On: 04/26/2021 13:11   Medications:  feeding supplement (OSMOLITE 1.5 CAL) 1,000 mL (04/26/21 1315)   fentaNYL infusion INTRAVENOUS 100 mcg/hr (04/28/21 0800)    ALPRAZolam  0.25 mg Per Tube BID   chlorhexidine gluconate (MEDLINE KIT)  15 mL Mouth Rinse BID   Chlorhexidine Gluconate Cloth  6 each Topical Daily   docusate  100 mg Per Tube BID   doxazosin  2 mg Per Tube Daily   feeding supplement (PROSource TF)  45 mL Per Tube TID   fentaNYL (SUBLIMAZE) injection  25 mcg Intravenous Once   FLUoxetine  20 mg Per Tube Daily   insulin aspart  0-9 Units Subcutaneous Q4H   mouth rinse  15 mL Mouth Rinse 10 times per day   pantoprazole (PROTONIX) IV  40 mg Intravenous Q24H   polyethylene glycol  17 g Per Tube BID   senna  1 tablet Per Tube BID   sodium chloride flush  10-40 mL Intracatheter Q12H    Assessment/Plan **AKI: oliguric.  Multifactorial with respiratory failure secondary to aspiration, possibly sepsis, urinary retention noted in [facility] by RN.  Renal US - known solitary kidney with cysts noted < 10.  No hydronephrosis. UA from foley with blood, 1+ protein, + WBC, FeNa not consistent with prerenal --Required RRT early AM 7/12 for refractory K, had HD 7/14 but only 2hr due to clotting cicuit, HD overnight complicated by marked hypotension 23mn into tx requiring d/c of treatment --Concern for dialyzer reaction for the episode with HD - given worsening pulmonary status with possible edema will reattempt HD today with an alt dialyzer if it's  available or will provide generous flush of filter prior to treatment if no alt dialyzer available --If unable to tolerate iHD would need to switch to CRRT --unfortunately not making any urine now; long road for recovery but plan to continue current care for now --Avoid  hypotension and nephrotoxins as able   **VDRF: vent support per PCCM; headed for trach soon.  Desaturated with episode at start of HD = bronch unrevealing, CXR with worse infiltrates.  PCCM considering CTA -- if no improvement with volume off today I think it's very reasonable despite the risk of further AKI given his current pulmonary status is not compatible with longevity.    **HFpEF: BB,  hypervolemic currently with pulmonary edema on CXR.  UF  plan per above.  **DM: per primary  Cont full scope of care for now per family.   Jannifer Hick MD 04/28/2021, 12:32 PM  [facility] Pager: [phone]
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
## 2021-04-29 ENCOUNTER — Inpatient Hospital Stay (HOSPITAL_COMMUNITY): Payer: Medicare Other

## 2021-04-29 DIAGNOSIS — N179 Acute kidney failure, unspecified: Secondary | ICD-10-CM | POA: Diagnosis not present

## 2021-04-29 DIAGNOSIS — Z452 Encounter for adjustment and management of vascular access device: Secondary | ICD-10-CM | POA: Diagnosis not present

## 2021-04-29 DIAGNOSIS — J9601 Acute respiratory failure with hypoxia: Secondary | ICD-10-CM | POA: Diagnosis not present

## 2021-04-29 LAB — POCT I-STAT 7, (LYTES, BLD GAS, ICA,H+H)
Acid-Base Excess: 0 mmol/L (ref 0.0–2.0)
Acid-Base Excess: 0 mmol/L (ref 0.0–2.0)
Acid-Base Excess: 0 mmol/L (ref 0.0–2.0)
Acid-base deficit: 2 mmol/L (ref 0.0–2.0)
Bicarbonate: 27.4 mmol/L (ref 20.0–28.0)
Bicarbonate: 28.8 mmol/L — ABNORMAL HIGH (ref 20.0–28.0)
Bicarbonate: 29 mmol/L — ABNORMAL HIGH (ref 20.0–28.0)
Bicarbonate: 29.4 mmol/L — ABNORMAL HIGH (ref 20.0–28.0)
Calcium, Ion: 1.17 mmol/L (ref 1.15–1.40)
Calcium, Ion: 1.06 mmol/L — ABNORMAL LOW (ref 1.15–1.40)
Calcium, Ion: 1.04 mmol/L — ABNORMAL LOW (ref 1.15–1.40)
Calcium, Ion: 1.1 mmol/L — ABNORMAL LOW (ref 1.15–1.40)
HCT: 29 % — ABNORMAL LOW (ref 39.0–52.0)
HCT: 28 % — ABNORMAL LOW (ref 39.0–52.0)
HCT: 48 % (ref 39.0–52.0)
HCT: 33 % — ABNORMAL LOW (ref 39.0–52.0)
Hemoglobin: 9.9 g/dL — ABNORMAL LOW (ref 13.0–17.0)
Hemoglobin: 9.5 g/dL — ABNORMAL LOW (ref 13.0–17.0)
Hemoglobin: 16.3 g/dL (ref 13.0–17.0)
Hemoglobin: 11.2 g/dL — ABNORMAL LOW (ref 13.0–17.0)
O2 Saturation: 93 %
O2 Saturation: 86 %
O2 Saturation: 94 %
O2 Saturation: 87 %
Patient temperature: 97.5
Patient temperature: 97.5
Patient temperature: 97.7
Patient temperature: 97.6
Potassium: 7.4 mmol/L (ref 3.5–5.1)
Potassium: 4.9 mmol/L (ref 3.5–5.1)
Potassium: 4.9 mmol/L (ref 3.5–5.1)
Potassium: 4.5 mmol/L (ref 3.5–5.1)
Sodium: 139 mmol/L (ref 135–145)
Sodium: 141 mmol/L (ref 135–145)
Sodium: 139 mmol/L (ref 135–145)
Sodium: 138 mmol/L (ref 135–145)
TCO2: 30 mmol/L (ref 22–32)
TCO2: 31 mmol/L (ref 22–32)
TCO2: 31 mmol/L (ref 22–32)
TCO2: 32 mmol/L (ref 22–32)
pCO2 arterial: 75.5 mmHg (ref 32.0–48.0)
pCO2 arterial: 67.6 mmHg (ref 32.0–48.0)
pCO2 arterial: 64.3 mmHg — ABNORMAL HIGH (ref 32.0–48.0)
pCO2 arterial: 69.6 mmHg (ref 32.0–48.0)
pH, Arterial: 7.165 — CL (ref 7.350–7.450)
pH, Arterial: 7.234 — ABNORMAL LOW (ref 7.350–7.450)
pH, Arterial: 7.26 — ABNORMAL LOW (ref 7.350–7.450)
pH, Arterial: 7.23 — ABNORMAL LOW (ref 7.350–7.450)
pO2, Arterial: 83 mmHg (ref 83.0–108.0)
pO2, Arterial: 60 mmHg — ABNORMAL LOW (ref 83.0–108.0)
pO2, Arterial: 81 mmHg — ABNORMAL LOW (ref 83.0–108.0)
pO2, Arterial: 62 mmHg — ABNORMAL LOW (ref 83.0–108.0)

## 2021-04-29 LAB — CBC
HCT: 27.3 % — ABNORMAL LOW (ref 39.0–52.0)
Hemoglobin: 8.8 g/dL — ABNORMAL LOW (ref 13.0–17.0)
MCH: 28.7 pg (ref 26.0–34.0)
MCHC: 32.2 g/dL (ref 30.0–36.0)
MCV: 88.9 fL (ref 80.0–100.0)
Platelets: 172 10*3/uL (ref 150–400)
RBC: 3.07 MIL/uL — ABNORMAL LOW (ref 4.22–5.81)
RDW: 16.8 % — ABNORMAL HIGH (ref 11.5–15.5)
WBC: 11.3 10*3/uL — ABNORMAL HIGH (ref 4.0–10.5)
nRBC: 0.3 % — ABNORMAL HIGH (ref 0.0–0.2)

## 2021-04-29 LAB — RENAL FUNCTION PANEL
Albumin: 1.3 g/dL — ABNORMAL LOW (ref 3.5–5.0)
Albumin: 1.4 g/dL — ABNORMAL LOW (ref 3.5–5.0)
Anion gap: 7 (ref 5–15)
Anion gap: 8 (ref 5–15)
BUN: 89 mg/dL — ABNORMAL HIGH (ref 8–23)
BUN: 67 mg/dL — ABNORMAL HIGH (ref 8–23)
CO2: 30 mmol/L (ref 22–32)
CO2: 26 mmol/L (ref 22–32)
Calcium: 7 mg/dL — ABNORMAL LOW (ref 8.9–10.3)
Calcium: 7.2 mg/dL — ABNORMAL LOW (ref 8.9–10.3)
Chloride: 102 mmol/L (ref 98–111)
Chloride: 102 mmol/L (ref 98–111)
Creatinine, Ser: 4.92 mg/dL — ABNORMAL HIGH (ref 0.61–1.24)
Creatinine, Ser: 3.61 mg/dL — ABNORMAL HIGH (ref 0.61–1.24)
GFR, Estimated: 12 mL/min — ABNORMAL LOW (ref >60)
GFR, Estimated: 17 mL/min — ABNORMAL LOW (ref >60)
Glucose, Bld: 116 mg/dL — ABNORMAL HIGH (ref 70–99)
Glucose, Bld: 146 mg/dL — ABNORMAL HIGH (ref 70–99)
Phosphorus: 7.5 mg/dL — ABNORMAL HIGH (ref 2.5–4.6)
Phosphorus: 5.3 mg/dL — ABNORMAL HIGH (ref 2.5–4.6)
Potassium: 5 mmol/L (ref 3.5–5.1)
Potassium: 4.4 mmol/L (ref 3.5–5.1)
Sodium: 139 mmol/L (ref 135–145)
Sodium: 136 mmol/L (ref 135–145)

## 2021-04-29 LAB — GLUCOSE, CAPILLARY
Glucose-Capillary: 118 mg/dL — ABNORMAL HIGH (ref 70–99)
Glucose-Capillary: 118 mg/dL — ABNORMAL HIGH (ref 70–99)
Glucose-Capillary: 101 mg/dL — ABNORMAL HIGH (ref 70–99)
Glucose-Capillary: 138 mg/dL — ABNORMAL HIGH (ref 70–99)
Glucose-Capillary: 125 mg/dL — ABNORMAL HIGH (ref 70–99)
Glucose-Capillary: 138 mg/dL — ABNORMAL HIGH (ref 70–99)

## 2021-04-29 LAB — HEPARIN LEVEL (UNFRACTIONATED)
Heparin Unfractionated: 0.27 IU/mL — ABNORMAL LOW (ref 0.30–0.70)
Heparin Unfractionated: 0.41 IU/mL (ref 0.30–0.70)

## 2021-04-29 LAB — APTT: aPTT: 136 seconds — ABNORMAL HIGH (ref 24–36)

## 2021-04-29 LAB — MAGNESIUM: Magnesium: 2.3 mg/dL (ref 1.7–2.4)

## 2021-04-29 MED ORDER — MIDAZOLAM HCL 2 MG/2ML IJ SOLN
2.0000 mg | INTRAMUSCULAR | Status: DC | PRN
  Administered 2021-04-29 – 2021-05-01 (×8): 2 mg via INTRAVENOUS
  Filled 2021-04-29: qty 2

## 2021-04-29 MED ORDER — NOREPINEPHRINE 4 MG/250ML-% IV SOLN
0.0000 ug/min | INTRAVENOUS | Status: DC
  Administered 2021-04-29: 5 ug/min via INTRAVENOUS
  Filled 2021-04-29: qty 250

## 2021-04-29 MED ORDER — SODIUM BICARBONATE 8.4 % IV SOLN
INTRAVENOUS | Status: AC
  Filled 2021-04-29: qty 100

## 2021-04-29 MED ORDER — VANCOMYCIN HCL 1750 MG/350ML IV SOLN
1750.0000 mg | INTRAVENOUS | Status: AC
  Administered 2021-04-29: 1750 mg via INTRAVENOUS
  Filled 2021-04-29: qty 350

## 2021-04-29 MED ORDER — SODIUM CHLORIDE 0.9 % IV SOLN
2.0000 g | Freq: Two times a day (BID) | INTRAVENOUS | Status: DC
  Administered 2021-04-29: 2 g via INTRAVENOUS
  Filled 2021-04-29: qty 2

## 2021-04-29 MED ORDER — PROSOURCE TF PO LIQD
90.0000 mL | Freq: Three times a day (TID) | ORAL | Status: DC
  Administered 2021-04-29 – 2021-05-01 (×8): 90 mL
  Filled 2021-04-29 (×8): qty 90

## 2021-04-29 MED ORDER — SODIUM CHLORIDE 0.9 % IV SOLN
INTRAVENOUS | Status: DC | PRN
  Administered 2021-04-29: 500 mL via INTRAVENOUS

## 2021-04-29 MED ORDER — NOREPINEPHRINE 16 MG/250ML-% IV SOLN
0.0000 ug/min | INTRAVENOUS | Status: DC
  Administered 2021-04-29: 5 ug/min via INTRAVENOUS
  Administered 2021-04-30: 36 ug/min via INTRAVENOUS
  Administered 2021-05-01: 40 ug/min via INTRAVENOUS
  Administered 2021-05-01: 34 ug/min via INTRAVENOUS
  Administered 2021-05-01 – 2021-05-02 (×2): 40 ug/min via INTRAVENOUS
  Administered 2021-05-02: 60 ug/min via INTRAVENOUS
  Filled 2021-04-29 (×7): qty 250

## 2021-04-29 MED ORDER — PANCRELIPASE (LIP-PROT-AMYL) 10440-39150 UNITS PO TABS
20880.0000 [IU] | ORAL_TABLET | Freq: Once | ORAL | Status: AC
  Administered 2021-04-29: 20880 [IU]
  Filled 2021-04-29: qty 2

## 2021-04-29 MED ORDER — SODIUM CHLORIDE 0.9 % IV SOLN
1.0000 g | Freq: Every day | INTRAVENOUS | Status: DC
  Administered 2021-04-29: 1 g via INTRAVENOUS
  Filled 2021-04-29 (×2): qty 1

## 2021-04-29 MED ORDER — VANCOMYCIN VARIABLE DOSE PER UNSTABLE RENAL FUNCTION (PHARMACIST DOSING): Status: DC

## 2021-04-29 MED ORDER — VANCOMYCIN HCL IN DEXTROSE 1-5 GM/200ML-% IV SOLN: 1000.0000 mg | INTRAVENOUS | Status: DC

## 2021-04-29 MED ORDER — VASOPRESSIN 20 UNITS/100 ML INFUSION FOR SHOCK
0.0000 [IU]/min | INTRAVENOUS | Status: DC
  Administered 2021-04-29 – 2021-05-01 (×5): 0.03 [IU]/min via INTRAVENOUS
  Administered 2021-05-02: 0.04 [IU]/min via INTRAVENOUS
  Administered 2021-05-02: 0.03 [IU]/min via INTRAVENOUS
  Filled 2021-04-29 (×7): qty 100

## 2021-04-29 MED ORDER — SODIUM BICARBONATE 8.4 % IV SOLN
100.0000 meq | Freq: Once | INTRAVENOUS | Status: AC
  Administered 2021-04-29: 100 meq via INTRAVENOUS

## 2021-04-29 MED ORDER — MIDAZOLAM 50MG/50ML (1MG/ML) PREMIX INFUSION
0.5000 mg/h | INTRAVENOUS | Status: DC
  Administered 2021-04-29: 3 mg/h via INTRAVENOUS
  Administered 2021-04-30: 8 mg/h via INTRAVENOUS
  Administered 2021-04-30 (×2): 10 mg/h via INTRAVENOUS
  Administered 2021-04-30: 7 mg/h via INTRAVENOUS
  Administered 2021-05-01: 8 mg/h via INTRAVENOUS
  Administered 2021-05-01: 9 mg/h via INTRAVENOUS
  Administered 2021-05-01: 6 mg/h via INTRAVENOUS
  Administered 2021-05-01 – 2021-05-02 (×2): 9 mg/h via INTRAVENOUS
  Filled 2021-04-29 (×10): qty 50

## 2021-04-29 MED ORDER — STERILE WATER FOR INJECTION IV SOLN
INTRAVENOUS | Status: DC
  Filled 2021-04-29: qty 1000

## 2021-04-29 MED ORDER — SODIUM BICARBONATE 650 MG PO TABS
650.0000 mg | ORAL_TABLET | Freq: Once | ORAL | Status: AC
  Administered 2021-04-29: 650 mg
  Filled 2021-04-29: qty 1

## 2021-04-29 MED ORDER — DEXMEDETOMIDINE HCL IN NACL 400 MCG/100ML IV SOLN
0.4000 ug/kg/h | INTRAVENOUS | Status: DC
  Administered 2021-04-29: 0.8 ug/kg/h via INTRAVENOUS
  Filled 2021-04-29: qty 100

## 2021-04-29 NOTE — Progress Notes (Signed)
OVERNIGHT COVERAGE CRITICAL CARE PROGRESS NOTE  CTSP re: malfunctioning HD cathteter.  Patient is currently disconnected from CRRT.  Nurse reports intermittent occlusion and resistance hindering CRRT.  CXR reviewed and reveals shallow position of HD catheter tip in SVC.  At the time of clinical encounter, the HD ports are functioning but with noticeable resistance.  On inspection, the patient has a 15 cm HD catheter that appears to be in only about 11-12 cm with visible excursion of the catheter in and out even with respiratory movement.  Dressing taken down. Entire field sterilized. HD catheter advanced to 15 cm and resutured. Dressing reapplied. HD port function confirmed.  Start empiric cefepime/vancomycin for skin infection.  Critical care time: 30 minutes.  The treatment and management of the patient's condition was required based on the threat of imminent deterioration. This time reflects time spent by the physician evaluating, providing care and managing the critically ill patient's care. The time was spent at the immediate bedside (or on the same floor/unit and dedicated to this patient's care). Time involved in separately billable procedures is NOT included int he critical care time indicated above. Family meeting and update time may be included above if and only if the patient is unable/incompetent to participate in clinical interview and/or decision making, and the discussion was necessary to determining treatment decisions.  Renee Pain, MD Board Certified by the ABIM, [place]
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
## 2021-04-29 NOTE — Progress Notes (Signed)
IV consult placed for 2nd IV site. Pt has Simms with pigtail that can be used. Additional IV not needed at this time per bedside RN.

## 2021-04-29 NOTE — Progress Notes (Signed)
[place] Progress Note Patient Name: Kristopher [name] DOB: 1945/11/08 MRN: [id number]   Date of Service  04/29/2021  HPI/Events of Note  Patient did not tolerate the increase in rate to 33 due to air trapping.   eICU Interventions  Plan: Return PRVC rate to 30.     Intervention Category Major Interventions: Respiratory failure - evaluation and management  Tailer Volkert Eugene 04/29/2021, 5:04 AM

## 2021-04-29 NOTE — Progress Notes (Signed)
ANTICOAGULATION CONSULT NOTE - Follow Up Consult  Pharmacy Consult for heparin Indication:  possible PE  No Known Allergies  Patient Measurements: Height: '5\' 9"'$  (175.3 cm) Weight: 81.8 kg (180 lb 5.4 oz) IBW/kg (Calculated) : 70.7 Heparin Dosing Weight: 81.8 kg  Vital Signs: Temp: 96.2 F (35.7 C) (07/18 1942) Temp Source: Axillary (07/18 1942) BP: 139/57 (07/18 2027) Pulse Rate: 75 (07/18 2027)  Labs: Recent Labs    04/27/21 0350 04/28/21 2131 04/29/21 0348 04/29/21 0532 04/29/21 0620 04/29/21 0818 04/29/21 0827 04/29/21 1627 04/29/21 1717 04/29/21 1940  HGB 9.7*  --    < > 8.8* 9.5* 16.3  --   --  11.2*  --   HCT 30.2*  --    < > 27.3* 28.0* 48.0  --   --  33.0*  --   PLT 149*  --   --  172  --   --   --   --   --   --   APTT  --   --   --  136*  --   --   --   --   --   --   HEPARINUNFRC  --  0.23*  --   --   --   --  0.27*  --   --  0.41  CREATININE 6.00*  --   --  4.92*  --   --   --  3.61*  --   --    < > = values in this interval not displayed.     Estimated Creatinine Clearance: 17.7 mL/min (A) (by C-G formula based on SCr of 3.61 mg/dL (H)).   Assessment: 75 yo male presented with acute respiratory failure requiring intubation 6/26 with slow progress. Required bronchoscopy 7/17 due to hypotension and desaturation when starting HD (tolerated ~15 min) before changing to CRRT. Pharmacy consulted to start heparin for possible PE.  Heparin level 0.41 on 1550 units/hr.   Goal of Therapy:  Heparin level 0.3-0.7 units/ml Monitor platelets by anticoagulation protocol: Yes   Plan:  Continue heparin infusion to 1550 units/hr Daily heparin level and CBC  Alanda Slim, PharmD, [facility] Clinical Pharmacist Please see AMION for all Pharmacists' Contact Phone Numbers 04/29/2021, 8:42 PM
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
## 2021-04-29 NOTE — Progress Notes (Signed)
[place] Progress Note Patient Name: HAYTHAM VAIRO DOB: Jun 19, 1946 MRN: [id number]   Date of Service  04/29/2021  HPI/Events of Note  ABG on 100%/PRVC 30/TV 560/P 14 = 7.165/75.5/83/27.4.  eICU Interventions  Plan: Increase PRVC rate to 33.  NaHCO3 100 meq IV now. NaHCO3 IV infusion to run IV at 75 mL/hour. Repeat ABG at 8 AM.     Intervention Category Major Interventions: Acid-Base disturbance - evaluation and management;Respiratory failure - evaluation and management  Lysle Dingwall 04/29/2021, 4:29 AM

## 2021-04-29 NOTE — Progress Notes (Signed)
Pharmacy Antibiotic Note  Kristopher [name] is a 75 y.o. male admitted on 04/30/2021 with possible  HD catheter site skin infection .  Pharmacy has been consulted for Cefepime and Vancomycin dosing. Pt with AKI - trying to start iHD but having some trouble tolerating it.  Plan: Cefepime 1gm IV q24h Vancomycin '1750mg'$  IV now Will f/u HD schedule/tolerance for further Vancomycin dosing  Height: '5\' 9"'$  (175.3 cm) Weight: 79.9 kg (176 lb 2.4 oz) IBW/kg (Calculated) : 70.7  Temp (24hrs), Avg:97.8 F (36.6 C), Min:97.5 F (36.4 C), Max:98.2 F (36.8 C)  Recent Labs  Lab 04/18/2021 1730 04/28/2021 2217 04/23/21 0143 04/23/21 1354 04/24/21 0257 04/25/21 0040 04/26/21 0529 04/27/21 0350  WBC  --   --  11.8*  --  11.5* 10.6* 10.4 10.7*  CREATININE  --   --  2.81* 3.92* 4.51* 5.36* 5.28* 6.00*  LATICACIDVEN 1.0 0.8  --   --   --   --   --   --     Estimated Creatinine Clearance: 10.6 mL/min (A) (by C-G formula based on SCr of 6 mg/dL (H)).    No Known Allergies  Antimicrobials this admission: 7/18 Vanc >>  7/18 Cefepime >>    Microbiology results: 7/12 Trach asp: negative  Thank you for allowing pharmacy to be a part of this patient's care.  Sherlon Handing, PharmD, BCPS Please see amion for complete clinical pharmacist phone list 04/29/2021 12:38 AM

## 2021-04-29 NOTE — Procedures (Signed)
Central Venous Catheter Insertion Procedure Note  Kristopher [name]  [id number]  February 05, 1946  Date:04/29/21  Time:11:37 AM   Provider Performing:Kinney Sackmann Chauncey Cruel Iona Beard   Procedure: Insertion of Non-tunneled Central Venous Catheter(36556)with US guidance [id number])    Indication(s) Hemodialysis  Consent Risks of the procedure as well as the alternatives and risks of each were explained to the patient and/or caregiver.  Consent for the procedure was obtained and is signed in the bedside chart  Anesthesia Topical only with 1% lidocaine   Timeout Verified patient identification, verified procedure, site/side was marked, verified correct patient position, special equipment/implants available, medications/allergies/relevant history reviewed, required imaging and test results available.  Sterile Technique Maximal sterile technique including full sterile barrier drape, hand hygiene, sterile gown, sterile gloves, mask, hair covering, sterile ultrasound probe cover (if used).  Procedure Description Area of catheter insertion was cleaned with chlorhexidine and draped in sterile fashion.   With real-time ultrasound guidance a HD catheter was placed into the left femoral vein.  Nonpulsatile blood flow and easy flushing noted in all ports.  The catheter was sutured in place and sterile dressing applied.     Complications/Tolerance None; patient tolerated the procedure well. Chest x-ray is not ordered for femoral cannulation.  EBL Minimal  Specimen(s) None  Kristopher [name]., MSN, APRN, AGACNP-BC [facility]  04/29/2021 , 11:38 AM  Please see [email or web address] for pager details  If no response, please call [phone] After hours, please call Elink at [phone]

## 2021-04-29 NOTE — Progress Notes (Signed)
ANTICOAGULATION CONSULT NOTE - Follow Up Consult  Pharmacy Consult for heparin Indication:  possible PE  No Known Allergies  Patient Measurements: Height: '5\' 9"'$  (175.3 cm) Weight: 81.8 kg (180 lb 5.4 oz) IBW/kg (Calculated) : 70.7 Heparin Dosing Weight: 81.8 kg  Vital Signs: Temp: 98.2 F (36.8 C) (07/18 1216) Temp Source: Oral (07/18 1216) BP: 116/49 (07/18 1330) Pulse Rate: 90 (07/18 1400)  Labs: Recent Labs    04/27/21 0350 04/28/21 2131 04/29/21 0348 04/29/21 0532 04/29/21 0620 04/29/21 0818 04/29/21 0827  HGB 9.7*  --    < > 8.8* 9.5* 16.3  --   HCT 30.2*  --    < > 27.3* 28.0* 48.0  --   PLT 149*  --   --  172  --   --   --   APTT  --   --   --  136*  --   --   --   HEPARINUNFRC  --  0.23*  --   --   --   --  0.27*  CREATININE 6.00*  --   --  4.92*  --   --   --    < > = values in this interval not displayed.    Estimated Creatinine Clearance: 13 mL/min (A) (by C-G formula based on SCr of 4.92 mg/dL (H)).   Assessment: 75 yo male presented with acute respiratory failure requiring intubation 6/26 with slow progress. Required bronchoscopy 7/17 due to hypotension and desaturation when starting HD (tolerated ~15 min) before changing to CRRT. Pharmacy consulted to start heparin for possible PE.  Heparin level 0.27 today, on 1400 units/hr. Heparin was held while a new line was placed for CRRT and then resumed after confirmation of no s/sx of bleeding.   Goal of Therapy:  Heparin level 0.3-0.7 units/ml Monitor platelets by anticoagulation protocol: Yes   Plan:  Increase heparin infusion to 1550 units/hr Order heparin level in 8 hours Daily heparin level and CBC  Liz Beach, PharmD Pharmacy Resident 04/29/2021,2:57 PM

## 2021-04-29 NOTE — Progress Notes (Signed)
Pharmacy Antibiotic Note  Kristopher [name] is a 75 y.o. male admitted on 05/06/2021 with  HD catheter site skin infection .  Pharmacy has been consulted for vancomycin and cefepime dosing. Patient was initially on HD but had trouble tolerating it; CRRT started 7/18 '@1200'$ .  Plan: Cefepime 2gm IV every 12h Vancomycin '1000mg'$  IV every 24h Monitor CRRT tolerance, renal function  Height: '5\' 9"'$  (175.3 cm) Weight: 81.8 kg (180 lb 5.4 oz) IBW/kg (Calculated) : 70.7  Temp (24hrs), Avg:97.8 F (36.6 C), Min:97.5 F (36.4 C), Max:98.2 F (36.8 C)  Recent Labs  Lab 04/14/2021 1730 04/27/2021 2217 04/23/21 0143 04/24/21 0257 04/25/21 0040 04/26/21 0529 04/27/21 0350 04/29/21 0532  WBC  --   --    < > 11.5* 10.6* 10.4 10.7* 11.3*  CREATININE  --   --    < > 4.51* 5.36* 5.28* 6.00* 4.92*  LATICACIDVEN 1.0 0.8  --   --   --   --   --   --    < > = values in this interval not displayed.    Estimated Creatinine Clearance: 13 mL/min (A) (by C-G formula based on SCr of 4.92 mg/dL (H)).    No Known Allergies  Antimicrobials this admission: Cefepime 7/18 >>  Vancomycin 7/18 >>   Dose adjustments this admission: Cefepime changed from 1gm q24h to 2gm q12h due to a change from HD to CRRT. Vancomycin scheduled following loading dose.   Microbiology results: 7/12 Sputum: negF    Thank you for allowing pharmacy to be a part of this patient's care.  Liz Beach, PharmD Pharmacy Resident 04/29/2021 2:33 PM

## 2021-04-29 NOTE — Progress Notes (Signed)
[facility] Progress Note   Subjective:    Events noted-  BP drop again 15 minutes into HD yesterday-  then placed on CRRT -   had to have catheter adjusted but still did not work, not much overall RRT received -  for new cath now   Objective Vitals:   04/29/21 0905 04/29/21 0915 04/29/21 0930 04/29/21 1000  BP: (!) 112/51 (!) 127/51 (!) 119/53 (!) 115/52  Pulse:  (!) 118 (!) 109 (!) 107  Resp:  (!) 26 (!) 25 (!) 28  Temp:      TempSrc:      SpO2:  92% 92% 91%  Weight:      Height:       Physical Exam General: intubated, sedated on fentanyl - just staring-  no commands today  Heart: RRR Lungs: coarse ant Abdomen: soft, no apparent tenderness, mod distended still Extremities: 2+  diffuse edema Dialysis Access:  RIJ temp HD catheter-  due to be replaced   Additional Objective Labs: Basic Metabolic Panel: Recent Labs  Lab 04/26/21 0529 04/26/21 1118 04/27/21 0350 04/27/21 1700 04/28/21 0501 04/28/21 1734 04/29/21 0532 04/29/21 0620 04/29/21 0818  NA 139  --  139  --   --    < > 139 141 139  K 4.2   < > 4.5 5.2* 5.4*   < > 5.0 4.9 4.9  CL 103  --  102  --   --   --  102  --   --   CO2 25  --  24  --   --   --  30  --   --   GLUCOSE 132*  --  133*  --   --   --  116*  --   --   BUN 88*  --  100*  --   --   --  89*  --   --   CREATININE 5.28*  --  6.00*  --   --   --  4.92*  --   --   CALCIUM 7.3*  --  7.1*  --   --   --  7.0*  --   --   PHOS  --    < > 10.1* 10.4* 10.1*  --  7.5*  --   --    < > = values in this interval not displayed.   Liver Function Tests: Recent Labs  Lab 04/26/2021 1657 04/23/21 1354 04/24/21 0257 04/25/21 0040 04/26/21 0529 04/29/21 0532  AST 23  --  27  --  15  --   ALT 26  --  29  --  21  --   ALKPHOS 61  --  73  --  62  --   BILITOT 1.1  --  0.8  --  0.7  --   PROT 6.4*  --  6.4*  --  5.7*  --   ALBUMIN 1.4*   < > 1.5* 1.3* 1.4* 1.3*   < > = values in this interval not displayed.   No results for input(s): LIPASE,  AMYLASE in the last 168 hours. CBC: Recent Labs  Lab 04/24/21 0257 04/25/21 0040 04/25/21 1055 04/26/21 0529 04/27/21 0350 04/29/21 0348 04/29/21 0532 04/29/21 0620 04/29/21 0818  WBC 11.5* 10.6*  --  10.4 10.7*  --  11.3*  --   --   NEUTROABS 8.7*  --   --  7.6  --   --   --   --   --  HGB 11.9* 10.5*   < > 10.1* 9.7*   < > 8.8* 9.5* 16.3  HCT 36.0* 32.0*   < > 31.0* 30.2*   < > 27.3* 28.0* 48.0  MCV 86.1 85.8  --  87.1 87.5  --  88.9  --   --   PLT 148* 110*  --  118* 149*  --  172  --   --    < > = values in this interval not displayed.   Blood Culture    Component Value Date/Time   SDES TRACHEAL ASPIRATE 04/23/2021 1531   SPECREQUEST NONE 04/23/2021 1531   CULT  04/23/2021 1531    FEW Consistent with normal respiratory flora. No Pseudomonas species isolated Performed at [facility] [address], [place], [place] [address]    REPTSTATUS 04/26/2021 FINAL 04/23/2021 1531    Cardiac Enzymes: No results for input(s): CKTOTAL, CKMB, CKMBINDEX, TROPONINI in the last 168 hours. CBG: Recent Labs  Lab 04/28/21 1534 04/28/21 1909 04/28/21 2326 04/29/21 0307 04/29/21 0710  GLUCAP 152* 157* 124* 118* 118*   Iron Studies: No results for input(s): IRON, TIBC, TRANSFERRIN, FERRITIN in the last 72 hours. @lablastinr3 @ Studies/Results: DG CHEST PORT 1 VIEW  Result Date: 04/29/2021 CLINICAL DATA:  75 year old male with history of respiratory failure. EXAM: PORTABLE CHEST 1 VIEW COMPARISON:  Chest x-ray 04/27/2021. FINDINGS: An endotracheal tube is in place with tip 4.2 cm above the carina. There is a right-sided internal jugular central venous catheter with tip terminating in the mid superior vena cava. A feeding tube is seen extending into the abdomen, however, the tip of the feeding tube extends below the lower margin of the image. Lung volumes are low. Widespread but patchy areas of interstitial prominence and airspace consolidation are noted throughout the lungs  bilaterally (right greater than left), with significantly worsened aeration throughout the right lung. More dense airspace consolidation in the left base also noted. Small left pleural effusion. No definite right pleural effusion. No pneumothorax. No evidence of pulmonary edema. Heart size is normal. The patient is rotated to the left on today's exam, resulting in distortion of the mediastinal contours and reduced diagnostic sensitivity and specificity for mediastinal pathology. Atherosclerotic calcifications in the thoracic aorta. IMPRESSION: 1. Support apparatus, as above. 2. Worsening multilobar bilateral pneumonia, with small left pleural effusion. 3. Aortic atherosclerosis. Electronically Signed   By: Vinnie Langton M.D.   On: 04/29/2021 08:01   DG Chest Port 1 View  Result Date: 04/27/2021 CLINICAL DATA:  Acute respiratory failure EXAM: PORTABLE CHEST 1 VIEW COMPARISON:  04/26/2021, 04/23/2021 FINDINGS: Endotracheal tube tip about 2.9 cm superior to carina. 2 esophageal tubes are present, tips below the diaphragm but incompletely visualized. Right IJ central venous catheter tip over the SVC origin. Slightly improved aeration since yesterday's radiograph. Residual interstitial opacity and probable small effusions. Stable cardiomediastinal silhouette with aortic atherosclerosis. No pneumothorax. IMPRESSION: 1. Support lines and tubes as above. 2. Slight decreased pulmonary infiltrates or edema since yesterday's radiograph. Suspect that there are small pleural effusions. Electronically Signed   By: Donavan Foil M.D.   On: 04/27/2021 22:49   Medications:  sodium chloride 10 mL/hr at 04/29/21 1000   ceFEPime (MAXIPIME) IV Stopped (04/29/21 0342)   feeding supplement (OSMOLITE 1.5 CAL) 50 mL/hr at 04/29/21 0700   fentaNYL infusion INTRAVENOUS 300 mcg/hr (04/29/21 1000)   heparin 1,400 Units/hr (04/29/21 1000)   prismasol BGK 2/2.5 dialysis solution 1,800 mL/hr at 04/29/21 0612   prismasol BGK 2/2.5  replacement solution 400 mL/hr at 04/28/21 2245   prismasol BGK 2/2.5 replacement solution 200 mL/hr at 04/28/21 2246    ALPRAZolam  0.25 mg Per Tube BID   chlorhexidine gluconate (MEDLINE KIT)  15 mL Mouth Rinse BID   Chlorhexidine Gluconate Cloth  6 each Topical Daily   docusate  100 mg Per Tube BID   doxazosin  2 mg Per Tube Daily   feeding supplement (PROSource TF)  45 mL Per Tube TID   fentaNYL (SUBLIMAZE) injection  25 mcg Intravenous Once   FLUoxetine  20 mg Per Tube Daily   insulin aspart  0-9 Units Subcutaneous Q4H   mouth rinse  15 mL Mouth Rinse 10 times per day   pantoprazole (PROTONIX) IV  40 mg Intravenous Q24H   polyethylene glycol  17 g Per Tube BID   senna  1 tablet Per Tube BID   sodium chloride flush  10-40 mL Intracatheter Q12H   vancomycin variable dose per unstable renal function (pharmacist dosing)   Does not apply See admin instructions    Assessment/Plan **AKI: olig/anuric.  Multifactorial with respiratory failure secondary to aspiration, possibly sepsis, urinary retention noted in [facility] by RN.  Renal US - known solitary kidney with cysts noted < 10.  No hydronephrosis. UA from foley with blood, 1+ protein, + WBC, FeNa not consistent with prerenal --Required RRT early AM 7/12 for refractory K, had HD 7/14 but only 2hr due to clotting cicuit, HD Sat overnight complicated by marked hypotension 52mn into tx requiring d/c of treatment --Concern for dialyzer reaction for the episode with HD - given worsening pulmonary status with possible edema re attempted HD yesterday with generous flush of filter prior to treatment I- same outcome.  Having the HD unit search for true hypoallergenic dialyzer to try but now think is fine idea to put on CRRT for more effective volume removal-  all 2 K bath, no circuit heparin -  attempting to remove 100 per hour, titrating up to max of 200 per hour    **VDRF: vent support per PCCM; headed for trach soon.  Desaturated with episode at start  of HD-  still on 100% fio2 = bronch unrevealing, CXR with worse infiltrates.   CTA would be very reasonable despite the risk of further AKI given his current pulmonary status is not compatible with longevity.    **HFpEF: BB,  hypervolemic currently with pulmonary edema on CXR.  UF  plan per above.  **DM: per primary  Cont full scope of care for now per family.   KLouis Meckel 04/29/2021, 10:35 AM  [employer]

## 2021-04-30 ENCOUNTER — Other Ambulatory Visit: Payer: Self-pay

## 2021-04-30 ENCOUNTER — Encounter (HOSPITAL_COMMUNITY): Payer: Self-pay | Admitting: Pulmonary Disease

## 2021-04-30 DIAGNOSIS — J9601 Acute respiratory failure with hypoxia: Secondary | ICD-10-CM | POA: Diagnosis not present

## 2021-04-30 LAB — APTT
aPTT: 165 seconds (ref 24–36)
aPTT: 182 seconds (ref 24–36)

## 2021-04-30 LAB — CBC
HCT: 28.3 % — ABNORMAL LOW (ref 39.0–52.0)
Hemoglobin: 8.8 g/dL — ABNORMAL LOW (ref 13.0–17.0)
MCH: 28.2 pg (ref 26.0–34.0)
MCHC: 31.1 g/dL (ref 30.0–36.0)
MCV: 90.7 fL (ref 80.0–100.0)
Platelets: 157 10*3/uL (ref 150–400)
RBC: 3.12 MIL/uL — ABNORMAL LOW (ref 4.22–5.81)
RDW: 17.4 % — ABNORMAL HIGH (ref 11.5–15.5)
WBC: 13.7 10*3/uL — ABNORMAL HIGH (ref 4.0–10.5)
nRBC: 0.4 % — ABNORMAL HIGH (ref 0.0–0.2)

## 2021-04-30 LAB — POCT I-STAT 7, (LYTES, BLD GAS, ICA,H+H)
Acid-Base Excess: 5 mmol/L — ABNORMAL HIGH (ref 0.0–2.0)
Acid-Base Excess: 1 mmol/L (ref 0.0–2.0)
Bicarbonate: 34.1 mmol/L — ABNORMAL HIGH (ref 20.0–28.0)
Bicarbonate: 31.1 mmol/L — ABNORMAL HIGH (ref 20.0–28.0)
Calcium, Ion: 1.15 mmol/L (ref 1.15–1.40)
Calcium, Ion: 1.14 mmol/L — ABNORMAL LOW (ref 1.15–1.40)
HCT: 27 % — ABNORMAL LOW (ref 39.0–52.0)
HCT: 38 % — ABNORMAL LOW (ref 39.0–52.0)
Hemoglobin: 9.2 g/dL — ABNORMAL LOW (ref 13.0–17.0)
Hemoglobin: 12.9 g/dL — ABNORMAL LOW (ref 13.0–17.0)
O2 Saturation: 95 %
O2 Saturation: 97 %
Patient temperature: 96.2
Patient temperature: 97.4
Potassium: 4.1 mmol/L (ref 3.5–5.1)
Potassium: 4 mmol/L (ref 3.5–5.1)
Sodium: 137 mmol/L (ref 135–145)
Sodium: 138 mmol/L (ref 135–145)
TCO2: 37 mmol/L — ABNORMAL HIGH (ref 22–32)
TCO2: 33 mmol/L — ABNORMAL HIGH (ref 22–32)
pCO2 arterial: 80 mmHg (ref 32.0–48.0)
pCO2 arterial: 72.7 mmHg (ref 32.0–48.0)
pH, Arterial: 7.231 — ABNORMAL LOW (ref 7.350–7.450)
pH, Arterial: 7.236 — ABNORMAL LOW (ref 7.350–7.450)
pO2, Arterial: 89 mmHg (ref 83.0–108.0)
pO2, Arterial: 108 mmHg (ref 83.0–108.0)

## 2021-04-30 LAB — GLUCOSE, CAPILLARY
Glucose-Capillary: 125 mg/dL — ABNORMAL HIGH (ref 70–99)
Glucose-Capillary: 128 mg/dL — ABNORMAL HIGH (ref 70–99)
Glucose-Capillary: 137 mg/dL — ABNORMAL HIGH (ref 70–99)
Glucose-Capillary: 157 mg/dL — ABNORMAL HIGH (ref 70–99)
Glucose-Capillary: 89 mg/dL (ref 70–99)
Glucose-Capillary: 95 mg/dL (ref 70–99)
Glucose-Capillary: 133 mg/dL — ABNORMAL HIGH (ref 70–99)

## 2021-04-30 LAB — RENAL FUNCTION PANEL
Albumin: 1.5 g/dL — ABNORMAL LOW (ref 3.5–5.0)
Albumin: 1.6 g/dL — ABNORMAL LOW (ref 3.5–5.0)
Anion gap: 8 (ref 5–15)
Anion gap: 7 (ref 5–15)
BUN: 51 mg/dL — ABNORMAL HIGH (ref 8–23)
BUN: 37 mg/dL — ABNORMAL HIGH (ref 8–23)
CO2: 26 mmol/L (ref 22–32)
CO2: 28 mmol/L (ref 22–32)
Calcium: 7.6 mg/dL — ABNORMAL LOW (ref 8.9–10.3)
Calcium: 7.8 mg/dL — ABNORMAL LOW (ref 8.9–10.3)
Chloride: 101 mmol/L (ref 98–111)
Chloride: 101 mmol/L (ref 98–111)
Creatinine, Ser: 2.68 mg/dL — ABNORMAL HIGH (ref 0.61–1.24)
Creatinine, Ser: 2.1 mg/dL — ABNORMAL HIGH (ref 0.61–1.24)
GFR, Estimated: 24 mL/min — ABNORMAL LOW (ref >60)
GFR, Estimated: 32 mL/min — ABNORMAL LOW (ref >60)
Glucose, Bld: 124 mg/dL — ABNORMAL HIGH (ref 70–99)
Glucose, Bld: 148 mg/dL — ABNORMAL HIGH (ref 70–99)
Phosphorus: 4.8 mg/dL — ABNORMAL HIGH (ref 2.5–4.6)
Phosphorus: 4.5 mg/dL (ref 2.5–4.6)
Potassium: 3.9 mmol/L (ref 3.5–5.1)
Potassium: 4.2 mmol/L (ref 3.5–5.1)
Sodium: 135 mmol/L (ref 135–145)
Sodium: 136 mmol/L (ref 135–145)

## 2021-04-30 LAB — HEPARIN LEVEL (UNFRACTIONATED): Heparin Unfractionated: 0.43 IU/mL (ref 0.30–0.70)

## 2021-04-30 LAB — MAGNESIUM: Magnesium: 2.4 mg/dL (ref 1.7–2.4)

## 2021-04-30 MED ORDER — ALBUMIN HUMAN 25 % IV SOLN
25.0000 g | Freq: Once | INTRAVENOUS | Status: AC
  Administered 2021-04-30: 25 g via INTRAVENOUS
  Filled 2021-04-30: qty 100

## 2021-04-30 MED ORDER — PRISMASOL BGK 4/2.5 32-4-2.5 MEQ/L EC SOLN
Status: DC
  Filled 2021-04-30 (×22): qty 5000

## 2021-04-30 MED ORDER — IPRATROPIUM-ALBUTEROL 0.5-2.5 (3) MG/3ML IN SOLN
3.0000 mL | Freq: Four times a day (QID) | RESPIRATORY_TRACT | Status: DC
  Administered 2021-04-30 – 2021-05-02 (×8): 3 mL via RESPIRATORY_TRACT
  Filled 2021-04-30 (×8): qty 3

## 2021-04-30 MED ORDER — ARTIFICIAL TEARS OPHTHALMIC OINT
TOPICAL_OINTMENT | Freq: Once | OPHTHALMIC | Status: AC
  Administered 2021-04-30: 1 via OPHTHALMIC
  Filled 2021-04-30: qty 3.5

## 2021-04-30 MED ORDER — SODIUM BICARBONATE 8.4 % IV SOLN
100.0000 meq | Freq: Once | INTRAVENOUS | Status: AC
  Administered 2021-04-30: 100 meq via INTRAVENOUS
  Filled 2021-04-30: qty 50

## 2021-04-30 MED ORDER — STERILE WATER FOR INJECTION IV SOLN
INTRAVENOUS | Status: DC
  Filled 2021-04-30: qty 1000

## 2021-04-30 NOTE — Progress Notes (Addendum)
ANTICOAGULATION CONSULT NOTE - Follow Up Consult  Pharmacy Consult for heparin Indication:  possible PE  No Known Allergies  Patient Measurements: Height: '5\' 9"'$  (175.3 cm) Weight: 78.2 kg (172 lb 6.4 oz) IBW/kg (Calculated) : 70.7 Heparin Dosing Weight: 81.8 kg  Vital Signs: Temp: 97.4 F (36.3 C) (07/19 0711) Temp Source: Axillary (07/19 0711) BP: 103/45 (07/19 0901) Pulse Rate: 79 (07/19 0900)  Labs: Recent Labs    04/29/21 0532 04/29/21 0620 04/29/21 0827 04/29/21 1627 04/29/21 1717 04/29/21 1940 04/30/21 0318 04/30/21 0401 04/30/21 0438 04/30/21 0817  HGB 8.8*   < >  --   --    < >  --  8.8* 9.2*  --  12.9*  HCT 27.3*   < >  --   --    < >  --  28.3* 27.0*  --  38.0*  PLT 172  --   --   --   --   --  157  --   --   --   APTT 136*  --   --   --   --   --  165*  --  182*  --   HEPARINUNFRC  --   --  0.27*  --   --  0.41 0.43  --   --   --   CREATININE 4.92*  --   --  3.61*  --   --  2.68*  --   --   --    < > = values in this interval not displayed.    Estimated Creatinine Clearance: 23.8 mL/min (A) (by C-G formula based on SCr of 2.68 mg/dL (H)).   Assessment: 75 yo male presented with acute respiratory failure requiring intubation 6/26 with slow progress. Required bronchoscopy 7/17 due to hypotension and desaturation when starting HD (tolerated ~15 min) before changing to CRRT. Pharmacy consulted to start heparin for possible PE.  Heparin level remains therapeutic at 0.43 (2nd level) on 1550 units/hr. aPTT noted to be checked twice and be prolonged (once from line, once from peripheral stick- unsure if near IV heparin infusion and known to be spurious). H/H low but stable. Note of hematuria earlier in stay but this appears resolved. No overt bleeding noted per RN. CRRT running well without issues.   Goal of Therapy:  Heparin level 0.3-0.7 units/ml Monitor platelets by anticoagulation protocol: Yes   Plan:  Continue Heparin infusion to 1550 units/hr Recheck  Heparin level in 8 hours to confirm not accumulating. Monitor for any bleeding.  Continue daily Heparin level and CBC while on therapy.   Sloan Leiter, PharmD, BCPS, BCCCP Clinical Pharmacist Please refer to [facility] for [employer] numbers 04/30/2021, 9:36 AM    Discussed empiric treatment for PE with Dr. Lynetta Mare - ok to discontinue Heparin as do not suspect PE at this time.   Sloan Leiter, PharmD, BCPS, BCCCP Clinical Pharmacist Please refer to [facility] for [place] numbers 04/30/2021, 10:45 AM

## 2021-04-30 NOTE — Progress Notes (Signed)
Change made by MD

## 2021-04-30 NOTE — Progress Notes (Signed)
[facility] Progress Note Patient Name: Kristopher [name] DOB: 1946-07-17 MRN: [id number]   Date of Service  04/30/2021  HPI/Events of Note  Patient with soft blood pressures on dialysis limiting ultrafiltration and sedation goals.  eICU Interventions  Albumin 25 % 25 gm iv bolus x 1, arterial line ordered for more precise tracking of his blood pressure.        Kerry Kass Jasalyn Frysinger 04/30/2021, 10:05 PM

## 2021-04-30 NOTE — Progress Notes (Signed)
[facility] Progress Note Patient Name: LEVAUGHN WOITAS DOB: 11/23/1945 MRN: [id number]   Date of Service  04/30/2021  HPI/Events of Note  ABG on 100%/PRVC 30/TV 560/P 10 = 7.23/80/89/34. Patient did not tolerate PRVC rate increase to 33 yesterday morning d/t air trapping.   eICU Interventions  Plan: NaHCO3 100 meq IV now. NaHCO3 IV infusion to run IV at 50 mL/hour. Repeat ABG at 8:30 AM.     Intervention Category Major Interventions: Acid-Base disturbance - evaluation and management;Respiratory failure - evaluation and management  Lysle Dingwall 04/30/2021, 4:29 AM

## 2021-04-30 NOTE — Progress Notes (Signed)
[facility] Progress Note   Subjective:    CRRT finally started and running well-  hemodynamically unstable - on norepi and vaso- no UOP-  negative 1300 with CRRT  Objective Vitals:   04/30/21 0830 04/30/21 0845 04/30/21 0900 04/30/21 0901  BP: (!) 105/57 (!) 98/58 (!) 103/45 (!) 103/45  Pulse: 78 77 79   Resp: (!) 28 (!) 30 (!) 24   Temp:      TempSrc:      SpO2: 97% 97% 95%   Weight:      Height:       Physical Exam General: intubated, sedated on fentanyl - just staring-  no commands today  Heart: RRR Lungs: coarse ant Abdomen: soft, no apparent tenderness, mod distended still Extremities: 2+  diffuse edema Dialysis Access:  fem temp HD catheter-  replaced 7/18  Additional Objective Labs: Basic Metabolic Panel: Recent Labs  Lab 04/29/21 0532 04/29/21 0620 04/29/21 1627 04/29/21 1717 04/30/21 0318 04/30/21 0401 04/30/21 0817  NA 139   < > 136   < > 135 137 138  K 5.0   < > 4.4   < > 3.9 4.1 4.0  CL 102  --  102  --  101  --   --   CO2 30  --  26  --  26  --   --   GLUCOSE 116*  --  146*  --  124*  --   --   BUN 89*  --  67*  --  51*  --   --   CREATININE 4.92*  --  3.61*  --  2.68*  --   --   CALCIUM 7.0*  --  7.2*  --  7.6*  --   --   PHOS 7.5*  --  5.3*  --  4.8*  --   --    < > = values in this interval not displayed.   Liver Function Tests: Recent Labs  Lab 04/24/21 0257 04/25/21 0040 04/26/21 0529 04/29/21 0532 04/29/21 1627 04/30/21 0318  AST 27  --  15  --   --   --   ALT 29  --  21  --   --   --   ALKPHOS 73  --  62  --   --   --   BILITOT 0.8  --  0.7  --   --   --   PROT 6.4*  --  5.7*  --   --   --   ALBUMIN 1.5*   < > 1.4* 1.3* 1.4* 1.5*   < > = values in this interval not displayed.   No results for input(s): LIPASE, AMYLASE in the last 168 hours. CBC: Recent Labs  Lab 04/24/21 0257 04/25/21 0040 04/25/21 1055 04/26/21 0529 04/27/21 0350 04/29/21 0348 04/29/21 0532 04/29/21 0620 04/30/21 0318 04/30/21 0401  04/30/21 0817  WBC 11.5* 10.6*  --  10.4 10.7*  --  11.3*  --  13.7*  --   --   NEUTROABS 8.7*  --   --  7.6  --   --   --   --   --   --   --   HGB 11.9* 10.5*   < > 10.1* 9.7*   < > 8.8*   < > 8.8* 9.2* 12.9*  HCT 36.0* 32.0*   < > 31.0* 30.2*   < > 27.3*   < > 28.3* 27.0* 38.0*  MCV 86.1 85.8  --  87.1  87.5  --  88.9  --  90.7  --   --   PLT 148* 110*  --  118* 149*  --  172  --  157  --   --    < > = values in this interval not displayed.   Blood Culture    Component Value Date/Time   SDES TRACHEAL ASPIRATE 04/23/2021 1531   SPECREQUEST NONE 04/23/2021 1531   CULT  04/23/2021 1531    FEW Consistent with normal respiratory flora. No Pseudomonas species isolated Performed at [facility] [address], [place], [place] [address]    REPTSTATUS 04/26/2021 FINAL 04/23/2021 1531    Cardiac Enzymes: No results for input(s): CKTOTAL, CKMB, CKMBINDEX, TROPONINI in the last 168 hours. CBG: Recent Labs  Lab 04/29/21 1531 04/29/21 1940 04/29/21 2324 04/30/21 0315 04/30/21 0707  GLUCAP 138* 125* 138* 125* 128*   Iron Studies: No results for input(s): IRON, TIBC, TRANSFERRIN, FERRITIN in the last 72 hours. @lablastinr3 @ Studies/Results: DG CHEST PORT 1 VIEW  Result Date: 04/29/2021 CLINICAL DATA:  Pulmonary edema. EXAM: PORTABLE CHEST 1 VIEW COMPARISON:  Radiograph earlier today. FINDINGS: Endotracheal tube tip is 3.2 cm from the carina. Enteric tube is in place tip below the diaphragm not included in the field of view. Bilateral interstitial and alveolar opacities with slightly shifting pattern from earlier today, may be due to differences in rotation. Slight improvement in the right lung and worsening on the left. Otherwise no significant interval change. No pneumothorax. Small bilateral pleural effusions suspected. Stable heart size and mediastinal contours. IMPRESSION: 1. Bilateral interstitial and alveolar opacities with slightly shifting pattern from earlier today, may be  due to differences in rotation. Slight improvement in the right lung and worsening on the left. 2. Small bilateral pleural effusions. Electronically Signed   By: Keith Rake M.D.   On: 04/29/2021 19:38   DG CHEST PORT 1 VIEW  Result Date: 04/29/2021 CLINICAL DATA:  75 year old male with history of respiratory failure. EXAM: PORTABLE CHEST 1 VIEW COMPARISON:  Chest x-ray 04/27/2021. FINDINGS: An endotracheal tube is in place with tip 4.2 cm above the carina. There is a right-sided internal jugular central venous catheter with tip terminating in the mid superior vena cava. A feeding tube is seen extending into the abdomen, however, the tip of the feeding tube extends below the lower margin of the image. Lung volumes are low. Widespread but patchy areas of interstitial prominence and airspace consolidation are noted throughout the lungs bilaterally (right greater than left), with significantly worsened aeration throughout the right lung. More dense airspace consolidation in the left base also noted. Small left pleural effusion. No definite right pleural effusion. No pneumothorax. No evidence of pulmonary edema. Heart size is normal. The patient is rotated to the left on today's exam, resulting in distortion of the mediastinal contours and reduced diagnostic sensitivity and specificity for mediastinal pathology. Atherosclerotic calcifications in the thoracic aorta. IMPRESSION: 1. Support apparatus, as above. 2. Worsening multilobar bilateral pneumonia, with small left pleural effusion. 3. Aortic atherosclerosis. Electronically Signed   By: Vinnie Langton M.D.   On: 04/29/2021 08:01   Medications:  sodium chloride Stopped (04/30/21 0709)   ceFEPime (MAXIPIME) IV Stopped (04/29/21 2357)   dexmedetomidine (PRECEDEX) IV infusion Stopped (04/29/21 1822)   feeding supplement (OSMOLITE 1.5 CAL) 50 mL/hr at 04/29/21 2130   fentaNYL infusion INTRAVENOUS 400 mcg/hr (04/30/21 0900)   heparin 1,550 Units/hr  (04/30/21 0900)   midazolam 8 mg/hr (04/30/21 0900)   norepinephrine (  LEVOPHED) Adult infusion 6 mcg/min (04/30/21 0900)   prismasol BGK 2/2.5 dialysis solution 1,800 mL/hr at 04/30/21 0811   prismasol BGK 2/2.5 replacement solution 400 mL/hr at 04/29/21 2051   prismasol BGK 2/2.5 replacement solution 200 mL/hr at 04/29/21 2319    sodium bicarbonate (isotonic) infusion in sterile water 50 mL/hr at 04/30/21 0900   vancomycin     vasopressin 0.03 Units/min (04/30/21 0900)    ALPRAZolam  0.25 mg Per Tube BID   chlorhexidine gluconate (MEDLINE KIT)  15 mL Mouth Rinse BID   Chlorhexidine Gluconate Cloth  6 each Topical Daily   docusate  100 mg Per Tube BID   doxazosin  2 mg Per Tube Daily   feeding supplement (PROSource TF)  90 mL Per Tube TID   fentaNYL (SUBLIMAZE) injection  25 mcg Intravenous Once   FLUoxetine  20 mg Per Tube Daily   insulin aspart  0-9 Units Subcutaneous Q4H   mouth rinse  15 mL Mouth Rinse 10 times per day   pantoprazole (PROTONIX) IV  40 mg Intravenous Q24H   polyethylene glycol  17 g Per Tube BID   senna  1 tablet Per Tube BID   sodium chloride flush  10-40 mL Intracatheter Q12H    Assessment/Plan **AKI: olig/anuric.  Multifactorial with respiratory failure secondary to aspiration, possibly sepsis, urinary retention noted in [facility] by RN.  Renal US - sizes < 10.  No hydronephrosis. UA from foley with blood, 1+ protein, + WBC, FeNa not consistent with prerenal --Required RRT early AM 7/12 for refractory K, had HD 7/14 but only 2hr due to clotting cicuit, HD Sat overnight complicated by marked hypotension 47mn into tx requiring d/c of treatment- same result on Sunday --Concern for dialyzer reaction - eventually put on CRRT for more effective volume removal, finally have it going-  all 2 K bath, no circuit heparin -  attempting to remove 100 per hour, titrating up to max of 200 per hour-  since now on pressors will keep on CRRT-  change dialysate to 4 K-  we are getting the  hypoallergenic IHD filters so will have in case we need it when we transition again to IHD    **VDRF: vent support per PCCM; headed for trach soon.  Desaturated with episode at start of HD-  still on 100% fio2 = bronch unrevealing, CXR with worse infiltrates.   CTA would be very reasonable despite the risk of further AKI given his current pulmonary status is not compatible with longevity.   UF as able with CRRT   **HFpEF: BB,  hypervolemic currently with pulmonary edema on CXR.  UF  plan per above.  **DM: per primary  Cont full scope of care for now per family.   KLouis Meckel 04/30/2021, 9:08 AM  [employer]
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
## 2021-05-01 DIAGNOSIS — J9601 Acute respiratory failure with hypoxia: Secondary | ICD-10-CM | POA: Diagnosis not present

## 2021-05-01 LAB — POCT I-STAT 7, (LYTES, BLD GAS, ICA,H+H)
Acid-base deficit: 2 mmol/L (ref 0.0–2.0)
Bicarbonate: 27.1 mmol/L (ref 20.0–28.0)
Calcium, Ion: 1.23 mmol/L (ref 1.15–1.40)
HCT: 27 % — ABNORMAL LOW (ref 39.0–52.0)
Hemoglobin: 9.2 g/dL — ABNORMAL LOW (ref 13.0–17.0)
O2 Saturation: 85 %
Potassium: 4.4 mmol/L (ref 3.5–5.1)
Sodium: 137 mmol/L (ref 135–145)
TCO2: 29 mmol/L (ref 22–32)
pCO2 arterial: 77.5 mmHg (ref 32.0–48.0)
pH, Arterial: 7.152 — CL (ref 7.350–7.450)
pO2, Arterial: 66 mmHg — ABNORMAL LOW (ref 83.0–108.0)

## 2021-05-01 LAB — RENAL FUNCTION PANEL
Albumin: 1.9 g/dL — ABNORMAL LOW (ref 3.5–5.0)
Albumin: 1.7 g/dL — ABNORMAL LOW (ref 3.5–5.0)
Anion gap: 9 (ref 5–15)
Anion gap: 6 (ref 5–15)
BUN: 36 mg/dL — ABNORMAL HIGH (ref 8–23)
BUN: 28 mg/dL — ABNORMAL HIGH (ref 8–23)
CO2: 26 mmol/L (ref 22–32)
CO2: 26 mmol/L (ref 22–32)
Calcium: 8.2 mg/dL — ABNORMAL LOW (ref 8.9–10.3)
Calcium: 8 mg/dL — ABNORMAL LOW (ref 8.9–10.3)
Chloride: 100 mmol/L (ref 98–111)
Chloride: 104 mmol/L (ref 98–111)
Creatinine, Ser: 1.91 mg/dL — ABNORMAL HIGH (ref 0.61–1.24)
Creatinine, Ser: 1.57 mg/dL — ABNORMAL HIGH (ref 0.61–1.24)
GFR, Estimated: 36 mL/min — ABNORMAL LOW (ref >60)
GFR, Estimated: 46 mL/min — ABNORMAL LOW (ref >60)
Glucose, Bld: 157 mg/dL — ABNORMAL HIGH (ref 70–99)
Glucose, Bld: 117 mg/dL — ABNORMAL HIGH (ref 70–99)
Phosphorus: 4.5 mg/dL (ref 2.5–4.6)
Phosphorus: 4 mg/dL (ref 2.5–4.6)
Potassium: 4.4 mmol/L (ref 3.5–5.1)
Potassium: 4.5 mmol/L (ref 3.5–5.1)
Sodium: 135 mmol/L (ref 135–145)
Sodium: 136 mmol/L (ref 135–145)

## 2021-05-01 LAB — GLUCOSE, CAPILLARY
Glucose-Capillary: 144 mg/dL — ABNORMAL HIGH (ref 70–99)
Glucose-Capillary: 102 mg/dL — ABNORMAL HIGH (ref 70–99)
Glucose-Capillary: 110 mg/dL — ABNORMAL HIGH (ref 70–99)
Glucose-Capillary: 118 mg/dL — ABNORMAL HIGH (ref 70–99)
Glucose-Capillary: 93 mg/dL (ref 70–99)

## 2021-05-01 LAB — CBC
HCT: 27.8 % — ABNORMAL LOW (ref 39.0–52.0)
Hemoglobin: 8.3 g/dL — ABNORMAL LOW (ref 13.0–17.0)
MCH: 28.1 pg (ref 26.0–34.0)
MCHC: 29.9 g/dL — ABNORMAL LOW (ref 30.0–36.0)
MCV: 94.2 fL (ref 80.0–100.0)
Platelets: 162 10*3/uL (ref 150–400)
RBC: 2.95 MIL/uL — ABNORMAL LOW (ref 4.22–5.81)
RDW: 18 % — ABNORMAL HIGH (ref 11.5–15.5)
WBC: 13.6 10*3/uL — ABNORMAL HIGH (ref 4.0–10.5)
nRBC: 0.7 % — ABNORMAL HIGH (ref 0.0–0.2)

## 2021-05-01 LAB — MAGNESIUM: Magnesium: 2.3 mg/dL (ref 1.7–2.4)

## 2021-05-01 NOTE — Progress Notes (Signed)
Critical ABG results given to Med student Lorn Junes and Dr Lynetta Mare. No new orders at this time.

## 2021-05-01 NOTE — Progress Notes (Signed)
[facility] Progress Note   Subjective:    CRRT  running well-  hemodynamically unstable - on norepi and vaso still-  no UOP-  negative 1300 again with CRRT-  unable to pull more due to low BPS  Objective Vitals:   05/01/21 0741 05/01/21 0745 05/01/21 0748 05/01/21 0800  BP:   (!) 118/39 (!) 114/55  Pulse:  (!) 108 (!) 116 (!) 253  Resp:  (!) 26 (!) 26 (!) 30  Temp:      TempSrc:      SpO2: 95% 96% 97% 94%  Weight:      Height:       Physical Exam General: intubated, sedated on fentanyl - just staring-  no commands today  Heart: RRR Lungs: coarse ant Abdomen: soft, no apparent tenderness, mod distended still Extremities: 2+  diffuse edema Dialysis Access:  fem temp HD catheter-  replaced 7/18  Additional Objective Labs: Basic Metabolic Panel: Recent Labs  Lab 04/30/21 0318 04/30/21 0401 04/30/21 1600 05/01/21 0318 05/01/21 0827  NA 135   < > 136 135 137  K 3.9   < > 4.2 4.4 4.4  CL 101  --  101 100  --   CO2 26  --  28 26  --   GLUCOSE 124*  --  148* 157*  --   BUN 51*  --  37* 36*  --   CREATININE 2.68*  --  2.10* 1.91*  --   CALCIUM 7.6*  --  7.8* 8.2*  --   PHOS 4.8*  --  4.5 4.5  --    < > = values in this interval not displayed.   Liver Function Tests: Recent Labs  Lab 04/26/21 0529 04/29/21 0532 04/30/21 0318 04/30/21 1600 05/01/21 0318  AST 15  --   --   --   --   ALT 21  --   --   --   --   ALKPHOS 62  --   --   --   --   BILITOT 0.7  --   --   --   --   PROT 5.7*  --   --   --   --   ALBUMIN 1.4*   < > 1.5* 1.6* 1.9*   < > = values in this interval not displayed.   No results for input(s): LIPASE, AMYLASE in the last 168 hours. CBC: Recent Labs  Lab 04/26/21 0529 04/27/21 0350 04/29/21 0348 04/29/21 0532 04/29/21 0620 04/30/21 0318 04/30/21 0401 04/30/21 0817 05/01/21 0804 05/01/21 0827  WBC 10.4 10.7*  --  11.3*  --  13.7*  --   --  13.6*  --   NEUTROABS 7.6  --   --   --   --   --   --   --   --   --   HGB 10.1*  9.7*   < > 8.8*   < > 8.8*   < > 12.9* 8.3* 9.2*  HCT 31.0* 30.2*   < > 27.3*   < > 28.3*   < > 38.0* 27.8* 27.0*  MCV 87.1 87.5  --  88.9  --  90.7  --   --  94.2  --   PLT 118* 149*  --  172  --  157  --   --  162  --    < > = values in this interval not displayed.   Blood Culture    Component Value Date/Time  SDES TRACHEAL ASPIRATE 04/23/2021 1531   SPECREQUEST NONE 04/23/2021 1531   CULT  04/23/2021 1531    FEW Consistent with normal respiratory flora. No Pseudomonas species isolated Performed at [facility] [address], [place], [place] [address]    REPTSTATUS 04/26/2021 FINAL 04/23/2021 1531    Cardiac Enzymes: No results for input(s): CKTOTAL, CKMB, CKMBINDEX, TROPONINI in the last 168 hours. CBG: Recent Labs  Lab 04/30/21 1951 04/30/21 1955 04/30/21 2325 05/01/21 0315 05/01/21 0721  GLUCAP 89 95 133* 144* 102*   Iron Studies: No results for input(s): IRON, TIBC, TRANSFERRIN, FERRITIN in the last 72 hours. @lablastinr3 @ Studies/Results: DG CHEST PORT 1 VIEW  Result Date: 04/29/2021 CLINICAL DATA:  Pulmonary edema. EXAM: PORTABLE CHEST 1 VIEW COMPARISON:  Radiograph earlier today. FINDINGS: Endotracheal tube tip is 3.2 cm from the carina. Enteric tube is in place tip below the diaphragm not included in the field of view. Bilateral interstitial and alveolar opacities with slightly shifting pattern from earlier today, may be due to differences in rotation. Slight improvement in the right lung and worsening on the left. Otherwise no significant interval change. No pneumothorax. Small bilateral pleural effusions suspected. Stable heart size and mediastinal contours. IMPRESSION: 1. Bilateral interstitial and alveolar opacities with slightly shifting pattern from earlier today, may be due to differences in rotation. Slight improvement in the right lung and worsening on the left. 2. Small bilateral pleural effusions. Electronically Signed   By: Keith Rake M.D.    On: 04/29/2021 19:38   Medications:  sodium chloride Stopped (04/30/21 2326)   dexmedetomidine (PRECEDEX) IV infusion Stopped (04/29/21 1822)   feeding supplement (OSMOLITE 1.5 CAL) 50 mL/hr at 04/30/21 2200   fentaNYL infusion INTRAVENOUS 300 mcg/hr (05/01/21 0800)   midazolam 8 mg/hr (05/01/21 0800)   norepinephrine (LEVOPHED) Adult infusion 34 mcg/min (05/01/21 0800)   prismasol BGK 2/2.5 replacement solution 400 mL/hr at 04/30/21 2249   prismasol BGK 2/2.5 replacement solution 200 mL/hr at 05/01/21 0121   prismasol BGK 4/2.5 1,500 mL/hr at 05/01/21 8032   vasopressin 0.03 Units/min (05/01/21 0800)    ALPRAZolam  0.25 mg Per Tube BID   chlorhexidine gluconate (MEDLINE KIT)  15 mL Mouth Rinse BID   Chlorhexidine Gluconate Cloth  6 each Topical Daily   docusate  100 mg Per Tube BID   doxazosin  2 mg Per Tube Daily   feeding supplement (PROSource TF)  90 mL Per Tube TID   fentaNYL (SUBLIMAZE) injection  25 mcg Intravenous Once   FLUoxetine  20 mg Per Tube Daily   insulin aspart  0-9 Units Subcutaneous Q4H   ipratropium-albuterol  3 mL Nebulization Q6H   mouth rinse  15 mL Mouth Rinse 10 times per day   pantoprazole (PROTONIX) IV  40 mg Intravenous Q24H   polyethylene glycol  17 g Per Tube BID   senna  1 tablet Per Tube BID   sodium chloride flush  10-40 mL Intracatheter Q12H    Assessment/Plan **AKI: olig/anuric.  Multifactorial with respiratory failure secondary to aspiration, possibly sepsis, urinary retention noted in [facility] by RN.  Renal US - sizes < 10.  No hydronephrosis. UA from foley with blood, 1+ protein, + WBC, FeNa not consistent with prerenal --Required RRT early AM 7/12 for refractory K, had HD 7/14 but only 2hr due to clotting cicuit, HD Sat overnight complicated by marked hypotension 30mn into tx requiring d/c of treatment- same problem on Sunday, concern for dialyzer reaction  - eventually put on CRRT  for more effective volume removal, -  2 K pre and post but 4 k  dialysate, no circuit heparin -  attempting to remove 100 per hour, titrating up to max of 200 per hour-  since now on pressors will keep on CRRT-  no change-  we are getting the hypoallergenic IHD filters so will have in case we need it when and if we transition again to IHD    **VDRF: vent support per PCCM; headed for trach soon.  Desaturated with episode at start of HD-  still on 100% fio2 = bronch unrevealing, CXR with worse infiltrates.   CTA would be very reasonable despite the risk of further AKI given his current pulmonary status is not compatible with longevity.   UF as able with CRRT-  have not been able due to low BPs   **HFpEF: BB,  hypervolemic currently with pulmonary edema on CXR.  UF  plan per above.  **DM: per primary  Cont full scope of care for now per family.   Louis Meckel  05/01/2021, 9:17 AM  [employer]

## 2021-05-01 NOTE — Procedures (Signed)
Arterial Catheter Insertion Procedure Note  SAMIUL ACREY  [id number]  1945-12-19  Date:05/01/21  Time:12:19 AM    Provider Performing: Catha Brow    Procedure: Insertion of Arterial Line [phone]) without US guidance  Indication(s) Blood pressure monitoring and/or need for frequent ABGs  Consent Unable to obtain consent due to emergent nature of procedure.  Anesthesia None   Time Out Verified patient identification, verified procedure, site/side was marked, verified correct patient position, special equipment/implants available, medications/allergies/relevant history reviewed, required imaging and test results available.   Sterile Technique Maximal sterile technique including full sterile barrier drape, hand hygiene, sterile gown, sterile gloves, mask, hair covering, sterile ultrasound probe cover (if used).   Procedure Description Area of catheter insertion was cleaned with chlorhexidine and draped in sterile fashion. Without real-time ultrasound guidance an arterial catheter was placed into the left radial artery.  Appropriate arterial tracings confirmed on monitor.     Complications/Tolerance None; patient tolerated the procedure well.   EBL Minimal   Specimen(s) None
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
## 2021-05-02 ENCOUNTER — Encounter (HOSPITAL_COMMUNITY): Payer: Self-pay | Admitting: Pulmonary Disease

## 2021-05-02 DIAGNOSIS — Z515 Encounter for palliative care: Secondary | ICD-10-CM

## 2021-05-02 DIAGNOSIS — Z7189 Other specified counseling: Secondary | ICD-10-CM

## 2021-05-02 DIAGNOSIS — J9601 Acute respiratory failure with hypoxia: Secondary | ICD-10-CM | POA: Diagnosis not present

## 2021-05-02 LAB — CBC WITH DIFFERENTIAL/PLATELET
Abs Immature Granulocytes: 0.64 10*3/uL — ABNORMAL HIGH (ref 0.00–0.07)
Basophils Absolute: 0.1 10*3/uL (ref 0.0–0.1)
Basophils Relative: 0 %
Eosinophils Absolute: 0 10*3/uL (ref 0.0–0.5)
Eosinophils Relative: 0 %
HCT: 25.7 % — ABNORMAL LOW (ref 39.0–52.0)
Hemoglobin: 7.7 g/dL — ABNORMAL LOW (ref 13.0–17.0)
Immature Granulocytes: 3 %
Lymphocytes Relative: 7 %
Lymphs Abs: 1.4 10*3/uL (ref 0.7–4.0)
MCH: 29.1 pg (ref 26.0–34.0)
MCHC: 30 g/dL (ref 30.0–36.0)
MCV: 97 fL (ref 80.0–100.0)
Monocytes Absolute: 2.5 10*3/uL — ABNORMAL HIGH (ref 0.1–1.0)
Monocytes Relative: 12 %
Neutro Abs: 15.7 10*3/uL — ABNORMAL HIGH (ref 1.7–7.7)
Neutrophils Relative %: 78 %
Platelets: 151 10*3/uL (ref 150–400)
RBC: 2.65 MIL/uL — ABNORMAL LOW (ref 4.22–5.81)
RDW: 18.5 % — ABNORMAL HIGH (ref 11.5–15.5)
WBC: 20.3 10*3/uL — ABNORMAL HIGH (ref 4.0–10.5)
nRBC: 1.7 % — ABNORMAL HIGH (ref 0.0–0.2)

## 2021-05-02 LAB — GLUCOSE, CAPILLARY
Glucose-Capillary: 111 mg/dL — ABNORMAL HIGH (ref 70–99)
Glucose-Capillary: 102 mg/dL — ABNORMAL HIGH (ref 70–99)
Glucose-Capillary: 159 mg/dL — ABNORMAL HIGH (ref 70–99)
Glucose-Capillary: 54 mg/dL — ABNORMAL LOW (ref 70–99)

## 2021-05-02 LAB — RENAL FUNCTION PANEL
Albumin: 1.6 g/dL — ABNORMAL LOW (ref 3.5–5.0)
Anion gap: 5 (ref 5–15)
BUN: 30 mg/dL — ABNORMAL HIGH (ref 8–23)
CO2: 25 mmol/L (ref 22–32)
Calcium: 8.2 mg/dL — ABNORMAL LOW (ref 8.9–10.3)
Chloride: 105 mmol/L (ref 98–111)
Creatinine, Ser: 1.68 mg/dL — ABNORMAL HIGH (ref 0.61–1.24)
GFR, Estimated: 42 mL/min — ABNORMAL LOW (ref >60)
Glucose, Bld: 93 mg/dL (ref 70–99)
Phosphorus: 4.7 mg/dL — ABNORMAL HIGH (ref 2.5–4.6)
Potassium: 5 mmol/L (ref 3.5–5.1)
Sodium: 135 mmol/L (ref 135–145)

## 2021-05-02 LAB — MAGNESIUM: Magnesium: 2.5 mg/dL — ABNORMAL HIGH (ref 1.7–2.4)

## 2021-05-02 MED ORDER — PHENYLEPHRINE HCL-NACL 10-0.9 MG/250ML-% IV SOLN: 0.0000 ug/min | INTRAVENOUS | Status: DC

## 2021-05-02 MED ORDER — PHENYLEPHRINE CONCENTRATED 100MG/250ML (0.4 MG/ML) INFUSION SIMPLE
0.0000 ug/min | INTRAVENOUS | Status: DC
  Administered 2021-05-02: 150 ug/min via INTRAVENOUS
  Filled 2021-05-02: qty 250

## 2021-05-02 MED ORDER — DEXTROSE 50 % IV SOLN
25.0000 g | INTRAVENOUS | Status: AC
  Administered 2021-05-02: 25 g via INTRAVENOUS
  Filled 2021-05-02: qty 50

## 2021-05-02 MED ORDER — PHENYLEPHRINE HCL-NACL 10-0.9 MG/250ML-% IV SOLN
INTRAVENOUS | Status: AC
  Administered 2021-05-02: 150 ug/min via INTRAVENOUS
  Filled 2021-05-02: qty 250

## 2021-05-13 NOTE — Progress Notes (Signed)
Hypoglycemic Event  CBG: 54  Treatment: D50 50 mL (25 gm)  Symptoms: None  Follow-up CBG: [id number] CBG Result:159  Possible Reasons for Event: Other: TF rate decreased  Comments/MD notified:hypoglycemic protocol followed    Ernestene Kiel

## 2021-05-13 NOTE — Procedures (Signed)
Extubation Procedure Note  Patient Details:   Name: Kristopher [name] DOB: [birth year] MRN: [id number]   Airway Documentation:    Vent end date: May 24, 2021 Vent end time: 1332   Evaluation Pt extubated to RA per comfort care orders.  [name] 05/24/2021, 1:32 PM
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
## 2021-05-13 NOTE — Progress Notes (Signed)
Spoke to son, Cristie Hem, who expressed that he would like to transition the patient to comfort care on May 10, 2021 at 12:00 pm, when all family is available to attend. He would like a call from the CCM team in the am to confirm that this can be planned accordingly. All questions answered at this time. Will pass this information on to the day shift team.   Antonieta Pert, RN

## 2021-05-13 NOTE — Progress Notes (Signed)
Nutrition Brief Note  Chart reviewed. Pt transitioning to comfort care today at 12:00 pm when family is able to attend. No further nutrition interventions planned at this time.  Please re-consult as needed.    Gustavus Bryant, MS, RD, LDN Inpatient Clinical Dietitian Please see AMiON for contact information.

## 2021-05-13 NOTE — Progress Notes (Signed)
Pt transitioned to comfort care, family at bedside. Pt asystole on monitor, heart and lungs auscultated no sounds noted. Pt pronounced by myself Deboraha Sprang, RN and Neta Mends, RN  at [phone]. Verbal order to pronounce by Dr. Lynetta Mare. Dr. Lynetta Mare notified of time of death.

## 2021-05-13 NOTE — Progress Notes (Signed)
Spoke to nursing-  CRRT clotted off last night due to access issue.  Then a call was received from the family stating their wishes to transition patient to comfort care today.  So, will leave off of CRRT ad renal will sign off.  Call if plan changes or if we can be of any other assistance   Louis Meckel

## 2021-05-13 NOTE — Progress Notes (Signed)
[place] Progress Note Patient Name: Kristopher [name] DOB: Aug 08, 1946 MRN: [id number]   Date of Service  2021-05-08  HPI/Events of Note  Patient is hypotensive on maximum dose  of Norepinephrine  gtt and Vasopressin gtt at 0.03 mcg.  eICU Interventions  Vasopressin gtt increased to 0.04 mcg and Phenylephrine gtt ordered.        Kerry Kass Kaevon Cotta May 08, 2021, 5:14 AM

## 2021-05-13 NOTE — Progress Notes (Signed)
Palliative:  Chart review completed.  Kristopher [name] is transitioning to comfort care with compassionate extubation today.  Conference with CCM attending.  PMT available to assist with EOL needs as requested.   Plan:  Compassionate extubation 7/21. Comfort care. Anticipate in hospital death.   No charge.  Quinn Axe, NP Palliative Medicine Team  Team Phone [phone]

## 2021-05-13 DEATH — deceased

## 2021-06-13 NOTE — Discharge Summary (Signed)
DEATH SUMMARY   Patient Details  Name: Kristopher [name] MRN: [id number] DOB: Oct 14, 1945  Admission/Discharge Information   Admit Date:  04/30/21  Date of Death: Date of Death: 2021-05-10  Time of Death: Time of Death: 1338/01/06  Length of Stay: Dec 29, 2022  Referring Physician: Susy Frizzle, MD   Reason(s) for Hospitalization  Acute kidney injury  Diagnoses  Preliminary cause of death: ARDS Secondary Diagnoses (including complications and co-morbidities):  Active Problems:   Acute renal failure (ARF) (HCC)   Encounter for central line placement Septic shock  Aspiration pneumonia with ARDS Acute hypoxic and hypercapnic respiratory failure. Anasarca Malnutrition.  Brief Hospital Course (including significant findings, care, treatment, and services provided and events leading to death)  Kristopher [name] is a 75 y.o. year old male who initially presented following an aspiration event and was subsequently discharge to [facility] with plan for tracheostomy. Unfortunately, there he developed worsening multifactorial renal failure, likely as a consequence of his initial hospitalization which was accompanied by worsening hypoxia. On return to the ICU he was found to be markedly volume overloaded. CRRT was initiated but we were unable to effectively pull fluid due to hypotension requiring vasopressors. Thus the patient developed a spiral of worsening respiratory failure and shock. We discussed the situation with the family who agreed to a transition to comfort care and compassionate extubation.   Pertinent Labs and Studies  Significant Diagnostic Studies DG Abd 1 View  Result Date: 04/21/2021 CLINICAL DATA:  Nausea and vomiting. EXAM: ABDOMEN - 1 VIEW COMPARISON:  Plain films of the abdomen and single view of the chest 04/17/2021. FINDINGS: Feeding tube is in place with the tip in the distal body of the stomach. Bowel gas pattern is nonobstructive. Multiple surgical clips are seen in the abdomen. Lung bases  demonstrate airspace disease which appears improved compared to the prior chest film. IMPRESSION: No acute abnormality abdomen. Feeding tube tip is in the distal body of the stomach. Partial visualization of bibasilar airspace disease which appears improved. Electronically Signed   By: Inge Rise M.D.   On: 04/21/2021 17:07   US RENAL  Result Date: 04/21/2021 CLINICAL DATA:  Worsening renal function EXAM: RENAL / URINARY TRACT ULTRASOUND COMPLETE COMPARISON:  CT 10/02/2019 FINDINGS: Right Kidney: Renal measurements: 14.3 x 6.8 x 6.4 cm (volume = 330 cm^3). Echogenicity within normal limits. No hydronephrosis. No greater than 10 renal cysts visualized measuring up to 2.8 cm. Left Kidney: Surgically absent. Bladder: Not visualized. Other: None. IMPRESSION: 1. Multiple right renal cysts, no hydronephrosis. 2. Left kidney surgically absent. Electronically Signed   By: Dahlia Bailiff MD   On: 04/21/2021 18:49   DG CHEST PORT 1 VIEW  Result Date: 04/29/2021 CLINICAL DATA:  Pulmonary edema. EXAM: PORTABLE CHEST 1 VIEW COMPARISON:  Radiograph earlier today. FINDINGS: Endotracheal tube tip is 3.2 cm from the carina. Enteric tube is in place tip below the diaphragm not included in the field of view. Bilateral interstitial and alveolar opacities with slightly shifting pattern from earlier today, may be due to differences in rotation. Slight improvement in the right lung and worsening on the left. Otherwise no significant interval change. No pneumothorax. Small bilateral pleural effusions suspected. Stable heart size and mediastinal contours. IMPRESSION: 1. Bilateral interstitial and alveolar opacities with slightly shifting pattern from earlier today, may be due to differences in rotation. Slight improvement in the right lung and worsening on the left. 2. Small bilateral pleural effusions. Electronically Signed   By: Aurther Loft.D.  On: 04/29/2021 19:38   DG CHEST PORT 1 VIEW  Result Date:  04/29/2021 CLINICAL DATA:  75 year old male with history of respiratory failure. EXAM: PORTABLE CHEST 1 VIEW COMPARISON:  Chest x-ray 04/27/2021. FINDINGS: An endotracheal tube is in place with tip 4.2 cm above the carina. There is a right-sided internal jugular central venous catheter with tip terminating in the mid superior vena cava. A feeding tube is seen extending into the abdomen, however, the tip of the feeding tube extends below the lower margin of the image. Lung volumes are low. Widespread but patchy areas of interstitial prominence and airspace consolidation are noted throughout the lungs bilaterally (right greater than left), with significantly worsened aeration throughout the right lung. More dense airspace consolidation in the left base also noted. Small left pleural effusion. No definite right pleural effusion. No pneumothorax. No evidence of pulmonary edema. Heart size is normal. The patient is rotated to the left on today's exam, resulting in distortion of the mediastinal contours and reduced diagnostic sensitivity and specificity for mediastinal pathology. Atherosclerotic calcifications in the thoracic aorta. IMPRESSION: 1. Support apparatus, as above. 2. Worsening multilobar bilateral pneumonia, with small left pleural effusion. 3. Aortic atherosclerosis. Electronically Signed   By: Vinnie Langton M.D.   On: 04/29/2021 08:01   DG Chest Port 1 View  Result Date: 04/27/2021 CLINICAL DATA:  Acute respiratory failure EXAM: PORTABLE CHEST 1 VIEW COMPARISON:  04/26/2021, 04/23/2021 FINDINGS: Endotracheal tube tip about 2.9 cm superior to carina. 2 esophageal tubes are present, tips below the diaphragm but incompletely visualized. Right IJ central venous catheter tip over the SVC origin. Slightly improved aeration since yesterday's radiograph. Residual interstitial opacity and probable small effusions. Stable cardiomediastinal silhouette with aortic atherosclerosis. No pneumothorax. IMPRESSION: 1.  Support lines and tubes as above. 2. Slight decreased pulmonary infiltrates or edema since yesterday's radiograph. Suspect that there are small pleural effusions. Electronically Signed   By: Donavan Foil M.D.   On: 04/27/2021 22:49   DG Chest Port 1 View  Result Date: 04/26/2021 CLINICAL DATA:  Acute on chronic respiratory failure. EXAM: PORTABLE CHEST 1 VIEW COMPARISON:  Single-view of the chest 04/23/2021. FINDINGS: Support tubes and lines are unchanged. Bilateral airspace disease versus. There has been some worsening aeration left mid and right lower lung zones. Heart size is normal. Aortic atherosclerosis. No pneumothorax or fluid. IMPRESSION: Support tubes and lines are unchanged and project in good position. Mild worsening of airspace disease in the right lower and left mid lung zones. Electronically Signed   By: Inge Rise M.D.   On: 04/26/2021 13:37   DG Chest Port 1 View  Result Date: 04/26/2021 CLINICAL DATA:  respiratory failure and hypoxia. EXAM: PORTABLE CHEST 1 VIEW COMPARISON:  Earlier today FINDINGS: Right sided central venous catheter with tip in the projection of the SVC is unchanged. ET tube tip is above the carina. There is a nasogastric tube and feeding tube, both with tips below the level of the GE junction. Stable cardiomediastinal contours. Diffuse bilateral interstitial and airspace opacities are unchanged from previous exam. No pneumothorax identified. IMPRESSION: 1. No change in aeration to the lungs compared with previous exam. 2. Stable support apparatus. Electronically Signed   By: Kerby Moors M.D.   On: 04/26/2021 13:11   DG Chest Port 1 View  Result Date: 04/23/2021 CLINICAL DATA:  Intubation, hypoxia EXAM: PORTABLE CHEST 1 VIEW COMPARISON:  Portable exam 0538 hours compared to 04/13/2021 FINDINGS: Feeding tube and nasogastric tube extends into stomach. RIGHT jugular central  venous catheter with tip projecting over SVC. Tip of endotracheal tube projects 5.7 cm  above carina. Stable heart size mediastinal contours. Atherosclerotic calcification aorta. Diffuse BILATERAL pulmonary infiltrates unchanged, remaining greatest in mid RIGHT lung. No pleural effusion or pneumothorax. IMPRESSION: Persistent BILATERAL pulmonary infiltrates unchanged, question multifocal pneumonia versus pulmonary edema. Electronically Signed   By: Lavonia Dana M.D.   On: 04/23/2021 08:32   DG CHEST PORT 1 VIEW  Result Date: 05/06/2021 Rhae Lerner     04/24/2021  5:45 PM Central Venous Catheter Insertion Procedure Note MELISSA CLEMENTE [id number] 1945-10-19 Date:05/11/2021 Time:5:44 PM Provider Performing:Stephanie Jerilynn Mages Ayesha Rumpf Procedure: Insertion of Non-tunneled Central Venous Catheter(36556)with US guidance [id number])  Indication(s) Difficult access and Hemodialysis Consent Unable to obtain consent due to emergent nature of procedure. Anesthesia Topical only with 1% lidocaine Timeout Verified patient identification, verified procedure, site/side was marked, verified correct patient position, special equipment/implants available, medications/allergies/relevant history reviewed, required imaging and test results available. Sterile Technique Maximal sterile technique including full sterile barrier drape, hand hygiene, sterile gown, sterile gloves, mask, hair covering, sterile ultrasound probe cover (if used). Procedure Description Area of catheter insertion was cleaned with chlorhexidine and draped in sterile fashion.   With real-time ultrasound guidance a HD catheter (Trialysis) was placed into the right internal jugular vein.  Nonpulsatile blood flow and easy flushing noted in all ports.  The catheter was sutured in place and sterile dressing applied. Complications/Tolerance None; patient tolerated the procedure well. Chest X-ray is ordered to verify placement for internal jugular or subclavian cannulation. EBL Minimal Specimen(s) None The entire procedure was supervised/proctored by Erskine Emery, MD.  Lestine Mount, PA-C [facility] 04/29/2021 5:45 PM Please see [email or web address] for pager details.   DG CHEST PORT 1 VIEW  Result Date: 04/28/2021 CLINICAL DATA:  Respiratory failure. EXAM: PORTABLE CHEST 1 VIEW COMPARISON:  04/17/2021 FINDINGS: The endotracheal tube tip is above the carina. There is a feeding tube with tip below the field of GE junction. Stable cardiomediastinal contours. Diffuse interstitial and airspace disease is unchanged from previous exam. Bilateral pleural effusions are noted and appear decreased in volume from the previous study. IMPRESSION: 1. Stable support apparatus. 2. Decrease in volume of bilateral pleural effusions. 3. No change in aeration to the lungs. Electronically Signed   By: Kerby Moors M.D.   On: 04/21/2021 09:38   DG Abd Portable 1V  Result Date: 04/28/2021 CLINICAL DATA:  Check gastric catheter placement EXAM: PORTABLE ABDOMEN - 1 VIEW COMPARISON:  Film from earlier in the same day. FINDINGS: Weighted feeding catheter is again noted in the distal stomach. Gastric catheter has been placed as well extending into the stomach. Scattered large and small bowel gas is noted. Postsurgical changes are seen. IMPRESSION: Gastric catheter and weighted feeding catheter within the stomach. Electronically Signed   By: Inez Catalina M.D.   On: 05/05/2021 22:40   DG Abd Portable 1V  Result Date: 05/03/2021 CLINICAL DATA:  Abdominal distension, ventilator dependent EXAM: PORTABLE ABDOMEN - 1 VIEW COMPARISON:  Portable exam 1556 hours compared to 04/21/2021 FINDINGS: Tip of feeding tube projects over gastric antrum. Gaseous distention of stomach with some gas present in the rectosigmoid colon as well. No bowel wall thickening or small bowel distention seen. 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 IMPRESSION: Nonobstructive bowel gas pattern. Electronically Signed   By: Lavonia Dana M.D.   On: 04/24/2021 16:37    Microbiology No results  found for this or any previous  visit (from the past 240 hour(s)).  Lab Basic Metabolic Panel: No results for input(s): NA, K, CL, CO2, GLUCOSE, BUN, CREATININE, CALCIUM, MG, PHOS in the last 168 hours. Liver Function Tests: No results for input(s): AST, ALT, ALKPHOS, BILITOT, PROT, ALBUMIN in the last 168 hours. No results for input(s): LIPASE, AMYLASE in the last 168 hours. No results for input(s): AMMONIA in the last 168 hours. CBC: No results for input(s): WBC, NEUTROABS, HGB, HCT, MCV, PLT in the last 168 hours. Cardiac Enzymes: No results for input(s): CKTOTAL, CKMB, CKMBINDEX, TROPONINI in the last 168 hours. Sepsis Labs: No results for input(s): PROCALCITON, WBC, LATICACIDVEN in the last 168 hours.  Procedures/Operations  Central line placement. CRRT. Mechanical ventilation.    Jannifer Fischler 05/21/2021, 7:33 AM
